# Patient Record
Sex: Male | Born: 1960
Health system: Southern US, Community
[De-identification: ages and names within clinical notes are randomized; demographics above are authoritative.]

## PROBLEM LIST (undated history)

## (undated) DIAGNOSIS — E663 Overweight: Secondary | ICD-10-CM

## (undated) DIAGNOSIS — Z8614 Personal history of Methicillin resistant Staphylococcus aureus infection: Secondary | ICD-10-CM

## (undated) DIAGNOSIS — F419 Anxiety disorder, unspecified: Secondary | ICD-10-CM

## (undated) DIAGNOSIS — M25861 Other specified joint disorders, right knee: Secondary | ICD-10-CM

## (undated) DIAGNOSIS — N200 Calculus of kidney: Secondary | ICD-10-CM

## (undated) DIAGNOSIS — Z87442 Personal history of urinary calculi: Secondary | ICD-10-CM

## (undated) DIAGNOSIS — R972 Elevated prostate specific antigen [PSA]: Secondary | ICD-10-CM

## (undated) DIAGNOSIS — R911 Solitary pulmonary nodule: Secondary | ICD-10-CM

## (undated) DIAGNOSIS — R351 Nocturia: Secondary | ICD-10-CM

## (undated) DIAGNOSIS — M1990 Unspecified osteoarthritis, unspecified site: Secondary | ICD-10-CM

## (undated) DIAGNOSIS — M75100 Unspecified rotator cuff tear or rupture of unspecified shoulder, not specified as traumatic: Secondary | ICD-10-CM

## (undated) DIAGNOSIS — R569 Unspecified convulsions: Secondary | ICD-10-CM

## (undated) DIAGNOSIS — E785 Hyperlipidemia, unspecified: Secondary | ICD-10-CM

## (undated) DIAGNOSIS — N400 Enlarged prostate without lower urinary tract symptoms: Secondary | ICD-10-CM

## (undated) HISTORY — DX: Overweight: E66.3

## (undated) HISTORY — DX: Nocturia: R35.1

## (undated) HISTORY — PX: LITHOTRIPSY: SUR834

## (undated) HISTORY — PX: SHOULDER SURGERY: SHX246

## (undated) HISTORY — DX: Benign prostatic hyperplasia without lower urinary tract symptoms: N40.0

## (undated) HISTORY — DX: Hyperlipidemia, unspecified: E78.5

## (undated) HISTORY — PX: COLONOSCOPY: SHX174

## (undated) HISTORY — DX: Elevated prostate specific antigen (PSA): R97.20

---

## 2004-03-16 ENCOUNTER — Emergency Department: Payer: Self-pay | Admitting: Emergency Medicine

## 2004-03-22 ENCOUNTER — Ambulatory Visit: Payer: Self-pay | Admitting: Urology

## 2004-03-23 ENCOUNTER — Other Ambulatory Visit: Payer: Self-pay

## 2004-03-23 ENCOUNTER — Ambulatory Visit: Payer: Self-pay | Admitting: Urology

## 2004-03-30 ENCOUNTER — Ambulatory Visit: Payer: Self-pay | Admitting: Urology

## 2004-04-21 ENCOUNTER — Ambulatory Visit: Payer: Self-pay | Admitting: Urology

## 2005-11-30 ENCOUNTER — Ambulatory Visit: Payer: Self-pay | Admitting: Internal Medicine

## 2005-11-30 ENCOUNTER — Encounter: Payer: Self-pay | Admitting: Family Medicine

## 2006-01-29 DIAGNOSIS — Z8614 Personal history of Methicillin resistant Staphylococcus aureus infection: Secondary | ICD-10-CM

## 2006-01-29 HISTORY — DX: Personal history of Methicillin resistant Staphylococcus aureus infection: Z86.14

## 2006-04-02 ENCOUNTER — Ambulatory Visit: Payer: Self-pay | Admitting: Family Medicine

## 2007-01-30 DIAGNOSIS — Z8614 Personal history of Methicillin resistant Staphylococcus aureus infection: Secondary | ICD-10-CM

## 2007-01-30 HISTORY — DX: Personal history of Methicillin resistant Staphylococcus aureus infection: Z86.14

## 2007-07-28 ENCOUNTER — Ambulatory Visit: Payer: Self-pay | Admitting: Family Medicine

## 2007-07-28 DIAGNOSIS — H6120 Impacted cerumen, unspecified ear: Secondary | ICD-10-CM

## 2007-07-28 DIAGNOSIS — J0100 Acute maxillary sinusitis, unspecified: Secondary | ICD-10-CM

## 2007-07-29 ENCOUNTER — Encounter: Payer: Self-pay | Admitting: Family Medicine

## 2008-02-06 ENCOUNTER — Emergency Department: Payer: Self-pay | Admitting: Emergency Medicine

## 2008-02-09 ENCOUNTER — Ambulatory Visit: Payer: Self-pay | Admitting: Urology

## 2008-02-23 ENCOUNTER — Ambulatory Visit: Payer: Self-pay | Admitting: Urology

## 2008-03-18 ENCOUNTER — Ambulatory Visit: Payer: Self-pay | Admitting: Urology

## 2008-04-20 ENCOUNTER — Ambulatory Visit: Payer: Self-pay | Admitting: Urology

## 2009-02-10 ENCOUNTER — Ambulatory Visit: Payer: Self-pay | Admitting: Orthopedic Surgery

## 2009-02-17 ENCOUNTER — Ambulatory Visit: Payer: Self-pay | Admitting: Orthopedic Surgery

## 2009-09-06 ENCOUNTER — Encounter (INDEPENDENT_AMBULATORY_CARE_PROVIDER_SITE_OTHER): Payer: Self-pay | Admitting: *Deleted

## 2010-02-28 NOTE — Letter (Signed)
Summary: Nadara Eaton letter  [place] at [facility]  [address] [place], [place] [address]   Phone: [phone]  Fax: [phone]       09/06/2009 MRN: [id number]  [facility] [address] [place], [place]  [address]  Dear Mr. Irena [facility] [place], and [facility] announce the retirement of Arta Silence, M.D., from full-time practice at the [facility] office effective July 28, 2009 and his plans of returning part-time.  It is important to Dr. Hetty Ely and to our practice that you understand that [facility] has seven physicians in our office for your health care needs.  We will continue to offer the same exceptional care that you have today.    Dr. Hetty Ely has spoken to many of you about his plans for retirement and returning part-time in the fall.   We will continue to work with you through the transition to schedule appointments for you in the office and meet the high standards that [place] is committed to.   Again, it is with great pleasure that we share the news that Dr. Hetty Ely will return to [facility] at [facility] in October of 2011 with a reduced schedule.    If you have any questions, or would like to request an appointment with one of our physicians, please call us at [phone] and press the option for Scheduling an appointment.  We take pleasure in providing you with excellent patient care and look forward to seeing you at your next office visit.  Our [facility] Physicians are:  Tillman Abide, M.D. Laurita Quint, M.D. Roxy Manns, M.D. Kerby Nora, M.D. Hannah Beat, M.D. Ruthe Mannan, M.D. We proudly welcomed Raechel Ache, M.D. and Eustaquio Boyden, M.D. to the practice in July/August 2011.  Sincerely,  [facility]

## 2010-04-04 IMAGING — CR DG ABDOMEN 1V
1 series · 1 of 1 positions shown · non-contrast
Comparison: none

REASON FOR EXAM: flank pain
COMMENTS:

[view not recorded]
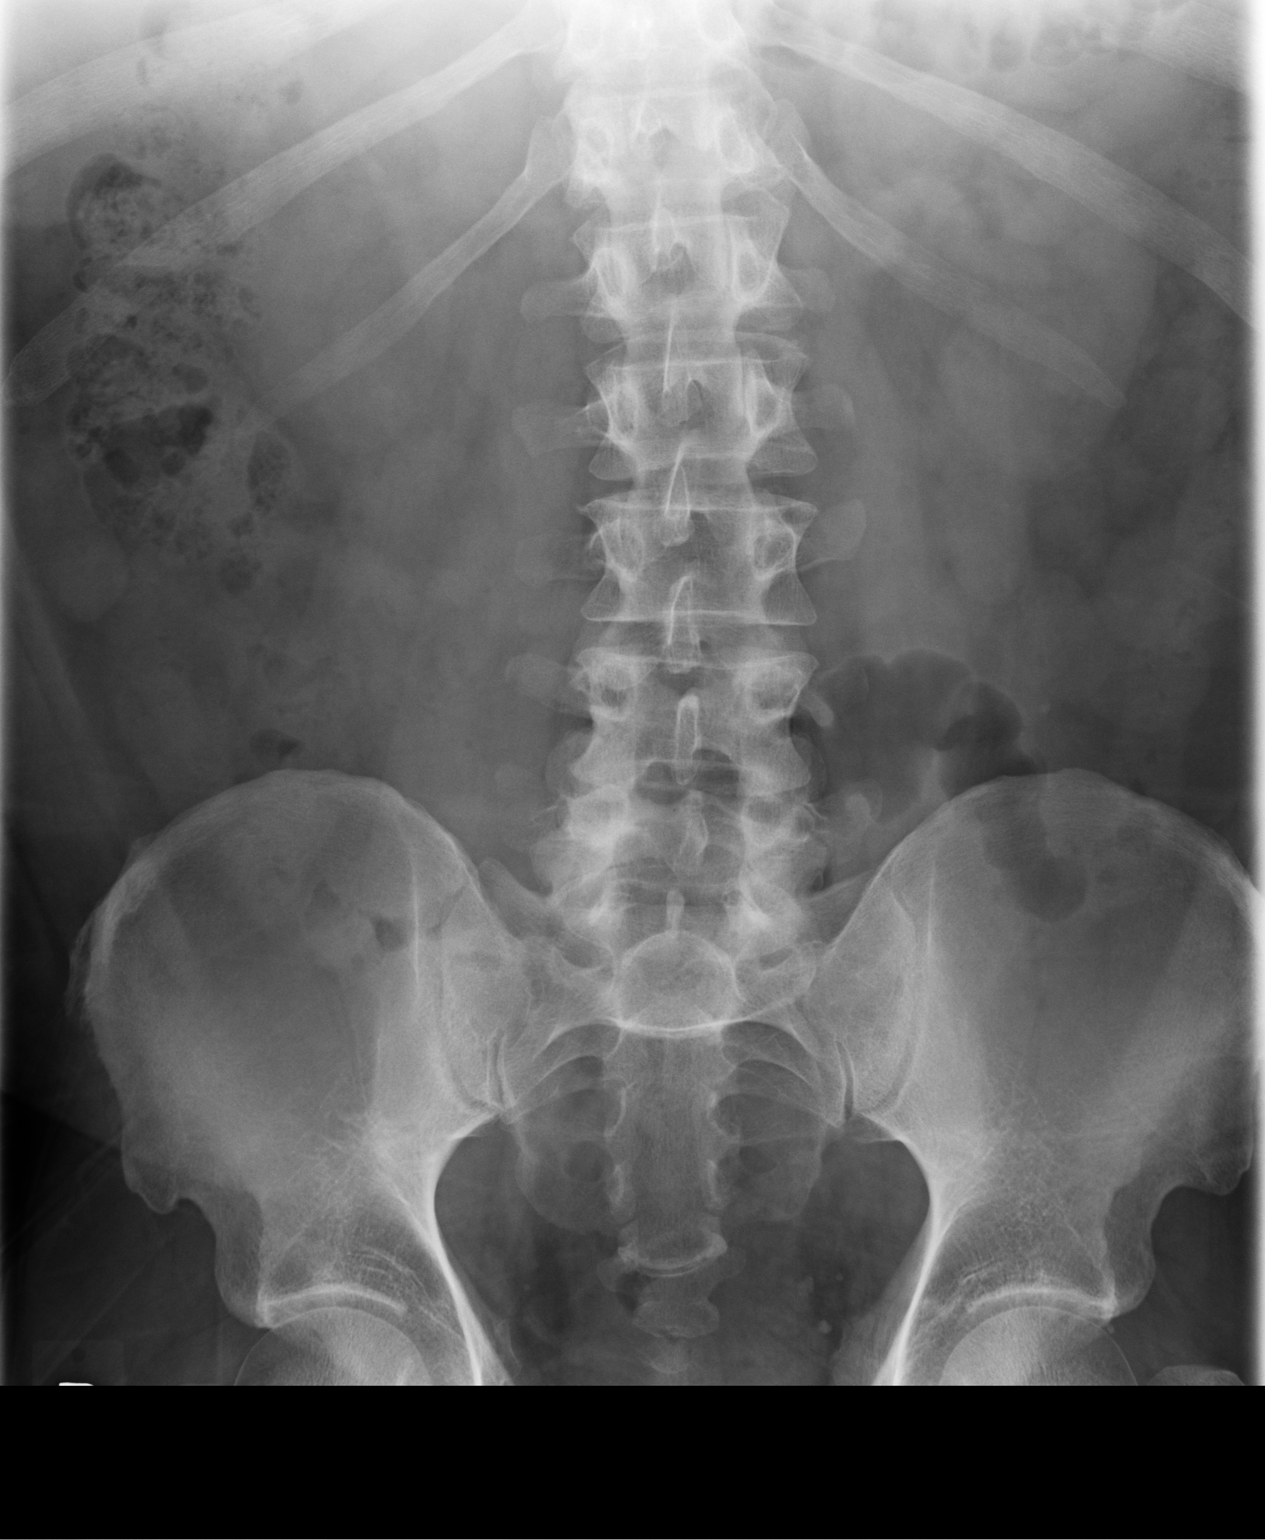

[1 of 1 positions shown; findings below may reference images not displayed]

PROCEDURE:     MDR - MDR KIDNEY URETER BLADDER  - February 09, 2008  [DATE]

RESULT:     The kidneys appear to be symmetrical and normal in size. No
renal calcifications are identified on routine radiography. There is a 6 mm
calcification in the LEFT pelvis suspicious for a distal LEFT ureteral
stone. A few phleboliths are noted in the pelvis bilaterally.
IMPRESSION: 1.     Possible distal LEFT ureteral stone.

## 2012-01-30 LAB — HM COLONOSCOPY

## 2012-07-31 ENCOUNTER — Ambulatory Visit: Payer: Self-pay | Admitting: Unknown Physician Specialty

## 2013-05-04 ENCOUNTER — Emergency Department (HOSPITAL_COMMUNITY): Payer: BC Managed Care – PPO

## 2013-05-04 ENCOUNTER — Observation Stay (HOSPITAL_COMMUNITY)
Admission: EM | Admit: 2013-05-04 | Discharge: 2013-05-05 | Disposition: A | Discharge status: Home / Self Care | Payer: BC Managed Care – PPO | Attending: Internal Medicine | Admitting: Internal Medicine

## 2013-05-04 ENCOUNTER — Encounter (HOSPITAL_COMMUNITY): Payer: Self-pay | Admitting: Emergency Medicine

## 2013-05-04 DIAGNOSIS — Z87442 Personal history of urinary calculi: Secondary | ICD-10-CM | POA: Diagnosis not present

## 2013-05-04 DIAGNOSIS — S4980XA Other specified injuries of shoulder and upper arm, unspecified arm, initial encounter: Secondary | ICD-10-CM | POA: Insufficient documentation

## 2013-05-04 DIAGNOSIS — R748 Abnormal levels of other serum enzymes: Secondary | ICD-10-CM | POA: Diagnosis present

## 2013-05-04 DIAGNOSIS — W19XXXA Unspecified fall, initial encounter: Secondary | ICD-10-CM | POA: Diagnosis not present

## 2013-05-04 DIAGNOSIS — S46909A Unspecified injury of unspecified muscle, fascia and tendon at shoulder and upper arm level, unspecified arm, initial encounter: Secondary | ICD-10-CM | POA: Diagnosis not present

## 2013-05-04 DIAGNOSIS — R569 Unspecified convulsions: Secondary | ICD-10-CM | POA: Diagnosis present

## 2013-05-04 DIAGNOSIS — D72829 Elevated white blood cell count, unspecified: Secondary | ICD-10-CM | POA: Diagnosis not present

## 2013-05-04 DIAGNOSIS — R32 Unspecified urinary incontinence: Secondary | ICD-10-CM | POA: Diagnosis not present

## 2013-05-04 DIAGNOSIS — S01502A Unspecified open wound of oral cavity, initial encounter: Secondary | ICD-10-CM | POA: Insufficient documentation

## 2013-05-04 DIAGNOSIS — R55 Syncope and collapse: Secondary | ICD-10-CM | POA: Diagnosis not present

## 2013-05-04 DIAGNOSIS — X838XXA Intentional self-harm by other specified means, initial encounter: Secondary | ICD-10-CM | POA: Insufficient documentation

## 2013-05-04 DIAGNOSIS — S4992XA Unspecified injury of left shoulder and upper arm, initial encounter: Secondary | ICD-10-CM | POA: Diagnosis present

## 2013-05-04 HISTORY — DX: Calculus of kidney: N20.0

## 2013-05-04 LAB — ETHANOL: Alcohol, Ethyl (B): <11 mg/dL (ref 0–11)

## 2013-05-04 LAB — COMPREHENSIVE METABOLIC PANEL
ALT: 13 U/L (ref 0–53)
AST: 20 U/L (ref 0–37)
Albumin: 3.6 g/dL (ref 3.5–5.2)
Alkaline Phosphatase: 74 U/L (ref 39–117)
BUN: 20 mg/dL (ref 6–23)
CALCIUM: 9.2 mg/dL (ref 8.4–10.5)
CO2: 25 meq/L (ref 19–32)
CREATININE: 1.06 mg/dL (ref 0.50–1.35)
Chloride: 103 mEq/L (ref 96–112)
GFR, EST NON AFRICAN AMERICAN: 79 mL/min — AB (ref >90)
Glucose, Bld: 125 mg/dL — ABNORMAL HIGH (ref 70–99)
Potassium: 4.4 mEq/L (ref 3.7–5.3)
Sodium: 141 mEq/L (ref 137–147)
TOTAL PROTEIN: 7.3 g/dL (ref 6.0–8.3)
Total Bilirubin: 0.3 mg/dL (ref 0.3–1.2)

## 2013-05-04 LAB — CBC
HEMATOCRIT: 39.1 % (ref 39.0–52.0)
HEMOGLOBIN: 13.6 g/dL (ref 13.0–17.0)
MCH: 30.4 pg (ref 26.0–34.0)
MCHC: 34.8 g/dL (ref 30.0–36.0)
MCV: 87.5 fL (ref 78.0–100.0)
Platelets: 193 10*3/uL (ref 150–400)
RBC: 4.47 MIL/uL (ref 4.22–5.81)
RDW: 12.5 % (ref 11.5–15.5)
WBC: 11.7 10*3/uL — ABNORMAL HIGH (ref 4.0–10.5)

## 2013-05-04 LAB — CBG MONITORING, ED: GLUCOSE-CAPILLARY: 111 mg/dL — AB (ref 70–99)

## 2013-05-04 LAB — CK: CK TOTAL: 444 U/L — AB (ref 7–232)

## 2013-05-04 MED ORDER — IBUPROFEN 400 MG PO TABS
400.0000 mg | ORAL_TABLET | Freq: Once | ORAL | Status: AC
  Administered 2013-05-04: 400 mg via ORAL
  Filled 2013-05-04: qty 2

## 2013-05-04 MED ORDER — ACETAMINOPHEN 325 MG PO TABS
650.0000 mg | ORAL_TABLET | ORAL | Status: DC | PRN
  Administered 2013-05-04 – 2013-05-05 (×3): 650 mg via ORAL
  Filled 2013-05-04 (×3): qty 2

## 2013-05-04 MED ORDER — HYDROCODONE-ACETAMINOPHEN 5-325 MG PO TABS: 1.0000 | ORAL_TABLET | ORAL | Status: DC | PRN

## 2013-05-04 MED ORDER — ENOXAPARIN SODIUM 40 MG/0.4ML ~~LOC~~ SOLN
40.0000 mg | Freq: Every day | SUBCUTANEOUS | Status: DC
  Administered 2013-05-04: 40 mg via SUBCUTANEOUS
  Filled 2013-05-04 (×2): qty 0.4

## 2013-05-04 MED ORDER — LORAZEPAM 2 MG/ML IJ SOLN: 1.0000 mg | INTRAMUSCULAR | Status: DC | PRN

## 2013-05-04 NOTE — Consult Note (Signed)
Neurology Consultation Reason for Consult: Seizure Referring Physician: Darlyn ChamberZackowski, S  CC: "I do not remember what happened"  History is obtained from:patient, referring physician  HPI: Mark Hernandez is a 53 y.o. male with no significant past medical history who presents with new onset episode concerning for seizure. He states that he remembers feeling flushed and then the next thing that he knows EMS was there with him. Apparently one of his coworkers found him confused and it was that coworker who called EMS.  He did lose urinary continence and has a bite on the side of his tongue. He somehow injured his left shoulder.    ROS: A 14 point ROS was performed and is negative except as noted in the HPI.  Past Medical History  Diagnosis Date  . Kidney stone     Family History: No history of seizures, sister with infantile brain tumor  Social History: Tob: negative  Exam: Current vital signs: BP 122/84  Pulse 105  Temp(Src) 98.7 F (37.1 C) (Oral)  Resp 18  Ht 5\' 9"  (1.753 m)  Wt 103.103 kg (227 lb 4.8 oz)  BMI 33.55 kg/m2  SpO2 97% Vital signs in last 24 hours: Temp:  [98.3 F (36.8 C)-99 F (37.2 C)] 98.7 F (37.1 C) (04/06 2258) Pulse Rate:  [95-106] 105 (04/06 2258) Resp:  [14-18] 18 (04/06 2258) BP: (118-143)/(82-94) 122/84 mmHg (04/06 2258) SpO2:  [93 %-100 %] 97 % (04/06 2258) Weight:  [103.103 kg (227 lb 4.8 oz)] 103.103 kg (227 lb 4.8 oz) (04/06 2258)  General: in bed, NAD CV: regular rate and rhythm Mental Status: Patient is awake, alert, oriented to person, place, month, year, and situation. Immediate and remote memory are intact. Patient is able to give a clear and coherent history. No signs of aphasia or neglect Cranial Nerves: II: Visual Fields are full. Pupils are equal, round, and reactive to light.  Discs are without papilledema. III,IV, VI: EOMI without ptosis or diploplia.  V: Facial sensation is symmetric to temperature VII: Facial movement is  symmetric.  VIII: hearing is intact to voice X: Uvula elevates symmetrically XI: Shoulder shrug is symmetric. XII: tongue is midline without atrophy or fasciculations.  Motor: Tone is normal. Bulk is normal. 5/5 strength was present in all four extremities.  Sensory: Sensation is symmetric to light touch and temperature in the arms and legs. Deep Tendon Reflexes: 2+ and symmetric in the biceps and patellae.  Plantars: Toes are downgoing bilaterally.  Cerebellar: FNF and HKS are intact bilaterally Gait: Not tested due to multiple medical monitors in ED setting      I have reviewed labs in epic and the results pertinent to this consultation are: CBC-mild leukocytosis CMP-unremarkable  I have reviewed the images obtained:CT head-negative  Impression: 53 year old male with first ever seizure. He has been admitted for further workup. The cardiac syncope could conceivably cause him to lose consciousness, with tongue biting and postictal state, I think that epileptic seizure is  much more likely.  I do not typically recommend starting antiepileptic therapy for first-time seizure. I have discussed with him seizure cautions including  Patient is unable to drive, operate heavy machinery, standing over open flames, perform activities at heights or participate in water activities until release by outpatient physician. This was discussed with the patient who expressed understanding.    Recommendations: 1) EEG 2) MRI brain 3) will continue to follow   Ritta SlotMcNeill Lindell Tussey, MD [facility] [phone]  If 7pm- 7am, please page neurology on call  as listed in [place].

## 2013-05-04 NOTE — ED Notes (Signed)
Admitting MD at BS.  

## 2013-05-04 NOTE — H&P (Signed)
[facility] History and Physical  Patient: Mark Hernandez  ZOX:[id number]  DOB: 02-08-1960  DOS: the patient was seen and examined on 05/04/2013 PCP: No PCP Per Patient  Chief Complaint: Unresponsiveness  HPI: Mark Hernandez is a 53 y.o. male with Past medical history of renal stone. Patient presented with an episode of unresponsiveness. He mentions that since last 3 days he has been under a lot of stress at work and has had sleepless nights. Today while he was sitting at his desk he felt warm, and then felt the need to urinate, but he was walking he probably lost consciousness. The next thing that he remembers is he was on the ground sitting up and people were surrounding him asking question if he was okay. He does not remember falling down. He denies any dizziness lightheadedness vertigo focal neurological deficit chest pain palpitation shortness of breath prior to the event. He does not take any medication for prescription. He denies any similar episode in the past. He had tongue bite with some incontinence of urine. He denies any focal neurological deficit at present. He denies any drug abuse or alcohol abuse he denies any prior head injury or surgery.   The patient is coming from home. And at her baseline Independent for most of his  ADL.  Review of Systems: as mentioned in the history of present illness.  A Comprehensive review of the other systems is negative.  Past Medical History  Diagnosis Date  . Kidney stone    Past Surgical History  Procedure Laterality Date  . Lithotripsy    . Shoulder surgery     Social History:  reports that he has never smoked. He does not have any smokeless tobacco history on file. He reports that he does not drink alcohol. His drug history is not on file.  Allergies  Allergen Reactions  . Penicillins Other (See Comments)    REACTION: as child    History reviewed. No pertinent family history.  Prior to Admission medications   Medication  Sig Start Date End Date Taking? Authorizing Provider  ibuprofen (ADVIL,MOTRIN) 200 MG tablet Take 400 mg by mouth every 6 (six) hours as needed for moderate pain.   Yes Historical Provider, MD  Multiple Vitamin (MULTIVITAMIN WITH MINERALS) TABS tablet Take 1 tablet by mouth daily.   Yes Historical Provider, MD  HYDROcodone-acetaminophen (NORCO/VICODIN) 5-325 MG per tablet Take 1-2 tablets by mouth every 4 (four) hours as needed. 05/04/13   Toney Sang, MD    Physical Exam: Filed Vitals:   05/04/13 2020 05/04/13 2051 05/04/13 2100 05/04/13 2130  BP: 141/94 118/82 120/85 125/90  Pulse: 106 102 106 100  Temp: 99 F (37.2 C)     TempSrc: Oral     Resp: 18 16    SpO2: 96% 94% 93% 94%    General: Alert, Awake and Oriented to Time, Place and Person. Appear in mild distress Eyes: PERRL ENT: Oral Mucosa clear moist. Neck: no JVD Cardiovascular: S1 and S2 Present, no Murmur, Peripheral Pulses Present Respiratory: Bilateral Air entry equal and Decreased, Clear to Auscultation,  no Crackles,no wheezes Abdomen: Bowel Sound Present, Soft and Non tender Skin: no Rash Extremities: no Pedal edema, no calf tenderness, left shoulder tenderness on abduction and external rotation  Neurologic: Grossly Unremarkable.  Labs on Admission:  CBC:  Recent Labs Lab 05/04/13 1652  WBC 11.7*  HGB 13.6  HCT 39.1  MCV 87.5  PLT 193    CMP     Component  Value Date/Time   NA 141 05/04/2013 1652   K 4.4 05/04/2013 1652   CL 103 05/04/2013 1652   CO2 25 05/04/2013 1652   GLUCOSE 125* 05/04/2013 1652   BUN 20 05/04/2013 1652   CREATININE 1.06 05/04/2013 1652   CALCIUM 9.2 05/04/2013 1652   PROT 7.3 05/04/2013 1652   ALBUMIN 3.6 05/04/2013 1652   AST 20 05/04/2013 1652   ALT 13 05/04/2013 1652   ALKPHOS 74 05/04/2013 1652   BILITOT 0.3 05/04/2013 1652   GFRNONAA 79* 05/04/2013 1652   GFRAA >90 05/04/2013 1652    No results found for this basename: LIPASE, AMYLASE,  in the last 168 hours No results found for this basename:  AMMONIA,  in the last 168 hours  No results found for this basename: CKTOTAL, CKMB, CKMBINDEX, TROPONINI,  in the last 168 hours BNP (last 3 results) No results found for this basename: PROBNP,  in the last 8760 hours  Radiological Exams on Admission: Ct Head Wo Contrast  05/04/2013   CLINICAL DATA:  Altered mental status.  EXAM: CT HEAD WITHOUT CONTRAST  TECHNIQUE: Contiguous axial images were obtained from the base of the skull through the vertex without intravenous contrast.  COMPARISON:  None.  FINDINGS: Ventricles are normal in size and configuration.  No parenchymal masses or mass effect. There are no areas of abnormal parenchymal attenuation. No evidence of an infarct.  No extra-axial masses or abnormal fluid collections.  There is no intracranial hemorrhage.  Visualized sinuses and mastoid air cells are clear.  IMPRESSION: Normal unenhanced CT scan of the brain.   Electronically Signed   By: Amie Portland M.D.   On: 05/04/2013 18:56   Dg Shoulder Left  05/04/2013   CLINICAL DATA:  Altered mental status. Possible syncope with trauma.  EXAM: LEFT SHOULDER - 2+ VIEW  COMPARISON:  None.  FINDINGS: There is no evidence of fracture or dislocation. There is no evidence of arthropathy or other focal bone abnormality. Soft tissues are unremarkable.  IMPRESSION: Negative.   Electronically Signed   By: Elberta Fortis M.D.   On: 05/04/2013 19:23    EKG: Independently reviewed. normal EKG, normal sinus rhythm.  Assessment/Plan Principal Problem:   Syncope and collapse Active Problems:   Seizure   1. Syncope and collapse the patient presents with an episode of unresponsiveness.   he does not remember the event, he was confused after the wound, he had tongue bite with some incontinence of urine. He appears to have elevated CPK. With this the patient most likely had an unwitnessed seizure at present current etiology is unclear since his U. tox is normal his laboratory workup is unremarkable, he is CT  of the head is negative as well as his neurological examination does not show any focal deficit.  we will perform further workup including MRI brain with and without contrast. Serial neuro checks we'll monitor on telemetry. Seizure prophylaxis and a lorazepam when necessary.  2. left shoulder pain Patient does complain of left shoulder pain he does not have any focal neurological deficit or vascular deficit. X-ray of the shoulder is negative for any acute fracture. PTOT consultation and pain management.  3.leukocytosis   patient does not have any complaints of infection. Chest x-ray and urine are clear. Most likely stress related we'll continue to monitor  Consults: Neurology appreciate input   DVT Prophylaxis: subcutaneous Heparin Nutrition: N.p.o. pending stroke evaluation   Code Status: Full   Disposition: Admitted to observation in telemetry unit.  Author: Lynden OxfordPranav Kamarii Carton, MD [facility] Pager: [phone] 05/04/2013, 9:42 PM    If 7PM-7AM, please contact night-coverage [email or web address] Password [email or web address]

## 2013-05-04 NOTE — ED Notes (Signed)
Patient transported to CT 

## 2013-05-04 NOTE — ED Notes (Signed)
Pt from fire station, c/o altered mental status, pt was found confused at desk. EMS noted blood at mouth, pt states left should pain. No other injuries noted. Pt received 4 mg zofran pta. Alert at this time, Pt does not recall situation

## 2013-05-04 NOTE — ED Provider Notes (Signed)
Date: 05/04/2013  Rate: 96  Rhythm: normal sinus rhythm  QRS Axis: normal  Intervals: normal  ST/T Wave abnormalities: nonspecific T wave changes  Conduction Disutrbances:none  Narrative Interpretation:   Old EKG Reviewed: none available    Shelda JakesScott W. Vu Liebman, MD 05/04/13 [phone]

## 2013-05-04 NOTE — ED Provider Notes (Signed)
CSN: [id number]     Arrival date & time 05/04/13  1634 History   First MD Initiated Contact with Patient 05/04/13 1757     Chief Complaint  Patient presents with  . Altered Mental Status    HPI 53 year old male presents after suspected seizure.  He has no prior history of seizures. He was at work, sitting a desk chair. He stood up to hang his jacket on the wall when he lost consciousness. He did not have any preceding lightheadedness, blurring of his vision, nausea, chest pain, shortness of breath, palpitations. A coworker called EMS. Coworker reported that he was confused and somnolent after the event. No weight witness the actual event. He bit his tongue. He injured his left shoulder. He had incontinence of bladder. He slowly returned to baseline during EMS transport.  He has no significant prior medical history, although he does not see a primary care physician regularly. He's never had a seizure. He's not on any chronic medications. He's never had a syncopal episode. He has no known heart problems.  No fever, headache, neck pain or stiffness, altered mental status, recent illness, or head injury.  He complains of moderate pain to his left shoulder, worse with rotational movements or flexion. No treatments tried.   Past Medical History  Diagnosis Date  . Kidney stone    Past Surgical History  Procedure Laterality Date  . Lithotripsy    . Shoulder surgery     History reviewed. No pertinent family history. History  Substance Use Topics  . Smoking status: Never Smoker   . Smokeless tobacco: Not on file  . Alcohol Use: No    Review of Systems  Constitutional: Negative for fever and chills.  HENT: Negative for congestion and rhinorrhea.   Eyes: Negative for visual disturbance.  Respiratory: Negative for cough and shortness of breath.   Cardiovascular: Negative for chest pain and leg swelling.  Gastrointestinal: Negative for nausea, vomiting, abdominal pain and diarrhea.   Genitourinary: Negative for dysuria, urgency, frequency, flank pain and difficulty urinating.  Musculoskeletal: Negative for back pain, neck pain and neck stiffness.  Skin: Negative for rash.  Neurological: Negative for syncope, weakness, numbness and headaches.  All other systems reviewed and are negative.      Allergies  Penicillins  Home Medications   Current Outpatient Rx  Name  Route  Sig  Dispense  Refill  . HYDROcodone-acetaminophen (NORCO/VICODIN) 5-325 MG per tablet   Oral   Take 1-2 tablets by mouth every 4 (four) hours as needed.   20 tablet   0    BP 122/84  Pulse 105  Temp(Src) 98.7 F (37.1 C) (Oral)  Resp 18  Ht 5\' 9"  (1.753 m)  Wt 227 lb 4.8 oz (103.103 kg)  BMI 33.55 kg/m2  SpO2 97% Physical Exam  Nursing note and vitals reviewed. Constitutional: He appears well-developed and well-nourished. No distress.  HENT:  Head: Normocephalic and atraumatic.  Mouth/Throat: Oropharynx is clear and moist.  Eyes: Conjunctivae and EOM are normal. Pupils are equal, round, and reactive to light. No scleral icterus.  Neck: Neck supple.  No C-spine tenderness  Cardiovascular: Normal rate, regular rhythm, normal heart sounds and intact distal pulses.  Exam reveals no gallop and no friction rub.   No murmur heard. Pulmonary/Chest: Effort normal and breath sounds normal. No respiratory distress. He has no wheezes. He has no rales. He exhibits no tenderness.  Abdominal: Soft. Bowel sounds are normal. He exhibits no distension. There is no tenderness. There  is no rebound and no guarding.  Musculoskeletal:  Left shoulder is normal to inspection. He has pain with passive and active range of motion with internal and external rotation and with abduction. Shoulder is not clinically dislocated. No bony tenderness. Tenderness over trapezius.  Neurological:  Alert, oriented X 4, GCS 15.  CN 2-12 intact.  5/5 strength in bilateral upper and lower extremities, all major muscle  groups.  Normal sensation to light touch in bilateral upper and lower extremities.  Visual fields intact to confrontation.  Normal gait.  Negative Romberg.  Normal coordination.  Normal finger to nose and heel to shin.  Skin: Skin is warm and dry.  Psychiatric: He has a normal mood and affect.    ED Course  Procedures (including critical care time) Labs Review Labs Reviewed  CBC - Abnormal; Notable for the following:    WBC 11.7 (*)    All other components within normal limits  COMPREHENSIVE METABOLIC PANEL - Abnormal; Notable for the following:    Glucose, Bld 125 (*)    GFR calc non Af Amer 79 (*)    All other components within normal limits  CK - Abnormal; Notable for the following:    Total CK 444 (*)    All other components within normal limits  CBG MONITORING, ED - Abnormal; Notable for the following:    Glucose-Capillary 111 (*)    All other components within normal limits  URINE CULTURE  ETHANOL  URINE RAPID DRUG SCREEN (HOSP PERFORMED)  PROLACTIN  URINALYSIS, ROUTINE W REFLEX MICROSCOPIC   Imaging Review Ct Head Wo Contrast  05/04/2013   CLINICAL DATA:  Altered mental status.  EXAM: CT HEAD WITHOUT CONTRAST  TECHNIQUE: Contiguous axial images were obtained from the base of the skull through the vertex without intravenous contrast.  COMPARISON:  None.  FINDINGS: Ventricles are normal in size and configuration.  No parenchymal masses or mass effect. There are no areas of abnormal parenchymal attenuation. No evidence of an infarct.  No extra-axial masses or abnormal fluid collections.  There is no intracranial hemorrhage.  Visualized sinuses and mastoid air cells are clear.  IMPRESSION: Normal unenhanced CT scan of the brain.   Electronically Signed   By: Amie Portlandavid  Ormond M.D.   On: 05/04/2013 18:56   Dg Shoulder Left  05/04/2013   CLINICAL DATA:  Altered mental status. Possible syncope with trauma.  EXAM: LEFT SHOULDER - 2+ VIEW  COMPARISON:  None.  FINDINGS: There is no evidence  of fracture or dislocation. There is no evidence of arthropathy or other focal bone abnormality. Soft tissues are unremarkable.  IMPRESSION: Negative.   Electronically Signed   By: Elberta Fortisaniel  Boyle M.D.   On: 05/04/2013 19:23     EKG Interpretation None      MDM   53 year old male lost consciousness at work unexpectedly without any preceding symptoms. Bit his tongue. Had urinary incontinence. This was unwitnessed. He was reportedly somnolent and confused afterwards for a period of time and gradually returned to baseline. No preceding cardiac symptoms. He has no history of seizures. No history of cardiac disease. No recent illness. Was feeling well before this happened today. States he has not had sleep in several days due to work and other stressors. Denies any medications. Denies any drug use or alcohol.  CT head without contrast is negative for itch cranial injury. Left shoulder x-ray demonstrates no injury.  Labs pertinent for white blood cell count 11.7, essentially normal complete metabolic panel, normal blood glucose.  I think patient had a seizure, the syncope is not entirely excluded. It was not witnessed.  Discussed this with the patient. I would normally discharge the patient home and that was the plan. I discussed this at length with the patient his wife and advised him that it was not safe to drive and that he needs neurology followup, probably needs an MRI and possibly EEG. 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 The patient was initially extremely resistant to this. He stated that I should not put any of this in his medical history or in his discharge papers and he wanted me to give him an alternative diagnosis. He stated that he would not be following up with a neurologist, would not have an MRI, and I should let him go. He told me that he was afraid that he might lose his job. He  states that the state fires people for such things and this cannot be in his medical record. I voiced concerned the patient that he was not only placing himself in danger but also placing others in danger.  At this point I discussed the alternative of an expedited seizure workup with the patient. He stated that if it would provide any answers he would be open to it. I discussed with neurology on call and they are willing to admit the patient for seizure workup to the hospitalist service and they will follow along. Patient is admitted to hospitalist.  Final diagnoses:  Seizure  Injury of left shoulder      Toney Sang, MD 05/05/13 1610

## 2013-05-04 NOTE — ED Notes (Signed)
Report Attempted

## 2013-05-04 NOTE — ED Notes (Signed)
MD at bedside. 

## 2013-05-04 NOTE — ED Provider Notes (Signed)
I saw and evaluated the patient, reviewed the resident's note and I agree with the findings and plan.   EKG Interpretation None       Date: 05/04/2013  Rate: 96  Rhythm: normal sinus rhythm  QRS Axis: normal  Intervals: normal  ST/T Wave abnormalities: normal  Conduction Disutrbances:none  Narrative Interpretation:   Old EKG Reviewed: none available  Results for orders placed during the hospital encounter of 05/04/13  CBC      Result Value Ref Range   WBC 11.7 (*) 4.0 - 10.5 K/uL   RBC 4.47  4.22 - 5.81 MIL/uL   Hemoglobin 13.6  13.0 - 17.0 g/dL   HCT 13.039.1  86.539.0 - 78.452.0 %   MCV 87.5  78.0 - 100.0 fL   MCH 30.4  26.0 - 34.0 pg   MCHC 34.8  30.0 - 36.0 g/dL   RDW 69.612.5  29.511.5 - 28.415.5 %   Platelets 193  150 - 400 K/uL  COMPREHENSIVE METABOLIC PANEL      Result Value Ref Range   Sodium 141  137 - 147 mEq/L   Potassium 4.4  3.7 - 5.3 mEq/L   Chloride 103  96 - 112 mEq/L   CO2 25  19 - 32 mEq/L   Glucose, Bld 125 (*) 70 - 99 mg/dL   BUN 20  6 - 23 mg/dL   Creatinine, Ser 1.321.06  0.50 - 1.35 mg/dL   Calcium 9.2  8.4 - 44.010.5 mg/dL   Total Protein 7.3  6.0 - 8.3 g/dL   Albumin 3.6  3.5 - 5.2 g/dL   AST 20  0 - 37 U/L   ALT 13  0 - 53 U/L   Alkaline Phosphatase 74  39 - 117 U/L   Total Bilirubin 0.3  0.3 - 1.2 mg/dL   GFR calc non Af Amer 79 (*) >90 mL/min   GFR calc Af Amer >90  >90 mL/min  CBG MONITORING, ED      Result Value Ref Range   Glucose-Capillary 111 (*) 70 - 99 mg/dL   Ct Head Wo Contrast  05/04/2013   CLINICAL DATA:  Altered mental status.  EXAM: CT HEAD WITHOUT CONTRAST  TECHNIQUE: Contiguous axial images were obtained from the base of the skull through the vertex without intravenous contrast.  COMPARISON:  None.  FINDINGS: Ventricles are normal in size and configuration.  No parenchymal masses or mass effect. There are no areas of abnormal parenchymal attenuation. No evidence of an infarct.  No extra-axial masses or abnormal fluid collections.  There is no  intracranial hemorrhage.  Visualized sinuses and mastoid air cells are clear.  IMPRESSION: Normal unenhanced CT scan of the brain.   Electronically Signed   By: Amie Portlandavid  Ormond M.D.   On: 05/04/2013 18:56   Dg Shoulder Left  05/04/2013   CLINICAL DATA:  Altered mental status. Possible syncope with trauma.  EXAM: LEFT SHOULDER - 2+ VIEW  COMPARISON:  None.  FINDINGS: There is no evidence of fracture or dislocation. There is no evidence of arthropathy or other focal bone abnormality. Soft tissues are unremarkable.  IMPRESSION: Negative.   Electronically Signed   By: Elberta Fortisaniel  Boyle M.D.   On: 05/04/2013 19:23    Patient with syncopal episode the incontinence and the tongue bite makes it sound as if it could be a seizure. Patient works for [employer]. Was originally planning neurology outpatient workup and now more concerned about potential syncope and plus patient is stating he  was in the followup. Patient has agreed to stay if the initial workup can be expedited. Neural hospitalist and they were willing to do that and consultation. Will contact the undersigned admission for admission. Patient's head CT without acute findings. Cardiac monitoring will be require just in case it was a arrhythmia as the cause of the syncope. And patient will need MRI and EEG. Patient's EKG here without any significant abnormalities labs without significant abnormalities.       Shelda Jakes, MD 05/04/13 2046

## 2013-05-05 ENCOUNTER — Observation Stay (HOSPITAL_COMMUNITY): Payer: BC Managed Care – PPO

## 2013-05-05 DIAGNOSIS — S4992XA Unspecified injury of left shoulder and upper arm, initial encounter: Secondary | ICD-10-CM | POA: Diagnosis present

## 2013-05-05 DIAGNOSIS — R569 Unspecified convulsions: Secondary | ICD-10-CM

## 2013-05-05 DIAGNOSIS — R55 Syncope and collapse: Secondary | ICD-10-CM | POA: Diagnosis not present

## 2013-05-05 DIAGNOSIS — S4980XA Other specified injuries of shoulder and upper arm, unspecified arm, initial encounter: Secondary | ICD-10-CM

## 2013-05-05 DIAGNOSIS — R748 Abnormal levels of other serum enzymes: Secondary | ICD-10-CM

## 2013-05-05 DIAGNOSIS — S46909A Unspecified injury of unspecified muscle, fascia and tendon at shoulder and upper arm level, unspecified arm, initial encounter: Secondary | ICD-10-CM

## 2013-05-05 LAB — CBC WITH DIFFERENTIAL/PLATELET
BASOS ABS: 0 10*3/uL (ref 0.0–0.1)
Basophils Relative: 0 % (ref 0–1)
EOS ABS: 0.1 10*3/uL (ref 0.0–0.7)
Eosinophils Relative: 1 % (ref 0–5)
HCT: 40.5 % (ref 39.0–52.0)
Hemoglobin: 13.7 g/dL (ref 13.0–17.0)
LYMPHS ABS: 1.9 10*3/uL (ref 0.7–4.0)
Lymphocytes Relative: 18 % (ref 12–46)
MCH: 29.9 pg (ref 26.0–34.0)
MCHC: 33.8 g/dL (ref 30.0–36.0)
MCV: 88.4 fL (ref 78.0–100.0)
Monocytes Absolute: 0.8 10*3/uL (ref 0.1–1.0)
Monocytes Relative: 8 % (ref 3–12)
NEUTROS PCT: 73 % (ref 43–77)
Neutro Abs: 8 10*3/uL — ABNORMAL HIGH (ref 1.7–7.7)
PLATELETS: 184 10*3/uL (ref 150–400)
RBC: 4.58 MIL/uL (ref 4.22–5.81)
RDW: 12.8 % (ref 11.5–15.5)
WBC: 10.9 10*3/uL — AB (ref 4.0–10.5)

## 2013-05-05 LAB — RAPID URINE DRUG SCREEN, HOSP PERFORMED
Amphetamines: NOT DETECTED
Barbiturates: NOT DETECTED
Benzodiazepines: NOT DETECTED
COCAINE: NOT DETECTED
OPIATES: NOT DETECTED
Tetrahydrocannabinol: NOT DETECTED

## 2013-05-05 LAB — COMPREHENSIVE METABOLIC PANEL
ALBUMIN: 3.4 g/dL — AB (ref 3.5–5.2)
ALT: 14 U/L (ref 0–53)
AST: 25 U/L (ref 0–37)
Alkaline Phosphatase: 79 U/L (ref 39–117)
BUN: 18 mg/dL (ref 6–23)
CO2: 22 mEq/L (ref 19–32)
Calcium: 9.1 mg/dL (ref 8.4–10.5)
Chloride: 104 mEq/L (ref 96–112)
Creatinine, Ser: 0.89 mg/dL (ref 0.50–1.35)
GFR calc Af Amer: >90 mL/min (ref >90)
GFR calc non Af Amer: >90 mL/min (ref >90)
GLUCOSE: 97 mg/dL (ref 70–99)
POTASSIUM: 4 meq/L (ref 3.7–5.3)
SODIUM: 142 meq/L (ref 137–147)
TOTAL PROTEIN: 7.2 g/dL (ref 6.0–8.3)
Total Bilirubin: 0.4 mg/dL (ref 0.3–1.2)

## 2013-05-05 LAB — GLUCOSE, CAPILLARY
GLUCOSE-CAPILLARY: 108 mg/dL — AB (ref 70–99)
Glucose-Capillary: 92 mg/dL (ref 70–99)
Glucose-Capillary: 102 mg/dL — ABNORMAL HIGH (ref 70–99)

## 2013-05-05 LAB — URINALYSIS, ROUTINE W REFLEX MICROSCOPIC
Bilirubin Urine: NEGATIVE
Glucose, UA: NEGATIVE mg/dL
Hgb urine dipstick: NEGATIVE
Ketones, ur: NEGATIVE mg/dL
LEUKOCYTES UA: NEGATIVE
NITRITE: NEGATIVE
Protein, ur: NEGATIVE mg/dL
SPECIFIC GRAVITY, URINE: 1.025 (ref 1.005–1.030)
Urobilinogen, UA: 0.2 mg/dL (ref 0.0–1.0)
pH: 6 (ref 5.0–8.0)

## 2013-05-05 LAB — PROLACTIN: PROLACTIN: 3.7 ng/mL (ref 2.1–17.1)

## 2013-05-05 LAB — CK: Total CK: 717 U/L — ABNORMAL HIGH (ref 7–232)

## 2013-05-05 MED ORDER — SODIUM CHLORIDE 0.9 % IV SOLN
INTRAVENOUS | Status: DC
  Administered 2013-05-05: 12:00:00 via INTRAVENOUS
  Administered 2013-05-05: 100 mL/h via INTRAVENOUS

## 2013-05-05 MED ORDER — HYDROCODONE-ACETAMINOPHEN 5-325 MG PO TABS: 1.0000 | ORAL_TABLET | ORAL | Status: DC | PRN

## 2013-05-05 MED ORDER — GADOBENATE DIMEGLUMINE 529 MG/ML IV SOLN
20.0000 mL | Freq: Once | INTRAVENOUS | Status: AC
  Administered 2013-05-05: 20 mL via INTRAVENOUS

## 2013-05-05 NOTE — Evaluation (Signed)
Physical Therapy Evaluation Patient Details Name: Mark Hernandez MRN: [id number] DOB: [birth year] Today's Date: 05/05/2013   History of Present Illness  HPI: Mark Hernandez is a 53 y.o. male with no significant past medical history who presents with new onset episode concerning for seizure. He states that he remembers feeling flushed and then the next thing that he knows EMS was there with him. Apparently one of his coworkers found him confused and it was that coworker who called EMS.  Clinical Impression  Patient with resting LUE shoulder pain TTP, constant 3/10 but unable to perform any range of motion without significant limiting pain.  Mobility not impeded at this time. Recommend ortho follow up BJ:YNWGNFAOre:shoulder.     Follow Up Recommendations Other (comment) (recommend ortho consult ZH:[id number]:shoulder)    Equipment Recommendations  None recommended by PT    Recommendations for Other Services       Precautions / Restrictions Precautions Precautions: Shoulder Type of Shoulder Precautions:  (painful) Required Braces or Orthoses: Sling (for comfort) Restrictions Weight Bearing Restrictions: No      Mobility  Bed Mobility Overal bed mobility: Independent                Transfers Overall transfer level: Independent                  Ambulation/Gait Ambulation/Gait assistance: Independent Ambulation Distance (Feet): 620 Feet            Stairs            Wheelchair Mobility    Modified Rankin (Stroke Patients Only)       Balance Overall balance assessment: Independent                                           Pertinent Vitals/Pain 3/10 to 8/10 shoulder pain Left    Home Living Family/patient expects to be discharged to:: Private residence Living Arrangements: Spouse/significant other;Children Available Help at Discharge: Family Type of Home: House Home Access: Stairs to enter Entrance Stairs-Rails: Can reach both Entrance Stairs-Number  of Steps: 3 Home Layout: Two level;Able to live on main level with bedroom/bathroom Home Equipment: None      Prior Function Level of Independence: Independent               Hand Dominance   Dominant Hand: Right    Extremity/Trunk Assessment   Upper Extremity Assessment:  (left shoulder pain 3/10 at rest limited range due to pain)           Lower Extremity Assessment: Overall WFL for tasks assessed         Communication      Cognition Arousal/Alertness: Awake/alert Behavior During Therapy: WFL for tasks assessed/performed Overall Cognitive Status: Within Functional Limits for tasks assessed                      General Comments      Exercises        Assessment/Plan    PT Assessment Patent does not need any further PT services  PT Diagnosis     PT Problem List    PT Treatment Interventions     PT Goals (Current goals can be found in the Care Plan section) Acute Rehab PT Goals Patient Stated Goal: to go home PT Goal Formulation: No goals set, d/c therapy    Frequency  Barriers to discharge        Co-evaluation               End of Session Equipment Utilized During Treatment: Other (comment) (left arm sling) Activity Tolerance: Patient tolerated treatment well Patient left: in bed (seated EOB heading to bathroom) Nurse Communication: Mobility status         Time: 4098-1191 PT Time Calculation (min): 18 min   Charges:   PT Evaluation $Initial PT Evaluation Tier I: 1 Procedure PT Treatments $Gait Training: 8-22 mins   PT G CodesFabio Asa 05/05/2013, 9:40 AM Charlotte Crumb, PT DPT  [phone]

## 2013-05-05 NOTE — Progress Notes (Signed)
EEG completed; results pending.    

## 2013-05-05 NOTE — Progress Notes (Signed)
Patient is discharged from room 4N24 at this time. Alert and in stable condition. Instructions read to patient and understanding verbalized. IV site d/c'd as well as tele. Left unit via wheelchair with family at side and all belongings.

## 2013-05-05 NOTE — Evaluation (Signed)
Occupational Therapy Evaluation Patient Details Name: Mark Hernandez MRN: [id number] DOB: October 18, 1960 Today's Date: 05/05/2013    History of Present Illness HPI: Mark Hernandez is a 53 y.o. male with no significant past medical history who presents with new onset episode concerning for seizure. He states that he remembers feeling flushed and then the next thing that he knows EMS was there with him. Apparently one of his coworkers found him confused and it was that coworker who called EMS.   Clinical Impression   Pt presents with below problem list. Performed UE exercises during session. Recommended Outpatient OT for followup.     Follow Up Recommendations  Outpatient OT    Equipment Recommendations  None recommended by OT    Recommendations for Other Services       Precautions / Restrictions Precautions Required Braces or Orthoses: Sling (for comfort) Restrictions Weight Bearing Restrictions: No      Mobility Bed Mobility Overal bed mobility: Modified Independent                Transfers Overall transfer level: Modified independent Equipment used: None                  Balance Overall balance assessment: Independent                                          ADL Overall ADL's : Needs assistance/impaired                 Upper Body Dressing : Minimal assistance;Standing;Moderate assistance (doffed sling-supine and started to don in standing)       Toilet Transfer: Modified Independent;Ambulation (bed)           Functional mobility during ADLs: Modified independent General ADL Comments: Spoke with pt about button up shirts and may be easier for him to manage. Told pt heat or ice can be used. Performed exercises during session and told pt to be doing exercises and that this avoids developing frozen shoulder. Discussed sling and to try not to wear it if tolerable. Recommended sitting for dressing and sitting on chair for bathing. Educated  on dressing technique. OT assisted with sling today. Repositioned arm with pillow behind left shoulder and explained this keeps it from rolling back.      Vision                     Perception     Praxis      Pertinent Vitals/Pain Pain in LUE, but not rated. Repositioned.      Hand Dominance Right   Extremity/Trunk Assessment Upper Extremity Assessment Upper Extremity Assessment: LUE deficits/detail LUE Deficits / Details: painful shoulder; Limited AROM shoulder flexion against gravity   Lower Extremity Assessment Lower Extremity Assessment: Overall WFL for tasks assessed       Communication Communication Communication: No difficulties   Cognition Arousal/Alertness: Awake/alert Behavior During Therapy: WFL for tasks assessed/performed Overall Cognitive Status: Within Functional Limits for tasks assessed                     General Comments       Exercises Exercises: Other exercises Other Exercises Other Exercises: Performed Other Exercises: AAROM left shoulder flexion/extension in supine; approximately 10 reps Other Exercises: AAROM left shoulder abduction/adduction in supine; approximately 10 reps Other Exercises: AROM left internal/external rotation in supine; approximately 10  reps Other Exercises: Lap slides and pendulums; attempted wall walks but pt not tolerating   Shoulder Instructions      Home Living Family/patient expects to be discharged to:: Private residence Living Arrangements: Spouse/significant other;Children Available Help at Discharge: Family Type of Home: House Home Access: Stairs to enter [employer] of Steps: 3 Entrance Stairs-Rails: Can reach both Home Layout: Two level;Able to live on main level with bedroom/bathroom               Home Equipment: None          Prior Functioning/Environment Level of Independence: Independent             OT Diagnosis: Acute pain   OT Problem List:  Pain;Impaired UE functional use   OT Treatment/Interventions: Self-care/ADL training;Therapeutic exercise;Patient/family education;Therapeutic activities;DME and/or AE instruction    OT Goals(Current goals can be found in the care plan section) Acute Rehab OT Goals Patient Stated Goal: go home OT Goal Formulation: With patient Time For Goal Achievement: 05/12/13 Potential to Achieve Goals: Good ADL Goals Additional ADL Goal #1: Pt will independently perform HEP for LUE. Additional ADL Goal #2: Pt/caregiver will independently don/doff sling and shirt.   OT Frequency: Min 2X/week   Barriers to D/C:            Co-evaluation              End of Session Equipment Utilized During Treatment: Other (comment) (sling) Nurse Communication: Other (comment) (pain meds)  Activity Tolerance: Patient limited by pain Patient left: in bed;with call bell/phone within reach   Time: 1125-1144 OT Time Calculation (min): 19 min Charges:  OT General Charges $OT Visit: 1 Procedure OT Evaluation $Initial OT Evaluation Tier I: 1 Procedure OT Treatments $Therapeutic Exercise: 8-22 mins G-Codes: OT G-codes **NOT FOR INPATIENT CLASS** Functional Assessment Tool Used: clinical judgment Functional Limitation: Self care Self Care Current Status (Z6109): At least 1 percent but less than 20 percent impaired, limited or restricted Self Care Goal Status (U0454): 0 percent impaired, limited or restricted  Earlie Raveling OTR/L [phone] 05/05/2013, 12:39 PM

## 2013-05-05 NOTE — Discharge Summary (Signed)
Physician Discharge Summary  Mark Hernandez:[id number] DOB: 10-Jun-1960 DOA: 05/04/2013  PCP: No PCP Per Patient  Admit date: 05/04/2013 Discharge date: 05/05/2013  Time spent: 25 minutes  Recommendations for Outpatient Follow-up:  1. Home with outpt neurology follow up  --patient recommended to avoid driving, operate heavy machinery, standing over open flames, perform activities at heights or participate in water activities until release by outpatient neurology. 2. Follow up in ED in 4-5 days if left shoulder pain unimproved or worsen with limited ROM.  Discharge Diagnoses:  Principal Problem:   Syncope and collapse  Active Problems:   Possible Seizure   Injury of left shoulder   Elevated CPK   Discharge Condition: fair  Diet recommendation: regular  Filed Weights   05/04/13 2258  Weight: 103.103 kg (227 lb 4.8 oz)    History of present illness:  Mark Hernandez is a 53 y.o. male with Past medical history of renal stone.  Patient presented with an episode of unresponsiveness. He mentions that since last 3 days he has been under a lot of stress at work and has had sleepless nights. Today while he was sitting at his desk he felt warm, and then felt the need to urinate, but he was walking he probably lost consciousness. The next thing that he remembers is he was on the ground sitting up and people were surrounding him asking question if he was okay. He does not remember falling down. He denies any dizziness lightheadedness vertigo focal neurological deficit chest pain palpitation shortness of breath prior to the event.  He does not take any medication for prescription.  He denies any similar episode in the past.  He had tongue bite with some incontinence of urine.  He denies any focal neurological deficit at present.  He denies any drug abuse or alcohol abuse he denies any prior head injury or surgery.    Patient noted to have laceration of his right side of the tongue. Vitals were  stable on admission. Labs unremarkable except for elevated CPK in 400s. Admission head CT unremarkable. admitted to neuro tele.    Hospital Course:  Syncope vs seizure stable on telemetry  no neurological deficit Admission head CT negative. No further symptoms. CPK further elevated to 700s . Given IV fluids EEG negative for seizure activity. Given possible first onset seizure no AED was started. Patient instructed about warning signs of seizure. Also recommended to avoid driving, operate heavy machinery, standing over open flames, perform activities at heights or participate in water activities until release by outpatient neurology. If MRI brain is unremarkable patient can be discharged home with outpt neurology follow up. appt scheduled for 06/02/2013 at 1:30 pm with Dr Karel Jarvis.  Elevated CPK  encouraged increased fluid intake for hydration  Left shoulder pain  secondary to trauma. No fracture noted on x ray. Sling applied. Will prescribe pain  meds. instructed to return to ED if increased swelling or worsened pain noted or symptoms unimproved in next 4-5 days.   Procedures: EEG/ MRI Consultations:  neurology  Discharge Exam: Filed Vitals:   05/05/13 1617  BP: 119/80  Pulse: 88  Temp: 98.1 F (36.7 C)  Resp: 18    General: middle aged male in NAD HEENT: no pallor, moist mucosa, laceration of right side of tongue Chest: clear b/l  CVS: N S1&S2, no murmurs, rubs or gallop Abd: soft, NT, ND, BS+ Ext: warm, limited ROM of left shoulder, no swelling or deformity. CNS: AAOX3. Non focal  Discharge Instructions You were cared for by a hospitalist during your hospital stay. If you have any questions about your discharge medications or the care you received while you were in the hospital after you are discharged, you can call the unit and asked to speak with the hospitalist on call if the hospitalist that took care of you is not available. Once you are discharged, your primary  care physician will handle any further medical issues. Please note that NO REFILLS for any discharge medications will be authorized once you are discharged, as it is imperative that you return to your primary care physician (or establish a relationship with a primary care physician if you do not have one) for your aftercare needs so that they can reassess your need for medications and monitor your lab values.   Future Appointments Provider Department Dept Phone   06/02/2013 2:00 PM Van Clines, MD [facility] Neurology [facility] [phone]       Medication List    STOP taking these medications       ibuprofen 200 MG tablet  Commonly known as:  ADVIL,MOTRIN      TAKE these medications       HYDROcodone-acetaminophen 5-325 MG per tablet  Commonly known as:  NORCO/VICODIN  Take 1-2 tablets by mouth every 4 (four) hours as needed.     multivitamin with minerals Tabs tablet  Take 1 tablet by mouth daily.       Allergies  Allergen Reactions  . Penicillins Other (See Comments)    REACTION: as child       Follow-up Information   Follow up with Gates Rigg, MD. Schedule an appointment as soon as possible for a visit in 2 days. (for reevaluation at next available appointment for new onset seizure)    Specialties:  Neurology, Radiology   Contact information:   [address] [place] [address] [phone]       Follow up with Van Clines, MD On 06/02/2013. (@ 1;30 pm)    Specialty:  Neurology   Contact information:   [address] [place] [address] [phone]        The results of significant diagnostics from this hospitalization (including imaging, microbiology, ancillary and laboratory) are listed below for reference.    Significant Diagnostic Studies: Ct Head Wo Contrast  05/04/2013   CLINICAL DATA:  Altered mental status.  EXAM: CT HEAD WITHOUT CONTRAST  TECHNIQUE: Contiguous axial images were obtained from the base of the skull through  the vertex without intravenous contrast.  COMPARISON:  None.  FINDINGS: Ventricles are normal in size and configuration.  No parenchymal masses or mass effect. There are no areas of abnormal parenchymal attenuation. No evidence of an infarct.  No extra-axial masses or abnormal fluid collections.  There is no intracranial hemorrhage.  Visualized sinuses and mastoid air cells are clear.  IMPRESSION: Normal unenhanced CT scan of the brain.   Electronically Signed   By: Amie Portland M.D.   On: 05/04/2013 18:56   Dg Shoulder Left  05/04/2013   CLINICAL DATA:  Altered mental status. Possible syncope with trauma.  EXAM: LEFT SHOULDER - 2+ VIEW  COMPARISON:  None.  FINDINGS: There is no evidence of fracture or dislocation. There is no evidence of arthropathy or other focal bone abnormality. Soft tissues are unremarkable.  IMPRESSION: Negative.   Electronically Signed   By: Elberta Fortis M.D.   On: 05/04/2013 19:23    Microbiology: No results found for this or  any previous visit (from the past 240 hour(s)).   Labs: Basic Metabolic Panel:  Recent Labs Lab 05/04/13 1652 05/05/13 0740  NA 141 142  K 4.4 4.0  CL 103 104  CO2 25 22  GLUCOSE 125* 97  BUN 20 18  CREATININE 1.06 0.89  CALCIUM 9.2 9.1   Liver Function Tests:  Recent Labs Lab 05/04/13 1652 05/05/13 0740  AST 20 25  ALT 13 14  ALKPHOS 74 79  BILITOT 0.3 0.4  PROT 7.3 7.2  ALBUMIN 3.6 3.4*   No results found for this basename: LIPASE, AMYLASE,  in the last 168 hours No results found for this basename: AMMONIA,  in the last 168 hours CBC:  Recent Labs Lab 05/04/13 1652 05/05/13 0740  WBC 11.7* 10.9*  NEUTROABS  --  8.0*  HGB 13.6 13.7  HCT 39.1 40.5  MCV 87.5 88.4  PLT 193 184   Cardiac Enzymes:  Recent Labs Lab 05/04/13 2150 05/05/13 0740  CKTOTAL 444* 717*   BNP: BNP (last 3 results) No results found for this basename: PROBNP,  in the last 8760 hours CBG:  Recent Labs Lab 05/04/13 1729 05/05/13 0705  05/05/13 1137 05/05/13 1616  GLUCAP 111* 92 102* 108*       Signed:  Cinch Ormond  [facility] 05/05/2013, 5:41 PM

## 2013-05-05 NOTE — Progress Notes (Signed)
UR completed 

## 2013-05-05 NOTE — Progress Notes (Signed)
PT Cancellation Note  Patient Details Name: Mark Hernandez MRN: [id number] DOB: 20-Oct-1960   Cancelled Treatment:    Reason Eval/Treat Not Completed: Patient at procedure or test/unavailable (Patient having EEG study performed will try back)   Fabio AsaWerner, Carrye Goller J 05/05/2013, 9:08 AM Charlotte Crumbevon Lindsea Olivar, PT DPT  [phone]

## 2013-05-05 NOTE — Discharge Instructions (Addendum)
Seizure, Adult A seizure is abnormal electrical activity in the brain. Seizures usually last from 30 seconds to 2 minutes. There are various types of seizures. Before a seizure, you may have a warning sensation (aura) that a seizure is about to occur. An aura may include the following symptoms:   Fear or anxiety.  Nausea.  Feeling like the room is spinning (vertigo).  Vision changes, such as seeing flashing lights or spots. Common symptoms during a seizure include:  A change in attention or behavior (altered mental status).  Convulsions with rhythmic jerking movements.  Drooling.  Rapid eye movements.  Grunting.  Loss of bladder and bowel control.  Bitter taste in the mouth.  Tongue biting. After a seizure, you may feel confused and sleepy. You may also have an injury resulting from convulsions during the seizure. HOME CARE INSTRUCTIONS   If you are given medicines, take them exactly as prescribed by your health care provider.  Keep all follow-up appointments as directed by your health care provider.  Do not swim or drive or engage in risky activity during which a seizure could cause further injury to you or others until your health care provider says it is OK.  Get adequate rest.  Teach friends and family what to do if you have a seizure. They should:  Lay you on the ground to prevent a fall.  Put a cushion under your head.  Loosen any tight clothing around your neck.  Turn you on your side. If vomiting occurs, this helps keep your airway clear.  Stay with you until you recover.  Know whether or not you need emergency care. SEEK IMMEDIATE MEDICAL CARE IF:  The seizure lasts longer than 5 minutes.  The seizure is severe or you do not wake up immediately after the seizure.  You have an altered mental status after the seizure.  You are having more frequent or worsening seizures. Someone should drive you to the emergency department or call local emergency  services (911 in [place] MAKE SURE YOU:  Understand these instructions.  Will watch your condition.  Will get help right away if you are not doing well or get worse. Document Released: 01/13/2000 Document Revised: 11/05/2012 Document Reviewed: 08/27/2012 [facility] Patient Information 2014 [place], [place].  Recommendations:  patient recommended not to drive, operate heavy machinery, standing over open flames, perform activities at heights or participate in water activities until release by outpatient neurology.

## 2013-05-05 NOTE — Procedures (Signed)
ELECTROENCEPHALOGRAM REPORT  Patient: Mark Hernandez       Room #: [phone] EEG No. ID: [id number] Age: 53 y.o.        Sex: male Referring Physician: Dhungel Report Date:  05/05/2013        Interpreting Physician: Aline BrochureSTEWART,Khari Mally R  History: Mark Hernandez is an 53 y.o. male who was brought to the emergency room after being found confused with urinary incontinence and patient had bitten his tongue. He also suffered an injury to his left shoulder.  Indications for study:  Rule out seizure disorder.  Technique: This is an 18 channel routine scalp EEG performed at the bedside with bipolar and monopolar montages arranged in accordance to the international 10/20 system of electrode placement.   Description: This EEG recording was performed during wakefulness. Predominant background activity consists of 9 Hz symmetrical alpha rhythm which attenuated well with eye opening. Photic stimulation produced a symmetrical occipital driving response. Hyperventilation produced normal symmetrical generalized transient slowing response. No epileptiform discharges are recorded, including during 2 separate occurrences of hyperventilation. There was no abnormal slowing of cerebral activity.  Interpretation: This is a normal EEG during wakefulness. No evidence of an epileptic disorder was demonstrated.   Venetia MaxonR Jendayi Berling M.D. [facility] [phone]

## 2013-05-05 NOTE — Progress Notes (Signed)
Subjective: patient is alert and oriented. distressed about the driving restrictions.   Objective: Current vital signs: BP 111/57  Pulse 75  Temp(Src) 97.6 F (36.4 C) (Oral)  Resp 18  Ht 5\' 9"  (1.753 m)  Wt 103.103 kg (227 lb 4.8 oz)  BMI 33.55 kg/m2  SpO2 100% Vital signs in last 24 hours: Temp:  [97.6 F (36.4 C)-99 F (37.2 C)] 97.6 F (36.4 C) (04/07 0548) Pulse Rate:  [75-106] 75 (04/07 0548) Resp:  [14-18] 18 (04/07 0548) BP: (111-143)/(57-94) 111/57 mmHg (04/07 0548) SpO2:  [93 %-100 %] 100 % (04/07 0548) Weight:  [103.103 kg (227 lb 4.8 oz)] 103.103 kg (227 lb 4.8 oz) (04/06 2258)  Intake/Output from previous day: 04/06 0701 - 04/07 0700 In: 495 [I.V.:495] Out: -  Intake/Output this shift:   Nutritional status: NPO  Neurologic Exam: Mental Status: Alert, oriented, thought content appropriate.  Speech fluent without evidence of aphasia.  Able to follow 3 step commands without difficulty. Cranial Nerves: II: Discs flat bilaterally; Visual fields grossly normal, pupils equal, round, reactive to light and accommodation III,IV, VI: ptosis not present, extra-ocular motions intact bilaterally V,VII: smile symmetric, facial light touch sensation normal bilaterally VIII: hearing normal bilaterally IX,X: gag reflex present XI: bilateral shoulder shrug XII: midline tongue extension without atrophy or fasciculations  Motor: Right : Upper extremity   5/5    Left:     Upper extremity   5/5  Lower extremity   5/5     Lower extremity   5/5 Tone and bulk:normal tone throughout; no atrophy noted Sensory: Pinprick and light touch intact throughout, bilaterally Deep Tendon Reflexes:  Right: Upper Extremity   Left: Upper extremity   biceps (C-5 to C-6) 2/4   biceps (C-5 to C-6) 2/4 tricep (C7) 2/4    triceps (C7) 2/4 Brachioradialis (C6) 2/4  Brachioradialis (C6) 2/4  Lower Extremity Lower Extremity  quadriceps (L-2 to L-4) 2/4   quadriceps (L-2 to L-4) 2/4 Achilles  (S1) 2/4   Achilles (S1) 2/4  Plantars: Right: downgoing   Left: downgoing Cerebellar: normal finger-to-nose,  normal heel-to-shin test CV: pulses palpable throughout    Lab Results: Basic Metabolic Panel:  Recent Labs Lab 05/04/13 1652 05/05/13 0740  NA 141 142  K 4.4 4.0  CL 103 104  CO2 25 22  GLUCOSE 125* 97  BUN 20 18  CREATININE 1.06 0.89  CALCIUM 9.2 9.1    Liver Function Tests:  Recent Labs Lab 05/04/13 1652 05/05/13 0740  AST 20 25  ALT 13 14  ALKPHOS 74 79  BILITOT 0.3 0.4  PROT 7.3 7.2  ALBUMIN 3.6 3.4*   No results found for this basename: LIPASE, AMYLASE,  in the last 168 hours No results found for this basename: AMMONIA,  in the last 168 hours  CBC:  Recent Labs Lab 05/04/13 1652 05/05/13 0740  WBC 11.7* 10.9*  NEUTROABS  --  8.0*  HGB 13.6 13.7  HCT 39.1 40.5  MCV 87.5 88.4  PLT 193 184    Cardiac Enzymes:  Recent Labs Lab 05/04/13 2150 05/05/13 0740  CKTOTAL 444* 717*    Lipid Panel: No results found for this basename: CHOL, TRIG, HDL, CHOLHDL, VLDL, LDLCALC,  in the last 168 hours  CBG:  Recent Labs Lab 05/04/13 1729 05/05/13 0705  GLUCAP 111* 92    Microbiology: No results found for this or any previous visit.  Coagulation Studies: No results found for this basename: LABPROT, INR,  in the last 72 hours  Imaging: Ct Head Wo Contrast  05/04/2013   CLINICAL DATA:  Altered mental status.  EXAM: CT HEAD WITHOUT CONTRAST  TECHNIQUE: Contiguous axial images were obtained from the base of the skull through the vertex without intravenous contrast.  COMPARISON:  None.  FINDINGS: Ventricles are normal in size and configuration.  No parenchymal masses or mass effect. There are no areas of abnormal parenchymal attenuation. No evidence of an infarct.  No extra-axial masses or abnormal fluid collections.  There is no intracranial hemorrhage.  Visualized sinuses and mastoid air cells are clear.  IMPRESSION: Normal unenhanced CT  scan of the brain.   Electronically Signed   By: Amie Portlandavid  Ormond M.D.   On: 05/04/2013 18:56   Dg Shoulder Left  05/04/2013   CLINICAL DATA:  Altered mental status. Possible syncope with trauma.  EXAM: LEFT SHOULDER - 2+ VIEW  COMPARISON:  None.  FINDINGS: There is no evidence of fracture or dislocation. There is no evidence of arthropathy or other focal bone abnormality. Soft tissues are unremarkable.  IMPRESSION: Negative.   Electronically Signed   By: Elberta Fortisaniel  Boyle M.D.   On: 05/04/2013 19:23    Medications:  Scheduled: . enoxaparin (LOVENOX) injection  40 mg Subcutaneous QHS   EEG Interpretation: This is a normal EEG during wakefulness. No evidence of an epileptic disorder was demonstrated   Assessment/Plan: 53 YO male with likely first seizure after having two nights of sleep deprivation. He states he only slept for one hour in the last two nights. EEG is negative.  MRI is pending. Patient is unable to drive, operate heavy machinery, standing over open flames, perform activities at heights or participate in water activities until release by outpatient physician. This was discussed with the patient who expressed understanding.   Recommend: 1) MRI brain Pending 2) restrictions as above 3) out patient follow up with neurology    LOS: 1 day   05/05/2013  10:34 AM

## 2013-05-06 LAB — URINE CULTURE
COLONY COUNT: NO GROWTH
Culture: NO GROWTH

## 2013-05-07 ENCOUNTER — Encounter: Payer: Self-pay | Admitting: Neurology

## 2013-05-07 ENCOUNTER — Ambulatory Visit (INDEPENDENT_AMBULATORY_CARE_PROVIDER_SITE_OTHER): Payer: BC Managed Care – PPO | Admitting: Neurology

## 2013-05-07 VITALS — BP 131/84 | HR 109 | Ht 70.5 in | Wt 225.0 lb

## 2013-05-07 DIAGNOSIS — G4700 Insomnia, unspecified: Secondary | ICD-10-CM

## 2013-05-07 NOTE — Progress Notes (Signed)
[facility]    Provider:  Dr Hosie PoissonSumner Referring Provider: Eddie Northhungel, Nishant, MD Primary Care Physician:  No PCP Per Patient  CC:  seizure  HPI:  Jolayne Pantherony G Mcavoy is a 53 y.o. male here as a referral from Dr. Gonzella Lexhungel for question of new onset seizure. Admitted to the hospital on 05/04/2013 with event concerning for seizure vs syncope. Had normal EEG and brain MRI (reviewed). Returns today for outpatient follow up. He recalls standing up, feeling flushed/hot, next thing he recalls is people asking him if he was ok. Friends found him disoriented, desk was disorganized, they note he was walking unsteady. Notes he injured his shoulder, bit his tongue and had loss of urinary control. No recent fevers, illnesses. Had lack of sleep for 3 days prior to the events. Notes having 3 ticks stuck on him yesterday, had recently been out in the woods. Has not noted any rash. No prior episode of seizures or syncope. Light social EtOH usage.    ED neurology consult: Jolayne Pantherony G Glotfelty is a 53 y.o. male with no significant past medical history who presents with new onset episode concerning for seizure. He states that he remembers feeling flushed and then the next thing that he knows EMS was there with him. Apparently one of his coworkers found him confused and it was that coworker who called EMS.  He did lose urinary continence and has a bite on the side of his tongue. He somehow injured his left shoulder.   MRI brain reviewed and was found to be unremarkable.   Review of Systems: Out of a complete 14 system review, the patient complains of only the following symptoms, and all other reviewed systems are negative. + joint pain, passing out  History   Social History  . Marital Status: Single    Spouse Name: N/A    Number of Children: N/A  . Years of Education: N/A   Occupational History  . Not on file.   Social History Main Topics  . Smoking status: Never Smoker   . Smokeless tobacco: Former NeurosurgeonUser      Types: Chew    Quit date: 05/07/2004  . Alcohol Use: Yes     Comment: occ  . Drug Use: No  . Sexual Activity: Not on file   Other Topics Concern  . Not on file   Social History Narrative   Married, 2 children   Right handed   Associate's degree   2-3 per week    No family history on file.  Past Medical History  Diagnosis Date  . Kidney stone     Past Surgical History  Procedure Laterality Date  . Lithotripsy    . Shoulder surgery      Current Outpatient Prescriptions  Medication Sig Dispense Refill  . HYDROcodone-acetaminophen (NORCO/VICODIN) 5-325 MG per tablet Take 1-2 tablets by mouth every 4 (four) hours as needed.  30 tablet  0  . Multiple Vitamin (MULTIVITAMIN WITH MINERALS) TABS tablet Take 1 tablet by mouth daily.       No current facility-administered medications for this visit.    Allergies as of 05/07/2013 - Review Complete 05/07/2013  Allergen Reaction Noted  . Penicillins Other (See Comments) 07/28/2007    Vitals: BP 131/84  Pulse 109  Ht 5' 10.5" (1.791 m)  Wt 225 lb (102.059 kg)  BMI 31.82 kg/m2 Last Weight:  Wt Readings from Last 1 Encounters:  05/07/13 225 lb (102.059 kg)   Last Height:   Ht Readings from  Last 1 Encounters:  05/07/13 5' 10.5" (1.791 m)     Physical exam: Exam: Gen: NAD, conversant Eyes: anicteric sclerae, moist conjunctivae HENT: Atraumatic, oropharynx clear Neck: Trachea midline; supple,  Lungs: CTA, no wheezing, rales, rhonic                          CV: RRR, no MRG Abdomen: Soft, non-tender;  Extremities: No peripheral edema  Skin: Normal temperature, no rash,  Psych: Appropriate affect, pleasant  Neuro: MS: AA&Ox3, appropriately interactive, normal affect   Speech: fluent w/o paraphasic error  Memory: good recent and remote recall  CN: PERRL, EOMI no nystagmus, no ptosis, sensation intact to LT V1-V3 bilat, face symmetric, no weakness, hearing grossly intact, palate elevates symmetrically,  shoulder shrug 5/5 bilat,  tongue protrudes midline, no fasiculations noted.  Motor: normal bulk and tone Strength: 5/5  In all extremities  Coord: rapid alternating and point-to-point (FNF, HTS) movements intact.  Reflexes: symmetrical, bilat downgoing toes  Sens: LT intact in all extremities  Gait: posture, stance, stride and arm-swing normal. Tandem gait intact. Able to walk on heels and toes. Romberg absent.   Assessment:  After physical and neurologic examination, review of laboratory studies, imaging, neurophysiology testing and pre-existing records, assessment will be reviewed on the problem list.  Plan:  Treatment plan and additional workup will be reviewed under Problem List.  1)seizure 2)Insomnia  52y/o gentleman presenting for initial evaluation of episode of LOC concerning for seizure vs syncope. Based on clinical description suspect this likely represents a seizure episode. Suspect it is likely provoked due to lack of sleep x 3 days and stress/anxiety. Based on normal MRI, normal neuro exam and normal EEG he has a low likelihood of having another seizure. No AED indicated at this point. Will prescribe melatonin 5-10mg  nightly for insomnia. Counseled patient to avoid driving for now, he will contact office in 1 month, if no further seizure activity will discuss clearing for driving as this is likely a provoked event.   Elspeth Cho, DO  [facility] [address] [place], [place] [address]  Phone [phone] Fax [phone]

## 2013-06-02 ENCOUNTER — Ambulatory Visit: Payer: BC Managed Care – PPO | Admitting: Neurology

## 2013-06-29 ENCOUNTER — Telehealth: Payer: Self-pay | Admitting: Neurology

## 2013-06-29 NOTE — Telephone Encounter (Signed)
Pt calling to inform Dr. Hosie Poisson, as requested that the pt is doing well, staying hydrated and sleeping well also. Pt would like for Dr. Hosie Poisson to give him a call back, pt has some questions. Please advise

## 2013-06-29 NOTE — Telephone Encounter (Signed)
Returned patients call, he is doing well overall, has improved his oral intake. No further episodes. He will follow up as needed as there is no further neurological workup at this time.

## 2013-06-29 NOTE — Telephone Encounter (Signed)
Patient calling to update Dr Hosie Poisson, as requested how he was doing. He stated he's staying hydrated, sleeping well, all in all doing great.  He's requesting Dr Hosie Poisson to return call has a few questions.

## 2013-09-03 NOTE — Telephone Encounter (Signed)
Noted  

## 2013-09-07 ENCOUNTER — Ambulatory Visit (INDEPENDENT_AMBULATORY_CARE_PROVIDER_SITE_OTHER): Payer: BC Managed Care – PPO | Admitting: Family Medicine

## 2013-09-07 ENCOUNTER — Encounter: Payer: Self-pay | Admitting: Family Medicine

## 2013-09-07 VITALS — BP 134/92 | HR 79 | Temp 98.5°F | Wt 223.0 lb

## 2013-09-07 DIAGNOSIS — M25512 Pain in left shoulder: Secondary | ICD-10-CM

## 2013-09-07 DIAGNOSIS — S4992XD Unspecified injury of left shoulder and upper arm, subsequent encounter: Secondary | ICD-10-CM

## 2013-09-07 DIAGNOSIS — M25519 Pain in unspecified shoulder: Secondary | ICD-10-CM

## 2013-09-07 DIAGNOSIS — Z566 Other physical and mental strain related to work: Secondary | ICD-10-CM

## 2013-09-07 DIAGNOSIS — Z569 Unspecified problems related to employment: Secondary | ICD-10-CM

## 2013-09-07 DIAGNOSIS — Z5189 Encounter for other specified aftercare: Secondary | ICD-10-CM

## 2013-09-07 DIAGNOSIS — S46909A Unspecified injury of unspecified muscle, fascia and tendon at shoulder and upper arm level, unspecified arm, initial encounter: Secondary | ICD-10-CM

## 2013-09-07 DIAGNOSIS — S4980XA Other specified injuries of shoulder and upper arm, unspecified arm, initial encounter: Secondary | ICD-10-CM

## 2013-09-07 MED ORDER — ESCITALOPRAM OXALATE 10 MG PO TABS: 10.0000 mg | ORAL_TABLET | Freq: Every day | ORAL | Status: DC

## 2013-09-07 NOTE — Patient Instructions (Signed)
Shirlee LimerickMarion will call about your referral. Start 10mg  of lexapro a day.  Update me in a few weeks, sooner if needed.  Take care.  Glad to see you.

## 2013-09-07 NOTE — Progress Notes (Signed)
Pre visit review using our clinic review tool, if applicable. No additional management support is needed unless otherwise documented below in the visit note.  He was released by neuro after the prev hospitalization. 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  Minimal etoh.  He still enjoys the work but it isn't an [employer] job.  Melatonin didn't help his sleep.  He did well on vacation with stress level, but still didn't sleep well. No SI/HI.   He doesn't feel depressed.  Home situation is good.    Shoulder pain, esp laying down at night.  H/o fall 2015.  Pain reaching above or out to the side.    Meds, vitals, and allergies reviewed.   ROS: See HPI.  Otherwise, noncontributory.  nad ncat Mmm rrr ctab abd soft Ext w/o edema L shoulder with some pain on int>ext rotation, pain with reaching up with abduction.  No arm drop.  Neg impingement.

## 2013-09-08 DIAGNOSIS — Z566 Other physical and mental strain related to work: Secondary | ICD-10-CM | POA: Insufficient documentation

## 2013-09-08 NOTE — Assessment & Plan Note (Signed)
D/w pt.  Likely a chronic strain on him.  Some exercise, healthy diet.  Options are counseling vs meds.  Med options limited by work situation.  SSRI is reasonable.  No Si/Hi. Okay for outpatient fu.  He agrees.  Routine timeline of med effect d/w pt.  He'll update me. >25 minutes spent in face to face time with patient, >50% spent in counselling or coordination of care.

## 2013-09-08 NOTE — Assessment & Plan Note (Signed)
Refer to ortho.

## 2013-09-14 ENCOUNTER — Ambulatory Visit: Payer: BC Managed Care – PPO | Admitting: Family Medicine

## 2013-10-15 ENCOUNTER — Ambulatory Visit: Payer: Self-pay | Admitting: Orthopedic Surgery

## 2013-12-17 ENCOUNTER — Ambulatory Visit: Payer: Self-pay | Admitting: Orthopedic Surgery

## 2014-01-13 ENCOUNTER — Encounter: Payer: Self-pay | Admitting: Orthopedic Surgery

## 2014-01-29 ENCOUNTER — Encounter: Payer: Self-pay | Admitting: Orthopedic Surgery

## 2014-02-08 ENCOUNTER — Encounter: Payer: Self-pay | Admitting: Family Medicine

## 2014-03-01 ENCOUNTER — Encounter: Payer: Self-pay | Admitting: Orthopedic Surgery

## 2014-03-09 ENCOUNTER — Other Ambulatory Visit: Payer: Self-pay | Admitting: Family Medicine

## 2014-03-30 ENCOUNTER — Encounter
Admit: 2014-03-30 | Disposition: A | Discharge status: Home / Self Care | Payer: Self-pay | Attending: Orthopedic Surgery | Admitting: Orthopedic Surgery

## 2014-05-22 NOTE — Op Note (Signed)
PATIENT NAME:  Jolayne PantherROOF, Cliford G MR#:  [id number] DATE OF BIRTH:  15-Mar-1960  DATE OF PROCEDURE:  12/17/2013  PREOPERATIVE DIAGNOSES: Left rotator cuff tear, supraspinatus tear.   POSTOPERATIVE DIAGNOSES: Left rotator cuff tear, supraspinatus tear.   PROCEDURE: Left rotator cuff repair.   ANESTHESIA: General.   SURGEON: Kennedy BuckerMichael Rawlins Stuard, M.D.   DESCRIPTION OF PROCEDURE: The patient was brought to the operating room and after adequate anesthesia and block had been obtained, the shoulder was prepped and draped in the usual sterile fashion with a bump underneath shoulder blade with the patient in a semirecumbent position. After patient identification and timeout procedures were completed, an incision was made over the distal end of the acromion. A deltoid on approach was used to enter the subacromial space. The anterior acromion was thick and a motorized rasp was used to smooth off the anterior edge of this giving access anteriorly as well as releasing the CA ligament. There was extensive bursitis and after debridement of the bursa and the rotator cuff tear was identified approximately 2 cm wide and retracted a centimeter. Sutures were passed through this, 2 sets, anterior and posterior, and then the bone was rasped laterally for a good bleeding bed for the subsequent tendon repair. Two suture anchors were used with a 5.5. ArthroCare SpeedScrew inserted first posterior and tightening the posterior sutures. This brought the tendon down to the tuberosity. The second SpeedScrew was placed slightly more anteriorly and this appeared to be essentially anatomic reduction of the cuff. The shoulder was placed through range of motion and there did not appear to be any impingement on repair. The wound was thoroughly irrigated. The deltoid was repaired using 0 Vicryl, 2-0 Vicryl subcutaneously, and 4-0 nylon for the skin; 20 mL of 0.25% Sensorcaine with epinephrine was infiltrated to aid in postoperative analgesia.    ESTIMATED BLOOD LOSS: 25 mL.   COMPLICATIONS: None.   SPECIMEN: None.   IMPLANTS: SpeedScrew anchor x 2.   CONDITION: To recovery room, stable. The wound was dressed with Xeroform, 4 x 4's, ABD, and patient placed in a sling.    ____________________________ Leitha SchullerMichael J. Ishi Danser, MD mjm:bm D: 12/17/2013 21:12:51 ET T: 12/18/2013 07:53:43 ET JOB#: [id number]  cc: Leitha SchullerMichael J. Anberlin Diez, MD, <Dictator> Leitha SchullerMICHAEL J Yaslin Kirtley MD ELECTRONICALLY SIGNED 12/20/2013 9:15

## 2014-07-29 ENCOUNTER — Other Ambulatory Visit: Payer: Self-pay | Admitting: Orthopedic Surgery

## 2014-07-29 DIAGNOSIS — Z9889 Other specified postprocedural states: Secondary | ICD-10-CM

## 2014-08-05 ENCOUNTER — Ambulatory Visit
Admission: RE | Admit: 2014-08-05 | Discharge: 2014-08-05 | Disposition: A | Discharge status: Home / Self Care | Payer: BLUE CROSS/BLUE SHIELD | Source: Ambulatory Visit | Attending: Orthopedic Surgery | Admitting: Orthopedic Surgery

## 2014-08-05 DIAGNOSIS — G8929 Other chronic pain: Secondary | ICD-10-CM | POA: Insufficient documentation

## 2014-08-05 DIAGNOSIS — Z9889 Other specified postprocedural states: Secondary | ICD-10-CM | POA: Insufficient documentation

## 2014-08-05 DIAGNOSIS — M25512 Pain in left shoulder: Secondary | ICD-10-CM | POA: Insufficient documentation

## 2014-08-05 MED ORDER — GADOBENATE DIMEGLUMINE 529 MG/ML IV SOLN: 5.0000 mL | Freq: Once | INTRAVENOUS | Status: AC | PRN

## 2014-08-05 MED ORDER — IOHEXOL 300 MG/ML  SOLN: 100.0000 mL | Freq: Once | INTRAMUSCULAR | Status: AC | PRN

## 2014-09-17 ENCOUNTER — Inpatient Hospital Stay
Admission: RE | Admit: 2014-09-17 | Discharge: 2014-09-17 | Disposition: A | Discharge status: Home / Self Care | Payer: BC Managed Care – PPO | Source: Ambulatory Visit

## 2014-09-17 DIAGNOSIS — Z800 Family history of malignant neoplasm of digestive organs: Secondary | ICD-10-CM | POA: Diagnosis not present

## 2014-09-17 DIAGNOSIS — G4700 Insomnia, unspecified: Secondary | ICD-10-CM | POA: Diagnosis not present

## 2014-09-17 DIAGNOSIS — Z87891 Personal history of nicotine dependence: Secondary | ICD-10-CM | POA: Diagnosis not present

## 2014-09-17 DIAGNOSIS — F419 Anxiety disorder, unspecified: Secondary | ICD-10-CM | POA: Diagnosis not present

## 2014-09-17 DIAGNOSIS — Z87442 Personal history of urinary calculi: Secondary | ICD-10-CM | POA: Diagnosis not present

## 2014-09-17 DIAGNOSIS — Z8042 Family history of malignant neoplasm of prostate: Secondary | ICD-10-CM | POA: Diagnosis not present

## 2014-09-17 DIAGNOSIS — Z79899 Other long term (current) drug therapy: Secondary | ICD-10-CM | POA: Diagnosis not present

## 2014-09-17 DIAGNOSIS — Z809 Family history of malignant neoplasm, unspecified: Secondary | ICD-10-CM | POA: Diagnosis not present

## 2014-09-17 DIAGNOSIS — M75102 Unspecified rotator cuff tear or rupture of left shoulder, not specified as traumatic: Secondary | ICD-10-CM | POA: Diagnosis present

## 2014-09-17 DIAGNOSIS — M65812 Other synovitis and tenosynovitis, left shoulder: Secondary | ICD-10-CM | POA: Diagnosis not present

## 2014-09-17 HISTORY — DX: Anxiety disorder, unspecified: F41.9

## 2014-09-17 HISTORY — DX: Unspecified rotator cuff tear or rupture of unspecified shoulder, not specified as traumatic: M75.100

## 2014-09-17 NOTE — Patient Instructions (Signed)
  Your procedure is scheduled on: 09/23/14 Thurs  Report to Day Surgery. To find out your arrival time please call [phone] between 1PM - 3PM on 09/22/14 Wed.  Remember: Instructions that are not followed completely may result in serious medical risk, up to and including death, or upon the discretion of your surgeon and anesthesiologist your surgery may need to be rescheduled.    _x___ 1. Do not eat food or drink liquids after midnight. No gum chewing or hard candies.     ____ 2. No Alcohol for 24 hours before or after surgery.   ____ 3. Bring all medications with you on the day of surgery if instructed.    _x___ 4. Notify your doctor if there is any change in your medical condition     (cold, fever, infections).     Do not wear jewelry, make-up, hairpins, clips or nail polish.  Do not wear lotions, powders, or perfumes. You may wear deodorant.  Do not shave 48 hours prior to surgery. Men may shave face and neck.  Do not bring valuables to the hospital.    [facility] is not responsible for any belongings or valuables.               Contacts, dentures or bridgework may not be worn into surgery.  Leave your suitcase in the car. After surgery it may be brought to your room.  For patients admitted to the hospital, discharge time is determined by your                treatment team.   Patients discharged the day of surgery will not be allowed to drive home.   Please read over the following fact sheets that you were given:      _x___ Take these medicines the morning of surgery with A SIP OF WATER:    1. escitalopram (LEXAPRO) 10 MG tablet  2.   3.   4.  5.  6.  ____ Fleet Enema (as directed)   __x__ Use CHG Soap as directed Sage scrub AM of surgery  ____ Use inhalers on the day of surgery  ____ Stop metformin 2 days prior to surgery    ____ Take 1/2 of usual insulin dose the night before surgery and none on the morning of surgery.   ____ Stop Coumadin/Plavix/aspirin on    _x___ Stop Anti-inflammatories on  Stop Ibuprofen today   ____ Stop supplements until after surgery.    ____ Bring C-Pap to the hospital.

## 2014-09-23 ENCOUNTER — Ambulatory Visit
Admission: RE | Admit: 2014-09-23 | Discharge: 2014-09-23 | Disposition: A | Discharge status: Home / Self Care | Payer: BLUE CROSS/BLUE SHIELD | Source: Ambulatory Visit | Attending: Surgery | Admitting: Surgery

## 2014-09-23 ENCOUNTER — Ambulatory Visit: Payer: BLUE CROSS/BLUE SHIELD | Admitting: Anesthesiology

## 2014-09-23 ENCOUNTER — Encounter
Admission: RE | Disposition: A | Discharge status: Home / Self Care | Payer: Self-pay | Source: Ambulatory Visit | Attending: Surgery

## 2014-09-23 ENCOUNTER — Encounter: Payer: Self-pay | Admitting: *Deleted

## 2014-09-23 DIAGNOSIS — M65812 Other synovitis and tenosynovitis, left shoulder: Secondary | ICD-10-CM | POA: Diagnosis not present

## 2014-09-23 DIAGNOSIS — F419 Anxiety disorder, unspecified: Secondary | ICD-10-CM | POA: Insufficient documentation

## 2014-09-23 DIAGNOSIS — Z87891 Personal history of nicotine dependence: Secondary | ICD-10-CM | POA: Insufficient documentation

## 2014-09-23 DIAGNOSIS — Z79899 Other long term (current) drug therapy: Secondary | ICD-10-CM | POA: Insufficient documentation

## 2014-09-23 DIAGNOSIS — Z800 Family history of malignant neoplasm of digestive organs: Secondary | ICD-10-CM | POA: Insufficient documentation

## 2014-09-23 DIAGNOSIS — Z87442 Personal history of urinary calculi: Secondary | ICD-10-CM | POA: Insufficient documentation

## 2014-09-23 DIAGNOSIS — Z809 Family history of malignant neoplasm, unspecified: Secondary | ICD-10-CM | POA: Insufficient documentation

## 2014-09-23 DIAGNOSIS — G4700 Insomnia, unspecified: Secondary | ICD-10-CM | POA: Insufficient documentation

## 2014-09-23 DIAGNOSIS — Z8042 Family history of malignant neoplasm of prostate: Secondary | ICD-10-CM | POA: Insufficient documentation

## 2014-09-23 HISTORY — PX: SHOULDER ARTHROSCOPY WITH OPEN ROTATOR CUFF REPAIR: SHX6092

## 2014-09-23 SURGERY — ARTHROSCOPY, SHOULDER WITH REPAIR, ROTATOR CUFF, OPEN
Anesthesia: Regional | Site: Shoulder | Laterality: Left

## 2014-09-23 MED ORDER — IBUPROFEN 200 MG PO TABS: 800.0000 mg | ORAL_TABLET | Freq: Three times a day (TID) | ORAL | Status: DC

## 2014-09-23 MED ORDER — CLINDAMYCIN PHOSPHATE 900 MG/50ML IV SOLN: 900.0000 mg | Freq: Once | INTRAVENOUS | Status: DC

## 2014-09-23 MED ORDER — PROPOFOL 10 MG/ML IV BOLUS
INTRAVENOUS | Status: DC | PRN
  Administered 2014-09-23: 150 mg via INTRAVENOUS

## 2014-09-23 MED ORDER — PROMETHAZINE HCL 25 MG/ML IJ SOLN: 6.2500 mg | INTRAMUSCULAR | Status: DC | PRN

## 2014-09-23 MED ORDER — NEOSTIGMINE METHYLSULFATE 10 MG/10ML IV SOLN
INTRAVENOUS | Status: DC | PRN
  Administered 2014-09-23: 4 mg via INTRAVENOUS

## 2014-09-23 MED ORDER — PHENYLEPHRINE HCL 10 MG/ML IJ SOLN
INTRAMUSCULAR | Status: DC | PRN
  Administered 2014-09-23 (×3): 200 ug via INTRAVENOUS

## 2014-09-23 MED ORDER — ONDANSETRON HCL 4 MG/2ML IJ SOLN
INTRAMUSCULAR | Status: DC | PRN
  Administered 2014-09-23: 4 mg via INTRAVENOUS

## 2014-09-23 MED ORDER — ROPIVACAINE HCL 5 MG/ML IJ SOLN
INTRAMUSCULAR | Status: AC
  Filled 2014-09-23: qty 40

## 2014-09-23 MED ORDER — EPINEPHRINE HCL 1 MG/ML IJ SOLN
INTRAMUSCULAR | Status: DC | PRN
Start: 2014-09-23 — End: 2014-09-23
  Administered 2014-09-23: 2 mL

## 2014-09-23 MED ORDER — FENTANYL CITRATE (PF) 100 MCG/2ML IJ SOLN: 25.0000 ug | INTRAMUSCULAR | Status: DC | PRN

## 2014-09-23 MED ORDER — MIDAZOLAM HCL 2 MG/2ML IJ SOLN
INTRAMUSCULAR | Status: DC | PRN
  Administered 2014-09-23: 2 mg via INTRAVENOUS

## 2014-09-23 MED ORDER — ROCURONIUM BROMIDE 100 MG/10ML IV SOLN
INTRAVENOUS | Status: DC | PRN
  Administered 2014-09-23: 40 mg via INTRAVENOUS

## 2014-09-23 MED ORDER — MIDAZOLAM HCL 2 MG/2ML IJ SOLN
2.0000 mg | Freq: Once | INTRAMUSCULAR | Status: AC
  Administered 2014-09-23: 2 mg via INTRAVENOUS

## 2014-09-23 MED ORDER — BUPIVACAINE-EPINEPHRINE 0.5% -1:200000 IJ SOLN
INTRAMUSCULAR | Status: DC | PRN
  Administered 2014-09-23: 20 mL

## 2014-09-23 MED ORDER — FENTANYL CITRATE (PF) 100 MCG/2ML IJ SOLN
INTRAMUSCULAR | Status: DC | PRN
  Administered 2014-09-23: 100 ug via INTRAVENOUS

## 2014-09-23 MED ORDER — CLINDAMYCIN PHOSPHATE 900 MG/50ML IV SOLN
INTRAVENOUS | Status: AC
  Administered 2014-09-23: 900 mg via INTRAVENOUS
  Filled 2014-09-23: qty 50

## 2014-09-23 MED ORDER — EPHEDRINE SULFATE 50 MG/ML IJ SOLN
INTRAMUSCULAR | Status: DC | PRN
  Administered 2014-09-23 (×2): 20 mg via INTRAVENOUS

## 2014-09-23 MED ORDER — LIDOCAINE HCL (PF) 2 % IJ SOLN
INTRAMUSCULAR | Status: AC
  Filled 2014-09-23: qty 10

## 2014-09-23 MED ORDER — LACTATED RINGERS IV SOLN
INTRAVENOUS | Status: DC
  Administered 2014-09-23 (×3): via INTRAVENOUS

## 2014-09-23 MED ORDER — OXYCODONE HCL 5 MG PO TABS: 5.0000 mg | ORAL_TABLET | ORAL | Status: DC | PRN

## 2014-09-23 MED ORDER — MIDAZOLAM HCL 5 MG/5ML IJ SOLN
INTRAMUSCULAR | Status: AC
  Administered 2014-09-23: 2 mg via INTRAVENOUS
  Filled 2014-09-23: qty 5

## 2014-09-23 MED ORDER — EPINEPHRINE HCL 1 MG/ML IJ SOLN
INTRAMUSCULAR | Status: AC
  Filled 2014-09-23: qty 2

## 2014-09-23 MED ORDER — DEXAMETHASONE SODIUM PHOSPHATE 4 MG/ML IJ SOLN
INTRAMUSCULAR | Status: DC | PRN
  Administered 2014-09-23: 4 mg via INTRAVENOUS

## 2014-09-23 MED ORDER — BUPIVACAINE-EPINEPHRINE (PF) 0.5% -1:200000 IJ SOLN
INTRAMUSCULAR | Status: AC
  Filled 2014-09-23: qty 30

## 2014-09-23 MED ORDER — GLYCOPYRROLATE 0.2 MG/ML IJ SOLN
INTRAMUSCULAR | Status: DC | PRN
  Administered 2014-09-23: 0.6 mg via INTRAVENOUS

## 2014-09-23 SURGICAL SUPPLY — 48 items
ANCHOR JUGGERKNOT WTAP NDL 2.9 (Anchor) ×3 IMPLANT
BIT DRILL JUGRKNT W/NDL BIT2.9 (DRILL) ×1 IMPLANT
BLADE FULL RADIUS 3.5 (BLADE) IMPLANT
BLADE SHAVER 4.5X7 STR FR (MISCELLANEOUS) ×3 IMPLANT
BUR ACROMIONIZER 4.0 (BURR) ×3 IMPLANT
BUR BR 5.5 WIDE MOUTH (BURR) IMPLANT
CANNULA 8.5X75 THRED (CANNULA) IMPLANT
CANNULA SHAVER 8MMX76MM (CANNULA) ×3 IMPLANT
CHLORAPREP W/TINT 26ML (MISCELLANEOUS) ×6 IMPLANT
COVER MAYO STAND STRL (DRAPES) ×3 IMPLANT
DECANTER SPIKE VIAL GLASS SM (MISCELLANEOUS) ×3 IMPLANT
DRAPE IMP U-DRAPE 54X76 (DRAPES) ×3 IMPLANT
DRAPE SURG 17X11 SM STRL (DRAPES) ×3 IMPLANT
DRILL JUGGERKNOT W/NDL BIT 2.9 (DRILL) ×3
DRSG OPSITE POSTOP 4X8 (GAUZE/BANDAGES/DRESSINGS) IMPLANT
GAUZE PETRO XEROFOAM 1X8 (MISCELLANEOUS) ×3 IMPLANT
GAUZE SPONGE 4X4 12PLY STRL (GAUZE/BANDAGES/DRESSINGS) ×3 IMPLANT
GLOVE BIO SURGEON STRL SZ7.5 (GLOVE) ×6 IMPLANT
GLOVE BIO SURGEON STRL SZ8 (GLOVE) ×6 IMPLANT
GLOVE BIOGEL PI IND STRL 8 (GLOVE) ×1 IMPLANT
GLOVE BIOGEL PI INDICATOR 8 (GLOVE) ×2
GLOVE INDICATOR 8.0 STRL GRN (GLOVE) ×3 IMPLANT
GOWN STRL REUS W/ TWL LRG LVL3 (GOWN DISPOSABLE) ×2 IMPLANT
GOWN STRL REUS W/ TWL XL LVL3 (GOWN DISPOSABLE) ×1 IMPLANT
GOWN STRL REUS W/TWL LRG LVL3 (GOWN DISPOSABLE) ×4
GOWN STRL REUS W/TWL XL LVL3 (GOWN DISPOSABLE) ×2
GRASPER SUT 15 45D LOW PRO (SUTURE) IMPLANT
IV LACTATED RINGER IRRG 3000ML (IV SOLUTION) ×4
IV LR IRRIG 3000ML ARTHROMATIC (IV SOLUTION) ×2 IMPLANT
MANIFOLD NEPTUNE II (INSTRUMENTS) ×3 IMPLANT
MASK FACE SPIDER DISP (MASK) ×3 IMPLANT
MAT BLUE FLOOR 46X72 FLO (MISCELLANEOUS) ×3 IMPLANT
NEEDLE REVERSE CUT 1/2 CRC (NEEDLE) IMPLANT
PACK ARTHROSCOPY SHOULDER (MISCELLANEOUS) ×3 IMPLANT
PAD GROUND ADULT SPLIT (MISCELLANEOUS) ×3 IMPLANT
SLING ARM LRG DEEP (SOFTGOODS) IMPLANT
SLING ULTRA II LG (MISCELLANEOUS) ×3 IMPLANT
STAPLER SKIN PROX 35W (STAPLE) ×3 IMPLANT
STRAP SAFETY BODY (MISCELLANEOUS) ×3 IMPLANT
SUT ETHIBOND 0 MO6 C/R (SUTURE) ×3 IMPLANT
SUT PROLENE 4 0 PS 2 18 (SUTURE) ×3 IMPLANT
SUT VIC AB 2-0 CT1 27 (SUTURE) ×4
SUT VIC AB 2-0 CT1 TAPERPNT 27 (SUTURE) ×2 IMPLANT
TAPE MICROFOAM 4IN (TAPE) ×3 IMPLANT
TUBING ARTHRO INFLOW-ONLY STRL (TUBING) ×3 IMPLANT
TUBING CONNECTING 10 (TUBING) ×2 IMPLANT
TUBING CONNECTING 10' (TUBING) ×1
WAND HAND CNTRL MULTIVAC 90 (MISCELLANEOUS) ×3 IMPLANT

## 2014-09-23 NOTE — Anesthesia Postprocedure Evaluation (Signed)
  Anesthesia Post-op Note  Patient: Mark Hernandez.  Procedure(s) Performed: Procedure(s): SHOULDER ARTHROSCOPY , debridement, decompression, WITH  mini OPEN ROTATOR CUFF REPAIR (Left)  Anesthesia type:General, Regional  Patient location: PACU  Post pain: Pain level controlled  Post assessment: Post-op Vital signs reviewed, Patient's Cardiovascular Status Stable, Respiratory Function Stable, Patent Airway and No signs of Nausea or vomiting  Post vital signs: Reviewed and stable  Last Vitals:  Filed Vitals:   09/23/14 1227  BP: 127/76  Pulse: 87  Temp:   Resp: 18    Level of consciousness: awake, alert  and patient cooperative  Complications: No apparent anesthesia complications

## 2014-09-23 NOTE — Transfer of Care (Signed)
Immediate Anesthesia Transfer of Care Note  Patient: Mark Hernandez.  Procedure(s) Performed: Procedure(s): SHOULDER ARTHROSCOPY , debridement, decompression, WITH  mini OPEN ROTATOR CUFF REPAIR (Left)  Patient Location: PACU  Anesthesia Type:General and Regional  Level of Consciousness: awake, alert  and oriented  Airway & Oxygen Therapy: Patient Spontanous Breathing and Patient connected to face mask oxygen  Post-op Assessment: Report given to RN and Post -op Vital signs reviewed and stable  Post vital signs: Reviewed and stable  Last Vitals:  Filed Vitals:   09/23/14 1100  BP: 127/82  Pulse:   Temp: 36.1 C  Resp: 16    Complications: No apparent anesthesia complications

## 2014-09-23 NOTE — Op Note (Signed)
09/23/2014  11:12 AM  Patient:   Mark Hernandez.  Pre-Op Diagnosis:   Recurrent rotator cuff tear, left shoulder.  Postoperative diagnosis: Same with mild labral fraying and synovitis, left shoulder.  Procedure: Limited arthroscopic debridement, arthroscopic subacromial decompression, and mini-open rotator cuff repair, left shoulder.  Anesthesia: General endotracheal with interscalene block placed preoperatively by the anesthesiologist.  Surgeon:   Maryagnes Amos, MD  Assistant:   None  Findings: As above. There was a small full-thickness recurrent tear involving the mid insertional fibers of the super spinatus tendon at the site of the prior repair. 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 The labrum demonstrated fraying superiorly and anteriorly but otherwise was intact to the biceps tendon was in excellent condition, as was the remainder of the rotator cuff.  Complications: None  Fluids:   1000 cc  Estimated blood loss: 10 cc  Tourniquet time: None  Drains: None  Closure: Staples   Brief clinical note: The patient is a 54 year old male who is now 9 months status post a repair of a small full-thickness rotator cuff tear. Despite extensive physical therapy, his symptoms have persisted. An MRI scan confirmed the presence of a recurrent rotator cuff tear. He presents this time for arthroscopy, debridement, and repair of the recurrent rotator cuff tear.   Procedure: The patient underwent placement of an interscalene block by the anesthesiologist in the preoperative holding area before he was brought into the operating room and lain in the supine position. After adequate general endotracheal intubation and anesthesia was obtained, the patient was repositioned in the beach chair position using the beach chair positioner. The left shoulder and upper extremity were prepped with ChloraPrep solution before being  draped sterilely. Preoperative antibiotics were administered. A timeout was performed to confirm the proper side before the expected portal sites and incision site were injected with 0.5% Sensorcaine with epinephrine. A posterior portal was created and the glenohumeral joint thoroughly inspected with the findings as described above. An anterior portal was created using an outside-in technique. The labrum and rotator cuff were further probed, again confirming the above-noted findings. The areas of fraying involving the anterior and superior portions the labrum were lightly debrided back to stable margins using the full-radius resector, as were the areas of synovitis anteriorly and superiorly. The areas of grade 2-3 chondromalacia also were lightly abraded back to stable margins using the full-radius resector. The ArthroCare wand was inserted and used to obtain hemostasis as well as to "anneal" the labrum superiorly and anteriorly. The instruments were removed from the joint after suctioning the excess fluid.  The camera was repositioned through the posterior portal into the subacromial space. A separate lateral portal was created using an outside-in technique. The 3.5 mm full-radius resector was introduced and used to perform a subtotal bursectomy. The ArthroCare wand was then inserted and used to remove the periosteal tissue off the undersurface of the anterior third of the acromion as well as to recess the coracoacromial ligament from its attachment along the anterior and lateral margins of the acromion. The 4.0 mm acromionizing bur was introduced and used to complete the decompression by removing the undersurface of the anterior third of the acromion. The full radius resector was reintroduced to remove any residual bony debris before the ArthroCare wand was reintroduced to obtain hemostasis. The instruments were then removed from the subacromial space after suctioning the excess fluid.  An approximately 4-5 cm  incision was made  over the anterolateral aspect of the shoulder beginning at the anterolateral corner of the acromion and extending distally in line with the bicipital groove. This incision was carried down through the subcutaneous tissues to expose the deltoid fascia. The raphae between the anterior and middle thirds was identified and this plane developed to provide access into the subacromial space. Additional bursal tissues were debrided sharply using Metzenbaum scissors. The rotator cuff tear was readily identified. The margins were debrided sharply with a #15 blade and the exposed greater tuberosity roughened with a rongeur. The tear was repaired using a single Biomet 2.9 mm JuggerKnot anchor. Several additional #0 Ethibond interrupted sutures were placed in a side-to-side fashion to complete the closure of the rotator cuff tear. An apparent watertight closure was obtained.  The wound was copiously irrigated with sterile saline solution before the deltoid raphae was reapproximated using 2-0 Vicryl interrupted sutures. The subcutaneous tissues were closed in two layers using 2-0 Vicryl interrupted sutures before the skin was closed using staples. The portal sites also were closed using staples. A sterile bulky dressing was applied to the shoulder before the arm was placed into a shoulder immobilizer. The patient was then awakened, extubated, and returned to the recovery room in satisfactory condition after tolerating the procedure well.

## 2014-09-23 NOTE — Discharge Instructions (Addendum)
Keep dressing dry and intact.  °May shower after dressing changed on post-op day #4 (Monday).  °Cover staples/sutures with Band-Aids after drying off. °Apply ice frequently to shoulder. °Keep shoulder immobilizer on at all times except may remove for bathing purposes. °Follow-up in 10-14 days or as scheduled.AMBULATORY SURGERY  °DISCHARGE INSTRUCTIONS ° ° °1) The drugs that you were given will stay in your system until tomorrow so for the next 24 hours you should not: ° °A) Drive an automobile °B) Make any legal decisions °C) Drink any alcoholic beverage ° ° °2) You may resume regular meals tomorrow.  Today it is better to start with liquids and gradually work up to solid foods. ° °You may eat anything you prefer, but it is better to start with liquids, then soup and crackers, and gradually work up to solid foods. ° ° °3) Please notify your doctor immediately if you have any unusual bleeding, trouble breathing, redness and pain at the surgery site, drainage, fever, or pain not relieved by medication. ° ° ° °4) Additional Instructions: ° ° ° ° ° ° ° °Please contact your physician with any problems or Same Day Surgery at [phone], Monday through Friday 6 am to 4 pm, or [place] at  Main number at [phone]. °

## 2014-09-23 NOTE — H&P (Signed)
Paper H&P to be scanned into permanent record. H&P reviewed. No changes. 

## 2014-09-23 NOTE — Anesthesia Preprocedure Evaluation (Signed)
Anesthesia Evaluation  Patient identified by MRN, date of birth, ID band Patient awake    Reviewed: Allergy & Precautions, H&P , NPO status , Patient's Chart, lab work & pertinent test results, reviewed documented beta blocker date and time   History of Anesthesia Complications Negative for: history of anesthetic complications  Airway Mallampati: III  TM Distance: >3 FB Neck ROM: full    Dental no notable dental hx. (+) Teeth Intact   Pulmonary neg pulmonary ROS,  breath sounds clear to auscultation  Pulmonary exam normal       Cardiovascular Exercise Tolerance: Good negative cardio ROS Normal cardiovascular examRhythm:regular Rate:Normal     Neuro/Psych Seizures - (one in 2015),  PSYCHIATRIC DISORDERS (anxiety and insomnia)    GI/Hepatic negative GI ROS, Neg liver ROS,   Endo/Other  negative endocrine ROS  Renal/GU Renal disease (kidney stones)  negative genitourinary   Musculoskeletal   Abdominal   Peds  Hematology negative hematology ROS (+)   Anesthesia Other Findings Past Medical History:   Kidney stone                                                 Rotator cuff tear                                            Anxiety                                                      Reproductive/Obstetrics negative OB ROS                             Anesthesia Physical Anesthesia Plan  ASA: I  Anesthesia Plan: General and Regional   Post-op Pain Management: GA combined w/ Regional for post-op pain   Induction:   Airway Management Planned:   Additional Equipment:   Intra-op Plan:   Post-operative Plan:   Informed Consent: I have reviewed the patients History and Physical, chart, labs and discussed the procedure including the risks, benefits and alternatives for the proposed anesthesia with the patient or authorized representative who has indicated his/her understanding and  acceptance.   Dental Advisory Given  Plan Discussed with: Anesthesiologist, CRNA and Surgeon  Anesthesia Plan Comments:         Anesthesia Quick Evaluation

## 2014-09-23 NOTE — Anesthesia Procedure Notes (Addendum)
Anesthesia Regional Block:  Interscalene brachial plexus block  Pre-Anesthetic Checklist: ,, timeout performed, Correct Patient, Correct Site, Correct Laterality, Correct Procedure, Correct Position, site marked, Risks and benefits discussed,  Surgical consent,  Pre-op evaluation,  At surgeon's request and post-op pain management  Laterality: Left and Upper  Prep: chloraprep       Needles:  Injection technique: Single-shot  Needle Type: Echogenic Stimulator Needle     Needle Length: 10cm 10 cm Needle Gauge: 22 and 22 G    Additional Needles:  Procedures: ultrasound guided (picture in chart) Interscalene brachial plexus block Narrative:  Start time: 09/23/2014 8:54 AM End time: 09/23/2014 9:00 AM Injection made incrementally with aspirations every 5 mL.  Performed by: Personally  Anesthesiologist: Lenard Simmer   Procedure Name: Intubation Performed by: Lanier Ensign Pre-anesthesia Checklist: Patient identified, Patient being monitored, Timeout performed, Emergency Drugs available and Suction available Patient Re-evaluated:Patient Re-evaluated prior to inductionOxygen Delivery Method: Circle system utilized Preoxygenation: Pre-oxygenation with 100% oxygen Intubation Type: IV induction Ventilation: Mask ventilation without difficulty Laryngoscope Size: Mac and 4 Grade View: Grade III Tube type: Oral Tube size: 7.0 mm Number of attempts: 1 Airway Equipment and Method: Stylet Placement Confirmation: ETT inserted through vocal cords under direct vision,  positive ETCO2 and breath sounds checked- equal and bilateral Secured at: 21 cm Tube secured with: Tape Dental Injury: Teeth and Oropharynx as per pre-operative assessment

## 2014-09-24 ENCOUNTER — Encounter: Payer: Self-pay | Admitting: Surgery

## 2014-10-06 ENCOUNTER — Ambulatory Visit: Payer: BLUE CROSS/BLUE SHIELD | Attending: Student

## 2014-10-06 DIAGNOSIS — M7582 Other shoulder lesions, left shoulder: Secondary | ICD-10-CM | POA: Insufficient documentation

## 2014-10-06 DIAGNOSIS — Z9889 Other specified postprocedural states: Secondary | ICD-10-CM

## 2014-10-06 DIAGNOSIS — M25612 Stiffness of left shoulder, not elsewhere classified: Secondary | ICD-10-CM

## 2014-10-06 DIAGNOSIS — R531 Weakness: Secondary | ICD-10-CM

## 2014-10-06 NOTE — Patient Instructions (Addendum)
Pt education on protective phase of rehabilitation from his L shoulder rotator cuff repair. Reviewed proper way to wash arm pit when taking a shower while protecting surgery as well as propping L arm in neutral when sleeping in bed. Pt verbalized understanding.   Mod verbal, and tactile cues to not shrug L shoulder up when performing gentle scapular retraction during chin tucks.

## 2014-10-06 NOTE — Therapy (Signed)
[place] [facility] [address], [place], [address] Phone: [phone]   Fax:  [phone]  Physical Therapy Evaluation  Patient Details  Name: Mark Hernandez. MRN: [id number] Date of Birth: [birth year] Referring Provider:  Anson [place], [place]*  Encounter Date: 10/06/2014      PT End of Session - 10/06/14 1309    Visit Number 1   Number of Visits 17   Date for PT Re-Evaluation 12/03/14   PT Start Time 1309   PT Stop Time 1352   PT Time Calculation (min) 43 min   Activity Tolerance Patient tolerated treatment well   Behavior During Therapy [facility] for tasks assessed/performed      Past Medical History  Diagnosis Date  . Kidney stone   . Rotator cuff tear   . Anxiety     Past Surgical History  Procedure Laterality Date  . Lithotripsy    . Shoulder surgery Bilateral     rotator cuff repair  . Shoulder arthroscopy with open rotator cuff repair Left 09/23/2014    Procedure: SHOULDER ARTHROSCOPY , debridement, decompression, WITH  mini OPEN ROTATOR CUFF REPAIR;  Surgeon: Christena Flake, MD;  Location: [facility] ORS;  Service: Orthopedics;  Laterality: Left;    There were no vitals filed for this visit.  Visit Diagnosis:  S/P rotator cuff repair - Plan: PT plan of care cert/re-cert  Weakness - Plan: PT plan of care cert/re-cert  Decreased ROM of left shoulder - Plan: PT plan of care cert/re-cert      Subjective Assessment - 10/06/14 1312    Subjective No L shoulder pain currently. Pt also states that he has not really had shoulder pain for the past week.    Pertinent History S/P L rotator cuff repair 09/23/2014. Pt currently 1.5 weeks post op. Originally had L rotator cuff surgery 11/2013, had physical therapy but feels like his L shoulder never healed. Pt does not know of any MD protocols for his L shoulder. Next MD appointment is 10/08/2014.     Patient Stated Goals "Just to get better. Get my ROM back and my  strength."   Currently in Pain? No/denies   Multiple Pain Sites No            [facility] PT Assessment - 10/06/14 1316    Assessment   Medical Diagnosis Complete tear of L rotator cuff   Onset Date/Surgical Date 09/23/14   Hand Dominance Right   Next MD Visit 10/08/14   Prior Therapy Pt participated in PT for original rotator cuff repair but still had pain.    Precautions   Precaution Comments Pt currently in protective phase of rehabilitation   Restrictions   Other Position/Activity Restrictions Pt currently in protective phase of rehabilitation   Balance Screen   Has the patient fallen in the past 6 months --  No information provided   Has the patient had a decrease in activity level because of a fear of falling?  --  No information provided   Is the patient reluctant to leave their home because of a fear of falling?  --  No information provided   Prior Function   Vocation Full time employment  currently works for the fire department   Vocation Requirements PLOF: able to raise his L arm up but limited and weak.    Observation/Other Assessments   Observations 3 anterior surgical incisions (middle incision about 2 inches long), one small posterior incision. Healing well.  Staples present.    Posture/Postural Control   Posture Comments bilaterally protracted shoulders, L scapula slightly higher. L arm in sling.    PROM   Overall PROM Comments R UE WFL all planes   Left Shoulder Flexion 35 Degrees  approximately. Stiff end feel   Left Shoulder ABduction 40 Degrees  scaption; approximately; stiff end feel   Left Shoulder External Rotation -9 Degrees  L shoulder in neutral; stiff end feel    Palpation   Palpation comment Muslce tightness L upper trap           [facility] Adult PT Treatment/Exercise - 10/06/14 1316    Exercises   Other Exercises  Directed patient with seated chin tucks with gentle scapular retraction 10x5 second holds (L arm in neutral and in sling) to promote upper  thoracic extension.    Manual Therapy   Manual therapy comments Soft tissue mobilization to L upper trap  Decreased L UT muscle tension          PT Education - 10/06/14 1358    Education provided Yes   Education Details plan of care, protective phase, ther-ex   Person(s) Educated Patient   Methods Explanation;Demonstration;Tactile cues;Verbal cues   Comprehension Verbalized understanding;Returned demonstration             PT Long Term Goals - 10/06/14 1414    PT LONG TERM GOAL #1   Title Patient will be independent with his HEP to promote ROM and ability to raise his arm when appropriate.    Time 8   Period Weeks   Status New   PT LONG TERM GOAL #2   Title Patient will improve L shoulder flexion, scaption, PROM to at least 145 degrees to promote ability to raise his L arm when appropriate.    Time 8   Period Weeks   Status New   PT LONG TERM GOAL #3   Title Patient will improve L shoulder ER PROM to at least 70 degrees to promote ability to raise his arm when appropriate.    Time 8   Period Weeks   Status New   PT LONG TERM GOAL #4   Title Patient will have L shoulder flexion and scaption AAROM to at least 145 degrees to promote ability to raise his arm when appropriate   Time 8   Period Weeks   Status New   PT LONG TERM GOAL #5   Title Patient will have L shoulder flexion and scaption AROM to at least 90 degrees to promote ability to raise his arm.    Time 8   Period Weeks   Status New            Plan - 10/06/14 1353    Clinical Impression Statement Patient is a 54 year old male who came to physical therapy S/P L rotator cuff repair on 09/23/2014. Pt currently presents with limited L shoulder flexion, scaption, ER PROM with stiff end feel, bilaterally protracted shoulders, L upper trap muscle tension, and difficulty performing functional tasks involving raising his L arm. Patient will benefit from skilled physical therapy services to address the aforementioned  deficits.    Pt will benefit from skilled therapeutic intervention in order to improve on the following deficits Hypomobility;Other (comment);Impaired flexibility;Decreased strength;Decreased mobility   Rehab Potential Good   Clinical Impairments Affecting Rehab Potential recurrent rotator cuff tear   PT Frequency 2x / week   PT Duration 8 weeks   PT Treatment/Interventions Manual techniques;Therapeutic exercise;Therapeutic activities;Patient/family education;Passive  range of motion;Neuromuscular re-education   PT Next Visit Plan Standard rotator cuff post op protocol per referral   Consulted and Agree with Plan of Care Patient         Problem List Patient Active Problem List   Diagnosis Date Noted  . Work-related stress 09/08/2013  . Injury of left shoulder 05/05/2013  . Syncope and collapse 05/04/2013    Thank you for your referral.   Loralyn Freshwater PT, DPT   10/06/2014, 8:52 PM  [place] [facility] PHYSICAL AND SPORTS MEDICINE [address] [place], [address] Phone: [phone]   Fax:  [phone]

## 2014-10-14 ENCOUNTER — Ambulatory Visit: Payer: BLUE CROSS/BLUE SHIELD | Admitting: Physical Therapy

## 2014-10-14 DIAGNOSIS — Z9889 Other specified postprocedural states: Secondary | ICD-10-CM

## 2014-10-14 DIAGNOSIS — M25612 Stiffness of left shoulder, not elsewhere classified: Secondary | ICD-10-CM

## 2014-10-14 NOTE — Therapy (Signed)
[facility] [address], [place], [address] Phone: [phone]   Fax:  [phone]  Physical Therapy Treatment  Patient Details  Name: Mark Hernandez. MRN: [id number] Date of Birth: 1961-01-19 Referring Provider:  Mikki Harbor, MD  Encounter Date: 10/14/2014      PT End of Session - 10/14/14 0926    Visit Number 2   Number of Visits 17   Date for PT Re-Evaluation 12/03/14   PT Start Time 0900   PT Stop Time 0926   PT Time Calculation (min) 26 min   Activity Tolerance Patient tolerated treatment well   Behavior During Therapy [facility] for tasks assessed/performed      Past Medical History  Diagnosis Date  . Kidney stone   . Rotator cuff tear   . Anxiety     Past Surgical History  Procedure Laterality Date  . Lithotripsy    . Shoulder surgery Bilateral     rotator cuff repair  . Shoulder arthroscopy with open rotator cuff repair Left 09/23/2014    Procedure: SHOULDER ARTHROSCOPY , debridement, decompression, WITH  mini OPEN ROTATOR CUFF REPAIR;  Surgeon: Christena Flake, MD;  Location: [facility] ORS;  Service: Orthopedics;  Laterality: Left;    There were no vitals filed for this visit.  Visit Diagnosis:  S/P rotator cuff repair  Decreased ROM of left shoulder      Subjective Assessment - 10/14/14 0900    Subjective no shoulder pain. Pt reports no issues or neck pain currently.   Currently in Pain? No/denies   Pain Score 0-No pain        Objective:  Floppy fish mobs 3x1 min. Into abduction, into scaption, into flexion.  PROM in same positions. Deferred ER/IR until PT can speak with MD regarding protocol.  Wound is well visualized other than steri-strips and is C/D/I.  PT answered pt questions regarding progression of protocol and how to perform safe pendulum exercises, initially pt had pain with this so deferred and will try again at next  session.                              PT Long Term Goals - 10/06/14 1414    PT LONG TERM GOAL #1   Title Patient will be independent with his HEP to promote ROM and ability to raise his arm when appropriate.    Time 8   Period Weeks   Status New   PT LONG TERM GOAL #2   Title Patient will improve L shoulder flexion, scaption, PROM to at least 145 degrees to promote ability to raise his L arm when appropriate.    Time 8   Period Weeks   Status New   PT LONG TERM GOAL #3   Title Patient will improve L shoulder ER PROM to at least 70 degrees to promote ability to raise his arm when appropriate.    Time 8   Period Weeks   Status New   PT LONG TERM GOAL #4   Title Patient will have L shoulder flexion and scaption AAROM to at least 145 degrees to promote ability to raise his arm when appropriate   Time 8   Period Weeks   Status New   PT LONG TERM GOAL #5   Title Patient will have L shoulder flexion and scaption AROM to at least 90 degrees to promote ability to raise  his arm.    Time 8   Period Weeks   Status New               Plan - 10/14/14 0926    Clinical Impression Statement Pt is continuing to have no pain. Has progressed passive ROM as is approrpiate. Pt saw MD who removed staples and put in steri-strips and is pleased with pt's progress.   Pt will benefit from skilled therapeutic intervention in order to improve on the following deficits Hypomobility;Other (comment);Impaired flexibility;Decreased strength;Decreased mobility   Rehab Potential Good   Clinical Impairments Affecting Rehab Potential recurrent rotator cuff tear   PT Frequency 2x / week   PT Duration 8 weeks   PT Treatment/Interventions Manual techniques;Therapeutic exercise;Therapeutic activities;Patient/family education;Passive range of motion;Neuromuscular re-education   PT Next Visit Plan Standard rotator cuff post op protocol per referral        Problem List Patient Active  Problem List   Diagnosis Date Noted  . Work-related stress 09/08/2013  . Injury of left shoulder 05/05/2013  . Syncope and collapse 05/04/2013    Kristen Fromm 10/14/2014, 9:31 AM  [place] [facility] SPORTS MEDICINE [address], [place], [address] Phone: [phone]   Fax:  [phone]

## 2014-10-15 ENCOUNTER — Encounter: Payer: BC Managed Care – PPO | Admitting: Physical Therapy

## 2014-10-15 ENCOUNTER — Ambulatory Visit: Payer: BLUE CROSS/BLUE SHIELD | Admitting: Physical Therapy

## 2014-10-18 ENCOUNTER — Ambulatory Visit: Payer: BLUE CROSS/BLUE SHIELD | Admitting: Physical Therapy

## 2014-10-18 DIAGNOSIS — Z9889 Other specified postprocedural states: Secondary | ICD-10-CM

## 2014-10-18 DIAGNOSIS — M25612 Stiffness of left shoulder, not elsewhere classified: Secondary | ICD-10-CM

## 2014-10-18 DIAGNOSIS — R531 Weakness: Secondary | ICD-10-CM

## 2014-10-18 NOTE — Therapy (Signed)
[facility] [address] [place], [address] Phone: [phone]   Fax:  [phone]  Physical Therapy Treatment  Patient Details  Name: Mark Hernandez. MRN: [id number] Date of Birth: [birth year] Referring Provider:  Anson [place], [place]*  Encounter Date: 10/18/2014      PT End of Session - 10/18/14 0947    Visit Number 3   Number of Visits 17   Date for PT Re-Evaluation 12/03/14   PT Start Time 0910   PT Stop Time 0945   PT Time Calculation (min) 35 min   Activity Tolerance Patient tolerated treatment well;No increased pain   Behavior During Therapy [facility] for tasks assessed/performed      Past Medical History  Diagnosis Date  . Kidney stone   . Rotator cuff tear   . Anxiety     Past Surgical History  Procedure Laterality Date  . Lithotripsy    . Shoulder surgery Bilateral     rotator cuff repair  . Shoulder arthroscopy with open rotator cuff repair Left 09/23/2014    Procedure: SHOULDER ARTHROSCOPY , debridement, decompression, WITH  mini OPEN ROTATOR CUFF REPAIR;  Surgeon: Christena Flake, MD;  Location: [facility] ORS;  Service: Orthopedics;  Laterality: Left;    There were no vitals filed for this visit.  Visit Diagnosis:  S/P rotator cuff repair  Decreased ROM of left shoulder  Weakness      Subjective Assessment - 10/18/14 0946    Subjective Patient reports no pain. no issues with anything at this time.    Pertinent History S/P L rotator cuff repair 09/23/2014. Pt currently 1.5 weeks post op. Originally had L rotator cuff surgery 11/2013, had physical therapy but feels like his L shoulder never healed. Pt does not know of any MD protocols for his L shoulder. Next MD appointment is 10/08/2014.     Patient Stated Goals "Just to get better. Get my ROM back and my strength."   Currently in Pain? No/denies         Treatment: Gentle PROM of left shoulder in flexion, abduction, ER Active left  elbow wrist and hand range of motion in all planes.  Pendulums with demonstration and cues.           PT Education - 10/18/14 0947    Education provided Yes   Education Details proper way to perform pendulums.    Person(s) Educated Patient   Methods Explanation;Demonstration   Comprehension Verbalized understanding;Returned demonstration             PT Long Term Goals - 10/06/14 1414    PT LONG TERM GOAL #1   Title Patient will be independent with his HEP to promote ROM and ability to raise his arm when appropriate.    Time 8   Period Weeks   Status New   PT LONG TERM GOAL #2   Title Patient will improve L shoulder flexion, scaption, PROM to at least 145 degrees to promote ability to raise his L arm when appropriate.    Time 8   Period Weeks   Status New   PT LONG TERM GOAL #3   Title Patient will improve L shoulder ER PROM to at least 70 degrees to promote ability to raise his arm when appropriate.    Time 8   Period Weeks   Status New   PT LONG TERM GOAL #4   Title Patient will have L shoulder flexion and scaption  AAROM to at least 145 degrees to promote ability to raise his arm when appropriate   Time 8   Period Weeks   Status New   PT LONG TERM GOAL #5   Title Patient will have L shoulder flexion and scaption AROM to at least 90 degrees to promote ability to raise his arm.    Time 8   Period Weeks   Status New               Plan - 10/18/14 0948    Clinical Impression Statement Patient reports no pain, continues to wear brace states he follows up with MD in 2 weeks.    Rehab Potential Good   PT Frequency 2x / week   PT Duration 8 weeks   PT Treatment/Interventions Manual techniques;Therapeutic exercise;Therapeutic activities;Patient/family education;Passive range of motion;Neuromuscular re-education   PT Next Visit Plan Standard rotator cuff post op protocol per referral   Consulted and Agree with Plan of Care Patient        Problem  List Patient Active Problem List   Diagnosis Date Noted  . Work-related stress 09/08/2013  . Injury of left shoulder 05/05/2013  . Syncope and collapse 05/04/2013    Conetta,Kristyn, PT, MPT 10/18/2014, 9:50 AM  [place] [facility] [address], [place], [address] Phone: [phone]   Fax:  [phone]

## 2014-10-21 ENCOUNTER — Ambulatory Visit: Payer: BLUE CROSS/BLUE SHIELD | Admitting: Physical Therapy

## 2014-10-21 DIAGNOSIS — M25612 Stiffness of left shoulder, not elsewhere classified: Secondary | ICD-10-CM

## 2014-10-21 DIAGNOSIS — Z9889 Other specified postprocedural states: Secondary | ICD-10-CM

## 2014-10-21 DIAGNOSIS — R531 Weakness: Secondary | ICD-10-CM

## 2014-10-21 NOTE — Therapy (Signed)
[facility] [address] [place], [address] Phone: [phone]   Fax:  [phone]  Physical Therapy Treatment  Patient Details  Name: Mark Hernandez. MRN: [id number] Date of Birth: Jan 20, 1961 Referring Provider:  Mikki Harbor, MD  Encounter Date: 10/21/2014      PT End of Session - 10/21/14 1425    Visit Number 4   Number of Visits 17   Date for PT Re-Evaluation 12/03/14   PT Start Time 1345   PT Stop Time 1417   PT Time Calculation (min) 32 min   Activity Tolerance Patient tolerated treatment well;No increased pain   Behavior During Therapy [facility] for tasks assessed/performed      Past Medical History  Diagnosis Date  . Kidney stone   . Rotator cuff tear   . Anxiety     Past Surgical History  Procedure Laterality Date  . Lithotripsy    . Shoulder surgery Bilateral     rotator cuff repair  . Shoulder arthroscopy with open rotator cuff repair Left 09/23/2014    Procedure: SHOULDER ARTHROSCOPY , debridement, decompression, WITH  mini OPEN ROTATOR CUFF REPAIR;  Surgeon: Christena Flake, MD;  Location: [facility];  Service: Orthopedics;  Laterality: Left;    There were no vitals filed for this visit.  Visit Diagnosis:  S/P rotator cuff repair  Decreased ROM of left shoulder  Weakness      Subjective Assessment - 10/21/14 1420    Subjective Patient reports his shoulder is feeling good, has some tenderness over scar.   Pertinent History S/P L rotator cuff repair 09/23/2014. Pt currently 1.5 weeks post op. Originally had L rotator cuff surgery 11/2013, had physical therapy but feels like his L shoulder never healed. Pt does not know of any MD protocols for his L shoulder. Next MD appointment is 10/08/2014.     Limitations House hold activities   Patient Stated Goals "Just to get better. Get my ROM back and my strength."   Currently in Pain? No/denies            Gentle passive range of motion  to left shoulder in flexion and abduction.  Oscillations for relaxation Gentle joint PA mobs to left shoulder x 5 bouts of 30 sec Scar and muscle STM.         PT Education - 10/21/14 1422    Education provided Yes   Education Details proper form with pendulums.   Person(s) Educated Patient   Methods Explanation   Comprehension Verbalized understanding;Returned demonstration             PT Long Term Goals - 10/06/14 1414    PT LONG TERM GOAL #1   Title Patient will be independent with his HEP to promote ROM and ability to raise his arm when appropriate.    Time 8   Period Weeks   Status New   PT LONG TERM GOAL #2   Title Patient will improve L shoulder flexion, scaption, PROM to at least 145 degrees to promote ability to raise his L arm when appropriate.    Time 8   Period Weeks   Status New   PT LONG TERM GOAL #3   Title Patient will improve L shoulder ER PROM to at least 70 degrees to promote ability to raise his arm when appropriate.    Time 8   Period Weeks   Status New   PT LONG TERM GOAL #4  Title Patient will have L shoulder flexion and scaption AAROM to at least 145 degrees to promote ability to raise his arm when appropriate   Time 8   Period Weeks   Status New   PT LONG TERM GOAL #5   Title Patient will have L shoulder flexion and scaption AROM to at least 90 degrees to promote ability to raise his arm.    Time 8   Period Weeks   Status New               Plan - 10/21/14 1426    Clinical Impression Statement Patient reports no pain, continues to have stiffness in all areas of shoulder mobility.    Pt will benefit from skilled therapeutic intervention in order to improve on the following deficits Hypomobility;Other (comment);Impaired flexibility;Decreased strength;Decreased mobility   Rehab Potential Good   Clinical Impairments Affecting Rehab Potential recurrent rotator cuff tear   PT Frequency 2x / week   PT Duration 6 weeks   PT  Treatment/Interventions Manual techniques;Therapeutic exercise;Therapeutic activities;Patient/family education;Passive range of motion;Neuromuscular re-education   PT Next Visit Plan Standard rotator cuff post op protocol per referral   Consulted and Agree with Plan of Care Patient        Problem List Patient Active Problem List   Diagnosis Date Noted  . Work-related stress 09/08/2013  . Injury of left shoulder 05/05/2013  . Syncope and collapse 05/04/2013    Conetta,Kristyn, PT, MPT, GCS 10/21/2014, 2:31 PM  [place] [facility] [address], [place], [address] Phone: [phone]   Fax:  [phone]

## 2014-10-25 ENCOUNTER — Ambulatory Visit: Payer: BLUE CROSS/BLUE SHIELD | Admitting: Physical Therapy

## 2014-10-25 DIAGNOSIS — M25612 Stiffness of left shoulder, not elsewhere classified: Secondary | ICD-10-CM

## 2014-10-25 DIAGNOSIS — Z9889 Other specified postprocedural states: Secondary | ICD-10-CM | POA: Diagnosis not present

## 2014-10-25 DIAGNOSIS — R531 Weakness: Secondary | ICD-10-CM

## 2014-10-25 NOTE — Therapy (Addendum)
[place] [facility] [address] [place], [address] Phone: [phone]   Fax:  [phone]  Physical Therapy Treatment  Patient Details  Name: Mark Hernandez. MRN: [id number] Date of Birth: 04/09/1960 Referring Provider:  Anson [place], [place]*  Encounter Date: 10/25/2014      PT End of Session - 10/25/14 1050    Visit Number 5   Number of Visits 17   Date for PT Re-Evaluation 12/03/14   PT Start Time 1015   PT Stop Time 1055   PT Time Calculation (min) 40 min   Activity Tolerance Patient tolerated treatment well;No increased pain   Behavior During Therapy [facility] for tasks assessed/performed      Past Medical History  Diagnosis Date  . Kidney stone   . Rotator cuff tear   . Anxiety     Past Surgical History  Procedure Laterality Date  . Lithotripsy    . Shoulder surgery Bilateral     rotator cuff repair  . Shoulder arthroscopy with open rotator cuff repair Left 09/23/2014    Procedure: SHOULDER ARTHROSCOPY , debridement, decompression, WITH  mini OPEN ROTATOR CUFF REPAIR;  Surgeon: Christena Flake, MD;  Location: [facility];  Service: Orthopedics;  Laterality: Left;    There were no vitals filed for this visit.  Visit Diagnosis:  S/P rotator cuff repair  Decreased ROM of left shoulder  Weakness      Subjective Assessment - 10/25/14 1016    Subjective Patient reports no pain in his shoulder and that he has not been using it at all. Sling is appropriately donned.    Pertinent History S/P L rotator cuff repair 09/23/2014. Pt currently 4 weeks post op. Originally had L rotator cuff surgery 11/2013, had physical therapy but feels like his L shoulder never healed. Pt does not know of any MD protocols for his L shoulder. Next MD appointment is 10/08/2014.     Limitations House hold activities   Patient Stated Goals "Just to get better. Get my ROM back and my strength."   Currently in Pain? No/denies         Active elbow flexion extension x 10 on LUE  Wrist flexion/extension x 10 on LUE   Passive ROM in sitting IR/ER to neutral    Passive ROM into IR/ER (just past neutral) in supine with arm at 0 degrees abduction   Passive flexion in supine on LUE x 1 minute, well tolerated to ~ 100 degrees prior to resistance being felt   Passive mobilization "floppy fish" to bring patient into abduction x 2 minutes at varying degrees of abduction ~ 70-90 degrees highest tolerated prior to resistance.    Soft tissue mobilization over scar, initially hyper-sensitive, provided education of pain neuroscience around scar and need to continue to provide gentle stimuli to reduce "warning" signals.   Isometric scapular adduction, elevation, depression against manual cuing x 10 of each, required manual cuing and demonstration to perform. Patient seemed to activate all periscapular musculature to complete, will need to work on controlled movements.                           PT Education - 10/25/14 1102    Education provided Yes   Education Details TNE of scar mobilization and decreasing sensitivity. Exercise technique with scapular isometrics.    Person(s) Educated Patient   Methods Explanation;Demonstration;Tactile cues   Comprehension Returned demonstration;Verbalized understanding;Verbal cues  required;Tactile cues required             PT Long Term Goals - 10/06/14 1414    PT LONG TERM GOAL #1   Title Patient will be independent with his HEP to promote ROM and ability to raise his arm when appropriate.    Time 8   Period Weeks   Status New   PT LONG TERM GOAL #2   Title Patient will improve L shoulder flexion, scaption, PROM to at least 145 degrees to promote ability to raise his L arm when appropriate.    Time 8   Period Weeks   Status New   PT LONG TERM GOAL #3   Title Patient will improve L shoulder ER PROM to at least 70 degrees to promote ability to raise his arm when  appropriate.    Time 8   Period Weeks   Status New   PT LONG TERM GOAL #4   Title Patient will have L shoulder flexion and scaption AAROM to at least 145 degrees to promote ability to raise his arm when appropriate   Time 8   Period Weeks   Status New   PT LONG TERM GOAL #5   Title Patient will have L shoulder flexion and scaption AROM to at least 90 degrees to promote ability to raise his arm.    Time 8   Period Weeks   Status New               Plan - 10/25/14 1102    Clinical Impression Statement Patient continues to show increased soft tissue/joint restrictions into flexion, abduction, and ER in particular of his LUE. During scapular isometrics patient performed with increased muscular activation, difficulty isolating target groups. Patient continues to report no pain in his LUE.    Pt will benefit from skilled therapeutic intervention in order to improve on the following deficits Hypomobility;Other (comment);Impaired flexibility;Decreased strength;Decreased mobility   Rehab Potential Good   Clinical Impairments Affecting Rehab Potential recurrent rotator cuff tear   PT Frequency 2x / week   PT Duration 6 weeks   PT Treatment/Interventions Manual techniques;Therapeutic exercise;Therapeutic activities;Patient/family education;Passive range of motion;Neuromuscular re-education   PT Next Visit Plan Standard rotator cuff post op protocol per referral   Consulted and Agree with Plan of Care Patient        Problem List Patient Active Problem List   Diagnosis Date Noted  . Work-related stress 09/08/2013  . Injury of left shoulder 05/05/2013  . Syncope and collapse 05/04/2013   Kerin Ransom, PT, DPT    10/25/2014, 4:02 PM  [place] [facility]TS [facility] [address] [place], [address] Phone: [phone]   Fax:  [phone]     Addended: Charged unit time delegations.

## 2014-10-28 ENCOUNTER — Ambulatory Visit: Payer: BLUE CROSS/BLUE SHIELD | Admitting: Physical Therapy

## 2014-10-28 DIAGNOSIS — Z9889 Other specified postprocedural states: Secondary | ICD-10-CM | POA: Diagnosis not present

## 2014-10-28 NOTE — Therapy (Signed)
[place] [facility] [address], [place], [address] Phone: [phone]   Fax:  [phone]  Physical Therapy Treatment  Patient Details  Name: Mark Hernandez. MRN: [id number] Date of Birth: [birth year] Referring Provider:  Mikki Harbor, MD  Encounter Date: 10/28/2014      PT End of Session - 10/28/14 1051    Visit Number 6   Number of Visits 17   Date for PT Re-Evaluation 12/03/14   PT Start Time 1015   PT Stop Time 1055   PT Time Calculation (min) 40 min   Activity Tolerance Patient tolerated treatment well;No increased pain   Behavior During Therapy [facility] for tasks assessed/performed      Past Medical History  Diagnosis Date  . Kidney stone   . Rotator cuff tear   . Anxiety     Past Surgical History  Procedure Laterality Date  . Lithotripsy    . Shoulder surgery Bilateral     rotator cuff repair  . Shoulder arthroscopy with open rotator cuff repair Left 09/23/2014    Procedure: SHOULDER ARTHROSCOPY , debridement, decompression, WITH  mini OPEN ROTATOR CUFF REPAIR;  Surgeon: Christena Flake, MD;  Location: [facility] ORS;  Service: Orthopedics;  Laterality: Left;    There were no vitals filed for this visit.  Visit Diagnosis:  S/P rotator cuff repair      Subjective Assessment - 10/28/14 1016    Subjective Pt reports he is having much better time with shoulder this time around, reports no pain.   Pertinent History S/P L rotator cuff repair 09/23/2014. Pt currently 4 weeks post op. Originally had L rotator cuff surgery 11/2013, had physical therapy but feels like his L shoulder never healed. Pt does not know of any MD protocols for his L shoulder. Next MD appointment is 10/08/2014.     Limitations House hold activities   Patient Stated Goals "Just to get better. Get my ROM back and my strength."   Currently in Pain? No/denies             Objective: PROM in supine for shoulder flexion, abudction,  scaption, ER, IR. Pain at end range for all motion, ROM improved from 90 to 120 for flexion, IR improved from 0 to 20 degr.   GH joint mobs grade II inferior glides 3x45 sec. Performed at end range of shoulder flexion (pain free).  AAROM with dowel for shoulder flexion 3x10 with cuing to minimize involved UE use.  AAROM seated UE ranger 3x10 forward/back, 3x10 side to side.  Pt demonstrated good performance of limited use AAROM, would benefit from progression to standing AAROM activity.                    PT Education - 10/28/14 1050    Education provided Yes   Education Details educated on initiation of gentle AAROM with factors for discontinuing if painful.   Person(s) Educated Patient   Methods Explanation;Demonstration   Comprehension Verbalized understanding;Returned demonstration             PT Long Term Goals - 10/06/14 1414    PT LONG TERM GOAL #1   Title Patient will be independent with his HEP to promote ROM and ability to raise his arm when appropriate.    Time 8   Period Weeks   Status New   PT LONG TERM GOAL #2   Title Patient will improve L shoulder flexion, scaption, PROM to  at least 145 degrees to promote ability to raise his L arm when appropriate.    Time 8   Period Weeks   Status New   PT LONG TERM GOAL #3   Title Patient will improve L shoulder ER PROM to at least 70 degrees to promote ability to raise his arm when appropriate.    Time 8   Period Weeks   Status New   PT LONG TERM GOAL #4   Title Patient will have L shoulder flexion and scaption AAROM to at least 145 degrees to promote ability to raise his arm when appropriate   Time 8   Period Weeks   Status New   PT LONG TERM GOAL #5   Title Patient will have L shoulder flexion and scaption AROM to at least 90 degrees to promote ability to raise his arm.    Time 8   Period Weeks   Status New               Plan - 10/28/14 1054    Clinical Impression Statement Current ROM  is to 115 shoulder flexion, 20 degrees IR at 90 degrees scaption. Progressed pt to very mild AAROM to progress per JOSPT protocol. Pt would benefit from additional PT to progress post shoulder surgery.   Pt will benefit from skilled therapeutic intervention in order to improve on the following deficits Hypomobility;Other (comment);Impaired flexibility;Decreased strength;Decreased mobility   Rehab Potential Good   Clinical Impairments Affecting Rehab Potential recurrent rotator cuff tear   PT Frequency 2x / week   PT Duration 6 weeks   PT Treatment/Interventions Manual techniques;Therapeutic exercise;Therapeutic activities;Patient/family education;Passive range of motion;Neuromuscular re-education   PT Next Visit Plan Standard rotator cuff post op protocol per referral   Consulted and Agree with Plan of Care Patient        Problem List Patient Active Problem List   Diagnosis Date Noted  . Work-related stress 09/08/2013  . Injury of left shoulder 05/05/2013  . Syncope and collapse 05/04/2013    Mark Hernandez,Mark Hernandez 10/28/2014, 11:45 AM  [place] [facility] [address], [place], [address] Phone: [phone]   Fax:  [phone]

## 2014-11-01 ENCOUNTER — Ambulatory Visit: Payer: BLUE CROSS/BLUE SHIELD | Attending: Family Medicine | Admitting: Physical Therapy

## 2014-11-01 DIAGNOSIS — R531 Weakness: Secondary | ICD-10-CM | POA: Insufficient documentation

## 2014-11-01 DIAGNOSIS — M25612 Stiffness of left shoulder, not elsewhere classified: Secondary | ICD-10-CM

## 2014-11-01 DIAGNOSIS — M7582 Other shoulder lesions, left shoulder: Secondary | ICD-10-CM | POA: Diagnosis present

## 2014-11-01 DIAGNOSIS — Z9889 Other specified postprocedural states: Secondary | ICD-10-CM | POA: Diagnosis present

## 2014-11-01 NOTE — Therapy (Signed)
[facility] [address], [place], [address] Phone: [phone]   Fax:  [phone]  Physical Therapy Treatment  Patient Details  Name: Mark Hernandez. MRN: [id number] Date of Birth: 1960/09/10 Referring Provider:  Mikki Harbor, MD  Encounter Date: 11/01/2014      PT End of Session - 11/01/14 1154    Visit Number 7   Number of Visits 17   Date for PT Re-Evaluation 12/03/14   PT Start Time 1115   PT Stop Time 1153   PT Time Calculation (min) 38 min   Activity Tolerance Patient tolerated treatment well;No increased pain   Behavior During Therapy [facility] for tasks assessed/performed      Past Medical History  Diagnosis Date  . Kidney stone   . Rotator cuff tear   . Anxiety     Past Surgical History  Procedure Laterality Date  . Lithotripsy    . Shoulder surgery Bilateral     rotator cuff repair  . Shoulder arthroscopy with open rotator cuff repair Left 09/23/2014    Procedure: SHOULDER ARTHROSCOPY , debridement, decompression, WITH  mini OPEN ROTATOR CUFF REPAIR;  Surgeon: Christena Flake, MD;  Location: [facility] ORS;  Service: Orthopedics;  Laterality: Left;    There were no vitals filed for this visit.  Visit Diagnosis:  S/P rotator cuff repair  Decreased ROM of left shoulder  Weakness      Subjective Assessment - 11/01/14 1150    Subjective Patient reports he is doing well, min to moderate pain just aching.    Pertinent History S/P L rotator cuff repair 09/23/2014. Pt currently 4 weeks post op. Originally had L rotator cuff surgery 11/2013, had physical therapy but feels like his L shoulder never healed. Pt does not know of any MD protocols for his L shoulder. Next MD appointment is 10/08/2014.     Limitations House hold activities   Patient Stated Goals "Just to get better. Get my ROM back and my strength."   Currently in Pain? Yes   Pain Score 2    Pain Location Shoulder   Pain Orientation Left    Pain Descriptors / Indicators Aching   Pain Onset More than a month ago   Pain Frequency Intermittent   Multiple Pain Sites No         Objective: PROM in supine for shoulder flexion, abudction, scaption, ER, IR. Pain at end range for all motion,   GH joint mobs grade II inferior glides 3x45 sec. Performed at end range of shoulder flexion (pain free).  AAROM with dowel for shoulder flexion, shoulder abduction, ER,  3x10 with cuing to minimize involved UE use.  AAROM seated UE ranger 3x10 forward/back, 3x10 side to side.  2# weights for bicep curls and supination/pronation x 15 reps  Pt demonstrated good performance of limited use AAROM, would benefit from progression to standing AAROM activity.                           PT Education - 11/01/14 1153    Education provided Yes   Education Details gentle AAROM    Person(s) Educated Patient   Methods Explanation;Demonstration   Comprehension Verbalized understanding;Returned demonstration             PT Long Term Goals - 10/06/14 1414    PT LONG TERM GOAL #1   Title Patient will be independent with his HEP to  promote ROM and ability to raise his arm when appropriate.    Time 8   Period Weeks   Status New   PT LONG TERM GOAL #2   Title Patient will improve L shoulder flexion, scaption, PROM to at least 145 degrees to promote ability to raise his L arm when appropriate.    Time 8   Period Weeks   Status New   PT LONG TERM GOAL #3   Title Patient will improve L shoulder ER PROM to at least 70 degrees to promote ability to raise his arm when appropriate.    Time 8   Period Weeks   Status New   PT LONG TERM GOAL #4   Title Patient will have L shoulder flexion and scaption AAROM to at least 145 degrees to promote ability to raise his arm when appropriate   Time 8   Period Weeks   Status New   PT LONG TERM GOAL #5   Title Patient will have L shoulder flexion and scaption AROM to at least 90  degrees to promote ability to raise his arm.    Time 8   Period Weeks   Status New               Plan - 11/01/14 1156    Clinical Impression Statement Patient is progressing well with passive and AAROM    Pt will benefit from skilled therapeutic intervention in order to improve on the following deficits Hypomobility;Other (comment);Impaired flexibility;Decreased strength;Decreased mobility   Rehab Potential Good   PT Frequency 2x / week   PT Duration 6 weeks   PT Treatment/Interventions Manual techniques;Therapeutic exercise;Therapeutic activities;Patient/family education;Passive range of motion;Neuromuscular re-education   PT Next Visit Plan Standard rotator cuff post op protocol per referral   Consulted and Agree with Plan of Care Patient        Problem List Patient Active Problem List   Diagnosis Date Noted  . Work-related stress 09/08/2013  . Injury of left shoulder 05/05/2013  . Syncope and collapse 05/04/2013    Taygen Newsome, PT, MPT, GCS 11/01/2014, 12:01 PM  [facility] [address], [place], [address] Phone: [phone]   Fax:  [phone]

## 2014-11-03 ENCOUNTER — Ambulatory Visit: Payer: BLUE CROSS/BLUE SHIELD | Admitting: Physical Therapy

## 2014-11-03 DIAGNOSIS — M25612 Stiffness of left shoulder, not elsewhere classified: Secondary | ICD-10-CM

## 2014-11-03 DIAGNOSIS — Z9889 Other specified postprocedural states: Secondary | ICD-10-CM | POA: Diagnosis not present

## 2014-11-03 NOTE — Therapy (Signed)
[facility] [address], [place], [address] Phone: [phone]   Fax:  [phone]  Physical Therapy Treatment  Patient Details  Name: Mark Hernandez. MRN: [id number] Date of Birth: [birth year] Referring Provider:  Anson [place], [place]*  Encounter Date: 11/03/2014      PT End of Session - 11/03/14 1048    Visit Number 8   Number of Visits 17   Date for PT Re-Evaluation 12/03/14   PT Start Time 1130   PT Stop Time 1208   PT Time Calculation (min) 38 min   Activity Tolerance Patient tolerated treatment well;No increased pain   Behavior During Therapy [facility] for tasks assessed/performed      Past Medical History  Diagnosis Date  . Kidney stone   . Rotator cuff tear   . Anxiety     Past Surgical History  Procedure Laterality Date  . Lithotripsy    . Shoulder surgery Bilateral     rotator cuff repair  . Shoulder arthroscopy with open rotator cuff repair Left 09/23/2014    Procedure: SHOULDER ARTHROSCOPY , debridement, decompression, WITH  mini OPEN ROTATOR CUFF REPAIR;  Surgeon: Christena Flake, MD;  Location: [facility] ORS;  Service: Orthopedics;  Laterality: Left;    There were no vitals filed for this visit.  Visit Diagnosis:  Decreased ROM of left shoulder      Subjective Assessment - 11/03/14 1033    Subjective Pt reports achiness, no pain.   Pertinent History S/P L rotator cuff repair 09/23/2014. Pt currently 4 weeks post op. Originally had L rotator cuff surgery 11/2013, had physical therapy but feels like his L shoulder never healed. Pt does not know of any MD protocols for his L shoulder. Next MD appointment is 10/08/2014.     Limitations House hold activities   Patient Stated Goals "Just to get better. Get my ROM back and my strength."   Currently in Pain? No/denies   Pain Onset More than a month ago           Objective: Extensive PROM performed with extended holds for shoulder flexion,  abduction, ER, IR. Most limitation with ER and abduction. Motion is limited by soft end feel, appears to be related to tight soft tissue rather than joints.  90 degr. Abduction AP, inferior glides 3x30 reps grade II. Improved ER 15 degr. With this.  Supine wand 3x30 reps press, shoulder flexion.  Standing wand ER 3x30. Extensive cuing with wand exercises to minimize use of involved UE, avoid laterally flexing spine or extending spine to achieve motion.  Seated UE ranger forward/back x2 min, side to side x2 min, alphabet x1.                      PT Education - 11/03/14 1048    Education provided Yes   Education Details incr. AAROM   Person(s) Educated Patient   Methods Explanation   Comprehension Verbalized understanding;Returned demonstration             PT Long Term Goals - 10/06/14 1414    PT LONG TERM GOAL #1   Title Patient will be independent with his HEP to promote ROM and ability to raise his arm when appropriate.    Time 8   Period Weeks   Status New   PT LONG TERM GOAL #2   Title Patient will improve L shoulder flexion, scaption, PROM to at least 145 degrees to  promote ability to raise his L arm when appropriate.    Time 8   Period Weeks   Status New   PT LONG TERM GOAL #3   Title Patient will improve L shoulder ER PROM to at least 70 degrees to promote ability to raise his arm when appropriate.    Time 8   Period Weeks   Status New   PT LONG TERM GOAL #4   Title Patient will have L shoulder flexion and scaption AAROM to at least 145 degrees to promote ability to raise his arm when appropriate   Time 8   Period Weeks   Status New   PT LONG TERM GOAL #5   Title Patient will have L shoulder flexion and scaption AROM to at least 90 degrees to promote ability to raise his arm.    Time 8   Period Weeks   Status New               Plan - 11/03/14 1054    Clinical Impression Statement pt requires cuing to perform AAROM exercises to avoid  compensation with spinal twisting.    Pt will benefit from skilled therapeutic intervention in order to improve on the following deficits Hypomobility;Other (comment);Impaired flexibility;Decreased strength;Decreased mobility   Rehab Potential Good   PT Frequency 2x / week   PT Duration 6 weeks   PT Treatment/Interventions Manual techniques;Therapeutic exercise;Therapeutic activities;Patient/family education;Passive range of motion;Neuromuscular re-education   PT Next Visit Plan Standard rotator cuff post op protocol per referral   Consulted and Agree with Plan of Care Patient        Problem List Patient Active Problem List   Diagnosis Date Noted  . Work-related stress 09/08/2013  . Injury of left shoulder 05/05/2013  . Syncope and collapse 05/04/2013    Fisher,Benjamin 11/03/2014, 11:11 AM  [facility] [address] [place], [address] Phone: [phone]   Fax:  [phone]

## 2014-11-08 ENCOUNTER — Ambulatory Visit: Payer: BLUE CROSS/BLUE SHIELD | Admitting: Physical Therapy

## 2014-11-08 DIAGNOSIS — R531 Weakness: Secondary | ICD-10-CM

## 2014-11-08 DIAGNOSIS — Z9889 Other specified postprocedural states: Secondary | ICD-10-CM | POA: Diagnosis not present

## 2014-11-08 DIAGNOSIS — M25612 Stiffness of left shoulder, not elsewhere classified: Secondary | ICD-10-CM

## 2014-11-08 NOTE — Therapy (Signed)
[facility] [address] [place], [address] Phone: [phone]   Fax:  [phone]  Physical Therapy Treatment  Patient Details  Name: Mark Hernandez. MRN: [id number] Date of Birth: 01/22/1961 Referring Provider:  Anson [place], [place]*  Encounter Date: 11/08/2014      PT End of Session - 11/08/14 1132    Visit Number 9   Number of Visits 17   Date for PT Re-Evaluation 12/03/14   PT Start Time 1100   PT Stop Time 1130   PT Time Calculation (min) 30 min   Activity Tolerance Patient tolerated treatment well;No increased pain   Behavior During Therapy [facility] for tasks assessed/performed      Past Medical History  Diagnosis Date  . Kidney stone   . Rotator cuff tear   . Anxiety     Past Surgical History  Procedure Laterality Date  . Lithotripsy    . Shoulder surgery Bilateral     rotator cuff repair  . Shoulder arthroscopy with open rotator cuff repair Left 09/23/2014    Procedure: SHOULDER ARTHROSCOPY , debridement, decompression, WITH  mini OPEN ROTATOR CUFF REPAIR;  Surgeon: Christena Flake, MD;  Location: [facility] ORS;  Service: Orthopedics;  Laterality: Left;    There were no vitals filed for this visit.  Visit Diagnosis:  Decreased ROM of left shoulder  S/P rotator cuff repair  Weakness      Subjective Assessment - 11/08/14 1130    Subjective Patient reports his left are mis feeling okay. Saw MD last week, states everything went fine at MD. Is only wearing brace as needed now.    Pertinent History S/P L rotator cuff repair 09/23/2014. Pt currently 4 weeks post op. Originally had L rotator cuff surgery 11/2013, had physical therapy but feels like his L shoulder never healed. Pt does not know of any MD protocols for his L shoulder. Next MD appointment is 10/08/2014.     Limitations House hold activities   Patient Stated Goals "Just to get better. Get my ROM back and my strength."   Currently in  Pain? No/denies          Objective: Extensive PROM performed with extended holds for shoulder flexion, abduction, ER, IR. Most limitation with ER and abduction. Motion is limited by soft end feel, appears to be related to tight soft tissue rather than joints.  90 degr. Abduction AP, inferior glides 3x30 reps grade II. Improved ER 15 degr. With this.  Supine wand 3x30 reps press, shoulder flexion, abduction.  Supine left arm stabilization exercises x 1 min.   Seated scapular retraction x 10 with light resistance.   Isometric exercises initiated for extension, flexion, abduction and IR. X 10 each.        PT Education - 11/08/14 1131    Education provided Yes   Education Details added isometric exercises   Person(s) Educated Patient   Methods Explanation;Demonstration   Comprehension Verbalized understanding;Returned demonstration             PT Long Term Goals - 10/06/14 1414    PT LONG TERM GOAL #1   Title Patient will be independent with his HEP to promote ROM and ability to raise his arm when appropriate.    Time 8   Period Weeks   Status New   PT LONG TERM GOAL #2   Title Patient will improve L shoulder flexion, scaption, PROM to at least 145 degrees to promote  ability to raise his L arm when appropriate.    Time 8   Period Weeks   Status New   PT LONG TERM GOAL #3   Title Patient will improve L shoulder ER PROM to at least 70 degrees to promote ability to raise his arm when appropriate.    Time 8   Period Weeks   Status New   PT LONG TERM GOAL #4   Title Patient will have L shoulder flexion and scaption AAROM to at least 145 degrees to promote ability to raise his arm when appropriate   Time 8   Period Weeks   Status New   PT LONG TERM GOAL #5   Title Patient will have L shoulder flexion and scaption AROM to at least 90 degrees to promote ability to raise his arm.    Time 8   Period Weeks   Status New               Plan - 11/08/14 1133     Clinical Impression Statement patient performed isometric this session with cues for correct technique. Pt requires cues for AAROM and exercises.    Pt will benefit from skilled therapeutic intervention in order to improve on the following deficits Hypomobility;Other (comment);Impaired flexibility;Decreased strength;Decreased mobility   Rehab Potential Good   PT Frequency 2x / week   PT Duration 6 weeks   PT Treatment/Interventions Manual techniques;Therapeutic exercise;Therapeutic activities;Patient/family education;Passive range of motion;Neuromuscular re-education   Consulted and Agree with Plan of Care Patient        Problem List Patient Active Problem List   Diagnosis Date Noted  . Work-related stress 09/08/2013  . Injury of left shoulder 05/05/2013  . Syncope and collapse 05/04/2013    Leilanee Righetti, PT, MPT, GCS 11/08/2014, 11:36 AM  [facility] [address], [place], [address] Phone: [phone]   Fax:  [phone]

## 2014-11-10 ENCOUNTER — Ambulatory Visit: Payer: BLUE CROSS/BLUE SHIELD | Admitting: Physical Therapy

## 2014-11-15 ENCOUNTER — Ambulatory Visit: Payer: BLUE CROSS/BLUE SHIELD | Admitting: Physical Therapy

## 2014-11-15 DIAGNOSIS — Z9889 Other specified postprocedural states: Secondary | ICD-10-CM

## 2014-11-15 DIAGNOSIS — M25612 Stiffness of left shoulder, not elsewhere classified: Secondary | ICD-10-CM

## 2014-11-15 DIAGNOSIS — R531 Weakness: Secondary | ICD-10-CM

## 2014-11-15 NOTE — Therapy (Signed)
[facility] [address] [place], [address] Phone: [phone]   Fax:  [phone]  Physical Therapy Treatment  Patient Details  Name: Mark Hernandez. MRN: [id number] Date of Birth: April 10, 1960 No Data Recorded  Encounter Date: 11/15/2014      PT End of Session - 11/15/14 1159    Visit Number 10   Number of Visits 17   Date for PT Re-Evaluation 12/03/14   PT Start Time 1115   PT Stop Time 1145   PT Time Calculation (min) 30 min   Activity Tolerance Patient tolerated treatment well;No increased pain   Behavior During Therapy [facility] for tasks assessed/performed      Past Medical History  Diagnosis Date  . Kidney stone   . Rotator cuff tear   . Anxiety     Past Surgical History  Procedure Laterality Date  . Lithotripsy    . Shoulder surgery Bilateral     rotator cuff repair  . Shoulder arthroscopy with open rotator cuff repair Left 09/23/2014    Procedure: SHOULDER ARTHROSCOPY , debridement, decompression, WITH  mini OPEN ROTATOR CUFF REPAIR;  Surgeon: Christena Flake, MD;  Location: [facility] ORS;  Service: Orthopedics;  Laterality: Left;    There were no vitals filed for this visit.  Visit Diagnosis:  Decreased ROM of left shoulder  S/P rotator cuff repair  Weakness      Subjective Assessment - 11/15/14 1154    Subjective Patient reports he is doing okay. No real pain to reports, soreness.    Pertinent History S/P L rotator cuff repair 09/23/2014. Pt currently 4 weeks post op. Originally had L rotator cuff surgery 11/2013, had physical therapy but feels like his L shoulder never healed. Pt does not know of any MD protocols for his L shoulder. Next MD appointment is 10/08/2014.     Limitations House hold activities   Patient Stated Goals "Just to get better. Get my ROM back and my strength."   Currently in Pain? No/denies   Pain Score 0-No pain   Pain Location Shoulder   Pain Orientation Left   Pain  Descriptors / Indicators Aching   Pain Onset More than a month ago   Pain Frequency Intermittent   Multiple Pain Sites No          Objective:  Extensive PROM performed with extended holds for shoulder flexion, abduction, ER, IR. Most limitation with ER and abduction. Motion is limited by soft end feel, appears to be related to tight soft tissue rather than joints.  90 degr. Abduction AP, inferior glides 3x30 reps grade II. Improved ER 15 degr. With this.  Supine wand 3x30 reps press, shoulder flexion, 20x IR, 20x [place] extension.  Standing wand ER 3x30. Extensive cuing with wand exercises to minimize use of involved UE, avoid laterally flexing spine or extending spine to achieve motion.  Seated UE ranger forward/back x2 min, side to side x2 min, alphabet x1.   Isometric shoulder flexion, IR,  abduction and extension x 10 each           PT Education - 11/15/14 1157    Education provided Yes   Education Details isometrics   Person(s) Educated Patient   Methods Explanation;Demonstration   Comprehension Verbalized understanding             PT Long Term Goals - 10/06/14 1414    PT LONG TERM GOAL #1   Title Patient will be independent with  his HEP to promote ROM and ability to raise his arm when appropriate.    Time 8   Period Weeks   Status New   PT LONG TERM GOAL #2   Title Patient will improve L shoulder flexion, scaption, PROM to at least 145 degrees to promote ability to raise his L arm when appropriate.    Time 8   Period Weeks   Status New   PT LONG TERM GOAL #3   Title Patient will improve L shoulder ER PROM to at least 70 degrees to promote ability to raise his arm when appropriate.    Time 8   Period Weeks   Status New   PT LONG TERM GOAL #4   Title Patient will have L shoulder flexion and scaption AAROM to at least 145 degrees to promote ability to raise his arm when appropriate   Time 8   Period Weeks   Status New   PT LONG TERM GOAL #5    Title Patient will have L shoulder flexion and scaption AROM to at least 90 degrees to promote ability to raise his arm.    Time 8   Period Weeks   Status New               Plan - 11/15/14 1200    Clinical Impression Statement Patient requires cues for isometric exercises, progressing well with range of motion   Pt will benefit from skilled therapeutic intervention in order to improve on the following deficits Hypomobility;Other (comment);Impaired flexibility;Decreased strength;Decreased mobility   Rehab Potential Good   Clinical Impairments Affecting Rehab Potential recurrent rotator cuff tear   PT Frequency 2x / week   PT Duration 6 weeks   PT Treatment/Interventions Manual techniques;Therapeutic exercise;Therapeutic activities;Patient/family education;Passive range of motion;Neuromuscular re-education   PT Next Visit Plan Standard rotator cuff post op protocol per referral   Consulted and Agree with Plan of Care Patient        Problem List Patient Active Problem List   Diagnosis Date Noted  . Work-related stress 09/08/2013  . Injury of left shoulder 05/05/2013  . Syncope and collapse 05/04/2013    Allaya Abbasi, PT, MPT, GCS 11/15/2014, 12:05 PM  [facility] [address] [place], [place], [address] Phone: [phone]   Fax:  [phone]  Name: Mark Connersony G Hijazi Jr. MRN: [id number] Date of Birth: March 02, 1960

## 2014-11-17 ENCOUNTER — Ambulatory Visit: Payer: BLUE CROSS/BLUE SHIELD | Admitting: Physical Therapy

## 2014-11-17 DIAGNOSIS — Z9889 Other specified postprocedural states: Secondary | ICD-10-CM | POA: Diagnosis not present

## 2014-11-17 DIAGNOSIS — M25612 Stiffness of left shoulder, not elsewhere classified: Secondary | ICD-10-CM

## 2014-11-17 NOTE — Therapy (Signed)
[place] [facility] PH[facility] AND [facility] MEDICINE [address] [place], [place], [address] Phone: [phone]   Fax:  [phone]  Physical Therapy Treatment  Patient Details  Name: Mark Connersony G Chamberland Jr. MRN: [id number] Date of Birth: 1960-09-11 No Data Recorded  Encounter Date: 11/17/2014      PT End of Session - 11/17/14 1210    Visit Number 11   Number of Visits 17   Date for PT Re-Evaluation 12/03/14   PT Start Time 1115   PT Stop Time 1140   PT Time Calculation (min) 25 min   Activity Tolerance Patient tolerated treatment well;No increased pain   Behavior [facility] assessed/performed      Past Medical History  Diagnosis Date  . Kidney stone   . Rotator cuff tear   . Anxiety     Past Surgical History  Procedure Laterality Date  . Lithotripsy    . Shoulder surgery Bilateral     rotator cuff repair  . Shoulder arthroscopy with open rotator cuff repair Left 09/23/2014    Procedure: SHOULDER ARTHROSCOPY , debridement, decompression, WITH  mini OPEN ROTATOR CUFF REPAIR;  Surgeon: Christena FlakeJohn J Poggi, MD;  Location: [facility] ORS;  Service: Orthopedics;  Laterality: Left;    There were no vitals filed for this visit.  Visit Diagnosis:  Decreased ROM of left shoulder      Subjective Assessment - 11/17/14 1210    Subjective Patient reports he is doing okay. No real pain to reports, soreness.    Pertinent History S/P L rotator cuff repair 09/23/2014. Pt currently 4 weeks post op. Originally had L rotator cuff surgery 11/2013, had physical therapy but feels like his L shoulder never healed. Pt does not know of any MD protocols for his L shoulder. Next MD appointment is 10/08/2014.     Limitations House hold activities   Patient Stated Goals "Just to get better. Get my ROM back and my strength."   Currently in Pain? No/denies   Pain Onset More than a month ago      Objectie: PROm for shoulder flexion, abduction, ER/IR performed (10  min)  Inferior glides performed at end range for shoulder flexion x4 min total.  UE ranger shoulder flexion 3x20, arc 3x20.  Manual and verbal cuing with UE ranger use as pt initially shrugged shoulder with performance.  Scar massage with vitamin E lotion.  Applied ice (no charge) following session for 5 min. Pt reported this helped with incr. "inflammation" following session.                           PT Education - 11/17/14 1210    Education provided Yes   Education Details avoiding active use for a few more days   Person(s) Educated Patient   Methods Explanation   Comprehension Verbalized understanding             PT Long Term Goals - 10/06/14 1414    PT LONG TERM GOAL #1   Title Patient will be independent with his HEP to promote ROM and ability to raise his arm when appropriate.    Time 8   Period Weeks   Status New   PT LONG TERM GOAL #2   Title Patient will improve L shoulder flexion, scaption, PROM to at least 145 degrees to promote ability to raise his L arm when appropriate.    Time 8   Period Weeks  Status New   PT LONG TERM GOAL #3   Title Patient will improve L shoulder ER PROM to at least 70 degrees to promote ability to raise his arm when appropriate.    Time 8   Period Weeks   Status New   PT LONG TERM GOAL #4   Title Patient will have L shoulder flexion and scaption AAROM to at least 145 degrees to promote ability to raise his arm when appropriate   Time 8   Period Weeks   Status New   PT LONG TERM GOAL #5   Title Patient will have L shoulder flexion and scaption AROM to at least 90 degrees to promote ability to raise his arm.    Time 8   Period Weeks   Status New               Plan - 11/17/14 1211    Clinical Impression Statement Pt requires cuing for scapulohumeral rhythm with UE ranger, requested ice and did well with use of ice.   Pt will benefit from skilled therapeutic intervention in order to improve on  the following deficits Hypomobility;Other (comment);Impaired flexibility;Decreased strength;Decreased mobility   Rehab Potential Good   Clinical Impairments Affecting Rehab Potential recurrent rotator cuff tear   PT Frequency 2x / week   PT Duration 6 weeks   PT Treatment/Interventions Manual techniques;Therapeutic exercise;Therapeutic activities;Patient/family education;Passive range of motion;Neuromuscular re-education   PT Next Visit Plan Standard rotator cuff post op protocol per referral   Consulted and Agree with Plan of Care Patient        Problem List Patient Active Problem List   Diagnosis Date Noted  . Work-related stress 09/08/2013  . Injury of left shoulder 05/05/2013  . Syncope and collapse 05/04/2013    Fisher,Benjamin 11/17/2014, 12:13 PM  [place] [facility] PHYSICAL AND SPORTS MEDICINE [address], [place], [address] Phone: [phone]   Fax:  [phone]  Name: Mark Matich. MRN: [id number] Date of Birth: 1960-12-25

## 2014-11-25 ENCOUNTER — Ambulatory Visit: Payer: BLUE CROSS/BLUE SHIELD | Admitting: Physical Therapy

## 2014-11-25 DIAGNOSIS — Z9889 Other specified postprocedural states: Secondary | ICD-10-CM

## 2014-11-25 DIAGNOSIS — M25612 Stiffness of left shoulder, not elsewhere classified: Secondary | ICD-10-CM

## 2014-11-25 DIAGNOSIS — R531 Weakness: Secondary | ICD-10-CM

## 2014-11-25 NOTE — Therapy (Signed)
[facility] [address] [place], [address] Phone: [phone]   Fax:  [phone]  Physical Therapy Treatment  Patient Details  Name: Mark Hernandez. MRN: [id number] Date of Birth: 1960-10-27 No Data Recorded  Encounter Date: 11/25/2014      PT End of Session - 11/25/14 1120    Visit Number 12   Number of Visits 17   Date for PT Re-Evaluation 12/03/14   PT Start Time 1032   PT Stop Time 1104   PT Time Calculation (min) 32 min   Activity Tolerance Patient tolerated treatment well;No increased pain   Behavior During Therapy [facility] for tasks assessed/performed      Past Medical History  Diagnosis Date  . Kidney stone   . Rotator cuff tear   . Anxiety     Past Surgical History  Procedure Laterality Date  . Lithotripsy    . Shoulder surgery Bilateral     rotator cuff repair  . Shoulder arthroscopy with open rotator cuff repair Left 09/23/2014    Procedure: SHOULDER ARTHROSCOPY , debridement, decompression, WITH  mini OPEN ROTATOR CUFF REPAIR;  Surgeon: Christena Flake, MD;  Location: [facility] ORS;  Service: Orthopedics;  Laterality: Left;    There were no vitals filed for this visit.  Visit Diagnosis:  Decreased ROM of left shoulder  S/P rotator cuff repair  Weakness      Subjective Assessment - 11/25/14 1032    Subjective Pt reports shoulder is feeling pretty good. He reports a little soreness, achiness. He initially doffs sweater    Pertinent History S/P L rotator cuff repair 09/23/2014. Pt currently 4 weeks post op. Originally had L rotator cuff surgery 11/2013, had physical therapy but feels like his L shoulder never healed. Pt does not know of any MD protocols for his L shoulder. Next MD appointment is 10/08/2014.     Patient Stated Goals "Just to get better. Get my ROM back and my strength."   Currently in Pain? No/denies         TherEx  Seated IR/ER with wand (no shrugging noted) x 20 each  way   UE ranger arcs, flexion/extension 2x20 for each, manual cuing for not having upper trap substitution.    Wand exercises for adduction/abduction, supine flexion, chest press x 20 repetitions no increase in symptoms noted.   Manual    PROM for shoulder flexion, ER, adduction (cross-body stretch) x 1-2 minutes for each                      PT Education - 11/25/14 1119    Education provided Yes   Education Details Limiting his use of LUE with donning/doffing clothing and limiting use of LUE to continue protection of RTC repair.    Person(s) Educated Patient   Methods Explanation;Demonstration;Verbal cues;Tactile cues   Comprehension Verbalized understanding;Returned demonstration;Tactile cues required;Verbal cues required             PT Long Term Goals - 10/06/14 1414    PT LONG TERM GOAL #1   Title Patient will be independent with his HEP to promote ROM and ability to raise his arm when appropriate.    Time 8   Period Weeks   Status New   PT LONG TERM GOAL #2   Title Patient will improve L shoulder flexion, scaption, PROM to at least 145 degrees to promote ability to raise his L arm when appropriate.  Time 8   Period Weeks   Status New   PT LONG TERM GOAL #3   Title Patient will improve L shoulder ER PROM to at least 70 degrees to promote ability to raise his arm when appropriate.    Time 8   Period Weeks   Status New   PT LONG TERM GOAL #4   Title Patient will have L shoulder flexion and scaption AAROM to at least 145 degrees to promote ability to raise his arm when appropriate   Time 8   Period Weeks   Status New   PT LONG TERM GOAL #5   Title Patient will have L shoulder flexion and scaption AROM to at least 90 degrees to promote ability to raise his arm.    Time 8   Period Weeks   Status New               Plan - 11/25/14 1123    Clinical Impression Statement Patient appears to have been actively using LUE more than recommended,  noted doffing sweater and educated to remain limited with activity until discussed with primary therapist. Patient completes PROM with no pain in flexion and very  close to symmetrical ROM with wand flexion.Patient displays improved mechanics with UE ranger, minimal cuing to avoid shrugging noted today.    Pt will benefit from skilled therapeutic intervention in order to improve on the following deficits Hypomobility;Other (comment);Impaired flexibility;Decreased strength;Decreased mobility   Rehab Potential Good   Clinical Impairments Affecting Rehab Potential recurrent rotator cuff tear   PT Frequency 2x / week   PT Duration 6 weeks   PT Treatment/Interventions Manual techniques;Therapeutic exercise;Therapeutic activities;Patient/family education;Passive range of motion;Neuromuscular re-education   PT Next Visit Plan Standard rotator cuff post op protocol per referral   Consulted and Agree with Plan of Care Patient        Problem List Patient Active Problem List   Diagnosis Date Noted  . Work-related stress 09/08/2013  . Injury of left shoulder 05/05/2013  . Syncope and collapse 05/04/2013   Kerin RansomPatrick A Norell Brisbin, PT, DPT   11/25/2014, 11:27 AM  [place] [facility] [address] [place], [place], [address] Phone: [phone]   Fax:  [phone]  Name: Mark Connersony G Decoteau Jr. MRN: [id number] Date of Birth: 1960-03-27

## 2014-11-29 ENCOUNTER — Encounter: Payer: BLUE CROSS/BLUE SHIELD | Admitting: Physical Therapy

## 2014-11-30 ENCOUNTER — Ambulatory Visit: Payer: BLUE CROSS/BLUE SHIELD | Attending: Student | Admitting: Physical Therapy

## 2014-11-30 DIAGNOSIS — Z9889 Other specified postprocedural states: Secondary | ICD-10-CM | POA: Insufficient documentation

## 2014-11-30 DIAGNOSIS — M7582 Other shoulder lesions, left shoulder: Secondary | ICD-10-CM | POA: Diagnosis not present

## 2014-11-30 DIAGNOSIS — R531 Weakness: Secondary | ICD-10-CM | POA: Diagnosis present

## 2014-11-30 DIAGNOSIS — M25612 Stiffness of left shoulder, not elsewhere classified: Secondary | ICD-10-CM

## 2014-11-30 NOTE — Therapy (Signed)
[place] PHYSICAL AND SPORTS MEDICINE [address], [place], [address] Phone: [phone]   Fax:  [phone]  Physical Therapy Treatment  Patient Details  Name: Mark Hernandez. MRN: [id number] Date of Birth: 1961/01/11 No Data Recorded  Encounter Date: 11/30/2014      PT End of Session - 11/30/14 0835    Visit Number 13   Number of Visits 17   Date for PT Re-Evaluation 12/03/14   PT Start Time 0825   PT Stop Time 0903   PT Time Calculation (min) 38 min   Activity Tolerance Patient tolerated treatment well;No increased pain   Behavior During Therapy [facility] for tasks assessed/performed      Past Medical History  Diagnosis Date  . Kidney stone   . Rotator cuff tear   . Anxiety     Past Surgical History  Procedure Laterality Date  . Lithotripsy    . Shoulder surgery Bilateral     rotator cuff repair  . Shoulder arthroscopy with open rotator cuff repair Left 09/23/2014    Procedure: SHOULDER ARTHROSCOPY , debridement, decompression, WITH  mini OPEN ROTATOR CUFF REPAIR;  Surgeon: Corky Mull, MD;  Location: [facility]S;  Service: Orthopedics;  Laterality: Left;    There were no vitals filed for this visit.  Visit Diagnosis:  Decreased ROM of left shoulder  Weakness      Subjective Assessment - 11/30/14 0827    Subjective Pt reports no constant pain, he gets inconsistent achiness. Is trying to use his UE more with no difficulty.   Pertinent History S/P L rotator cuff repair 09/23/2014. Pt currently 4 weeks post op. Originally had L rotator cuff surgery 11/2013, had physical therapy but feels like his L shoulder never healed. Pt does not know of any MD protocols for his L shoulder. Next MD appointment is 10/08/2014.     Patient Stated Goals "Just to get better. Get my ROM back and my strength."   Currently in Pain? No/denies            Objective: Manual stretching for shoulder flexion, abduction, Er, IR all with  sustained holds of 1 min x3.  Followign this AROM improved from 120 to 130 for shoulder flexion, 110 to 120 for abduction, no change for ER and IR.  Weighted holds to work on supine flexion, supine ER, and horizontal adduction using 3 and 5# wts, respectively, 10 min total including instruction time.  Blue progressing to black theraband 3x20 scapular retraction, scapular low rows. Cuing for upright posture, tapping for muscle activation of rhomboid.  OMEGA shoulder press with 5# (offset) 3x8.  2# ball overhead dribble against wall 3x1 min with cuing to avoid lumbar compensation.  Pt extremely fatigued in shoulders following session, no pain.                      PT Education - 11/30/14 0835    Education provided Yes   Education Details using light weight for stretching.   Person(s) Educated Patient   Methods Explanation   Comprehension Verbalized understanding             PT Long Term Goals - 11/30/14 0906    PT LONG TERM GOAL #1   Title Patient will be independent with his HEP to promote ROM and ability to raise his arm when appropriate.    Time 8   Period Weeks   Status Achieved   PT LONG TERM GOAL #  2   Title Patient will improve L shoulder flexion, scaption, PROM to at least 145 degrees to promote ability to raise his L arm when appropriate.    Time 8   Period Weeks   Status Achieved   PT LONG TERM GOAL #3   Title Patient will improve L shoulder ER PROM to at least 70 degrees to promote ability to raise his arm when appropriate.    Time 8   Period Weeks   Status Not Met   PT LONG TERM GOAL #4   Title Patient will have L shoulder flexion and scaption AAROM to at least 145 degrees to promote ability to raise his arm when appropriate   Time 8   Period Weeks   Status New   PT LONG TERM GOAL #5   Title Patient will have L shoulder flexion and scaption AROM to at least 90 degrees to promote ability to raise his arm.    Time 8   Period Weeks   Status  New               Plan - 11/30/14 0902    Clinical Impression Statement Pt tolerated progression to use of weiight for sustained stretch/holds as well as very light strengthening. ROM is now to 150 passively, 130 active for shoulder flexion, abduction is approx. 10 degrees off of this. IR is WNL and ER is to 40 degr. A/PROM. Pt is signiificantly weak though this is to be expected as pt has not been using his UE.for over 2 months.    Pt will benefit from skilled therapeutic intervention in order to improve on the following deficits Hypomobility;Other (comment);Impaired flexibility;Decreased strength;Decreased mobility   Rehab Potential Good   Clinical Impairments Affecting Rehab Potential recurrent rotator cuff tear   PT Frequency 2x / week   PT Duration 6 weeks   PT Treatment/Interventions Manual techniques;Therapeutic exercise;Therapeutic activities;Patient/family education;Passive range of motion;Neuromuscular re-education   PT Next Visit Plan Standard rotator cuff post op protocol per referral   Consulted and Agree with Plan of Care Patient        Problem List Patient Active Problem List   Diagnosis Date Noted  . Work-related stress 09/08/2013  . Injury of left shoulder 05/05/2013  . Syncope and collapse 05/04/2013    Cherryl Babin 11/30/2014, 9:07 AM  [place] PHYSICAL AND SPORTS MEDICINE [address] [place], [address] Phone: [phone]   Fax:  [phone]  Name: Kayleb Warshaw. MRN: [id number] Date of Birth: 1960-08-13

## 2014-12-02 ENCOUNTER — Ambulatory Visit: Payer: BLUE CROSS/BLUE SHIELD | Admitting: Physical Therapy

## 2014-12-02 DIAGNOSIS — Z9889 Other specified postprocedural states: Secondary | ICD-10-CM

## 2014-12-02 DIAGNOSIS — M25612 Stiffness of left shoulder, not elsewhere classified: Secondary | ICD-10-CM

## 2014-12-02 DIAGNOSIS — M7582 Other shoulder lesions, left shoulder: Secondary | ICD-10-CM | POA: Diagnosis not present

## 2014-12-02 DIAGNOSIS — R531 Weakness: Secondary | ICD-10-CM

## 2014-12-02 NOTE — Patient Instructions (Signed)
  AROM L shoulder flexion 140 degrees, scaption 125 degrees, AAROM flexion 140 degrees flexion  Supine static holds with 3# DB in shoulder flexion x 10 repetitions for 10" holds, felt stretch sensation appropriately   Cross body posterior capsular stretch x 10 repetitions for 2 sets, firm end feel noted. No increase in symptoms.   Chest press with 3# DB x 15 repetitions, no fatigue. Increased to 5# dumbbell x 12 repetitions for 2 sets (for serratus strengthening)   OMEGA rows 5# x 15, 10# x 12, 10# x 12  High to low rows with red t-band 3 sets x 15 repetitions ("this actually feels pretty good")

## 2014-12-02 NOTE — Therapy (Signed)
[place] PHYSICAL AND SPORTS MEDICINE [address] [place], [address] Phone: [phone]   Fax:  [phone]  Physical Therapy Treatment  Patient Details  Name: Mark Hernandez. MRN: [id number] Date of Birth: 1960-12-26 No Data Recorded  Encounter Date: 12/02/2014      PT End of Session - 12/02/14 1113    Visit Number 14   Number of Visits 17   Date for PT Re-Evaluation 12/30/14   PT Start Time 3299   PT Stop Time 1105   PT Time Calculation (min) 33 min   Activity Tolerance Patient tolerated treatment well;No increased pain   Behavior During Therapy [facility] for tasks assessed/performed      Past Medical History  Diagnosis Date  . Kidney stone   . Rotator cuff tear   . Anxiety     Past Surgical History  Procedure Laterality Date  . Lithotripsy    . Shoulder surgery Bilateral     rotator cuff repair  . Shoulder arthroscopy with open rotator cuff repair Left 09/23/2014    Procedure: SHOULDER ARTHROSCOPY , debridement, decompression, WITH  mini OPEN ROTATOR CUFF REPAIR;  Surgeon: Corky Mull, MD;  Location: [facility] ORS;  Service: Orthopedics;  Laterality: Left;    There were no vitals filed for this visit.  Visit Diagnosis:  S/P rotator cuff repair - Plan: PT plan of care cert/re-cert  Decreased ROM of left shoulder - Plan: PT plan of care cert/re-cert  Weakness - Plan: PT plan of care cert/re-cert      Subjective Assessment - 12/02/14 1037    Subjective Patient reports achiness after last session, no real pain. That resolved and today presents with no complaints. Able to doff sweater with no pain increase.    Pertinent History S/P L rotator cuff repair 09/23/2014. Pt currently 4 weeks post op. Originally had L rotator cuff surgery 11/2013, had physical therapy but feels like his L shoulder never healed. Pt does not know of any MD protocols for his L shoulder. Next MD appointment is 10/08/2014.     Limitations House hold  activities   Patient Stated Goals "Just to get better. Get my ROM back and my strength."   Currently in Pain? No/denies        AROM L shoulder flexion 140 degrees, scaption 125 degrees, AAROM flexion 140 degrees flexion  Supine static holds with 3# DB in shoulder flexion x 10 repetitions for 10" holds, felt stretch sensation appropriately   Cross body posterior capsular stretch x 10 repetitions for 2 sets, firm end feel noted. No increase in symptoms.   ER manual stretching in 0-40 degrees of abduction in supine in pain free range x 2 minutes.   Chest press with 3# DB x 15 repetitions, no fatigue. Increased to 5# dumbbell x 12 repetitions for 2 sets (for serratus strengthening)   OMEGA rows 5# x 15, 10# x 12, 10# x 12  High to low rows with red t-band 3 sets x 15 repetitions ("this actually feels pretty good")                          PT Education - 12/02/14 1113    Education provided Yes   Education Details Exercise techniques, need for cautious return to use of LUE   Person(s) Educated Patient   Methods Explanation;Demonstration;Verbal cues   Comprehension Returned demonstration;Verbalized understanding  PT Long Term Goals - 12/02/14 1052    PT LONG TERM GOAL #1   Title Patient will be independent with his HEP to promote ROM and ability to raise his arm when appropriate.    Time 8   Period Weeks   Status Achieved   PT LONG TERM GOAL #2   Title Patient will improve L shoulder flexion, scaption, PROM to at least 145 degrees to promote ability to raise his L arm when appropriate.    Time 8   Period Weeks   Status Achieved   PT LONG TERM GOAL #3   Title Patient will improve L shoulder ER PROM to at least 70 degrees to promote ability to raise his arm when appropriate.    Time 8   Period Weeks   Status Not Met   PT LONG TERM GOAL #4   Title Patient will have L shoulder flexion and scaption AAROM to at least 145 degrees to promote  ability to raise his arm when appropriate   Baseline 140 degrees flexion   Time 8   Period Weeks   Status Partially Met   PT LONG TERM GOAL #5   Title Patient will have L shoulder flexion and scaption AROM to at least 90 degrees to promote ability to raise his arm.    Baseline 140 degrees of flexion, 125 degrees of scaption    Time 8   Period Weeks   Status Achieved               Plan - 12/02/14 1114    Clinical Impression Statement Patient demonstrates appropriate technique with rowing, chest press, and stretching exercises provided today. He is able to achieve 140 degrees of active and active assistive shoulder flexion. Patient demonstrates weakness and fatigue with exercises in this session, and would benefit from additional PT services to gradually progress strengthening routine for full return to duty.    Pt will benefit from skilled therapeutic intervention in order to improve on the following deficits Hypomobility;Other (comment);Impaired flexibility;Decreased strength;Decreased mobility   Rehab Potential Good   Clinical Impairments Affecting Rehab Potential recurrent rotator cuff tear   PT Frequency 2x / week   PT Duration 6 weeks   PT Treatment/Interventions Manual techniques;Therapeutic exercise;Therapeutic activities;Patient/family education;Passive range of motion;Neuromuscular re-education   PT Next Visit Plan Standard rotator cuff post op protocol per referral   Consulted and Agree with Plan of Care Patient        Problem List Patient Active Problem List   Diagnosis Date Noted  . Work-related stress 09/08/2013  . Injury of left shoulder 05/05/2013  . Syncope and collapse 05/04/2013   Kerman Passey, PT, DPT    12/02/2014, 2:09 PM  [place] PHYSICAL AND SPORTS MEDICINE [address], [place], [address] Phone: [phone]   Fax:  [phone]  Name: Mark Hernandez. MRN: [id number] Date of Birth:  November 08, 1960

## 2014-12-06 ENCOUNTER — Encounter: Payer: BLUE CROSS/BLUE SHIELD | Admitting: Physical Therapy

## 2014-12-06 ENCOUNTER — Ambulatory Visit: Payer: BLUE CROSS/BLUE SHIELD | Admitting: Physical Therapy

## 2014-12-06 DIAGNOSIS — M7582 Other shoulder lesions, left shoulder: Secondary | ICD-10-CM | POA: Diagnosis not present

## 2014-12-06 DIAGNOSIS — R531 Weakness: Secondary | ICD-10-CM

## 2014-12-06 NOTE — Therapy (Signed)
[facility] [address], [place], [address] Phone: [phone]   Fax:  [phone]  Physical Therapy Treatment  Patient Details  Name: Mark Hernandez. MRN: [id number] Date of Birth: Oct 05, 1960 No Data Recorded  Encounter Date: 12/06/2014      PT End of Session - 12/06/14 1156    Visit Number 15   Number of Visits 17   Date for PT Re-Evaluation 12/30/14   PT Start Time 1030   PT Stop Time 1110   PT Time Calculation (min) 40 min   Activity Tolerance Patient tolerated treatment well;No increased pain   Behavior During Therapy [facility] for tasks assessed/performed      Past Medical History  Diagnosis Date  . Kidney stone   . Rotator cuff tear   . Anxiety     Past Surgical History  Procedure Laterality Date  . Lithotripsy    . Shoulder surgery Bilateral     rotator cuff repair  . Shoulder arthroscopy with open rotator cuff repair Left 09/23/2014    Procedure: SHOULDER ARTHROSCOPY , debridement, decompression, WITH  mini OPEN ROTATOR CUFF REPAIR;  Surgeon: Corky Mull, MD;  Location: [facility] ORS;  Service: Orthopedics;  Laterality: Left;    There were no vitals filed for this visit.  Visit Diagnosis:  Weakness      Subjective Assessment - 12/06/14 1035    Subjective Patient reports achiness after last session, no real pain. That resolved and today presents with no complaints. Able to doff sweater with no pain increase.    Pertinent History S/P L rotator cuff repair 09/23/2014. Pt currently 4 weeks post op. Originally had L rotator cuff surgery 11/2013, had physical therapy but feels like his L shoulder never healed. Pt does not know of any MD protocols for his L shoulder. Next MD appointment is 10/08/2014.     Limitations House hold activities   Patient Stated Goals "Just to get better. Get my ROM back and my strength."   Currently in Pain? No/denies          Objective: YTB ER with single band  (attempted doubled band but c/o pain) 3x15.  RTB scapular retractions 3x10.  OMEGA 10 # retractions 3x15  UE ranger 3x20 shoulder flexion, shoulder arcs.  Ball overhead taps, yellow band flossing, ball rolling overhead all to address overhead weakness.   Pt extremely fatigued, required little cuing though indicating he is appropriate for d/c in the near future.  Supine ER, IR stretching manually to end range, improved ER to 40 degr. In scapular plane.                            PT Long Term Goals - 12/02/14 1052    PT LONG TERM GOAL #1   Title Patient will be independent with his HEP to promote ROM and ability to raise his arm when appropriate.    Time 8   Period Weeks   Status Achieved   PT LONG TERM GOAL #2   Title Patient will improve L shoulder flexion, scaption, PROM to at least 145 degrees to promote ability to raise his L arm when appropriate.    Time 8   Period Weeks   Status Achieved   PT LONG TERM GOAL #3   Title Patient will improve L shoulder ER PROM to at least 70 degrees to promote ability to raise his arm when appropriate.  Time 8   Period Weeks   Status Not Met   PT LONG TERM GOAL #4   Title Patient will have L shoulder flexion and scaption AAROM to at least 145 degrees to promote ability to raise his arm when appropriate   Baseline 140 degrees flexion   Time 8   Period Weeks   Status Partially Met   PT LONG TERM GOAL #5   Title Patient will have L shoulder flexion and scaption AROM to at least 90 degrees to promote ability to raise his arm.    Baseline 140 degrees of flexion, 125 degrees of scaption    Time 8   Period Weeks   Status Achieved               Plan - 12/06/14 1156    Clinical Impression Statement Pt continues to make progress per protocol with strengthening and ROM.   Pt will benefit from skilled therapeutic intervention in order to improve on the following deficits Hypomobility;Other (comment);Impaired  flexibility;Decreased strength;Decreased mobility   Rehab Potential Good   Clinical Impairments Affecting Rehab Potential recurrent rotator cuff tear   PT Frequency 2x / week   PT Duration 6 weeks   PT Treatment/Interventions Manual techniques;Therapeutic exercise;Therapeutic activities;Patient/family education;Passive range of motion;Neuromuscular re-education   PT Next Visit Plan Standard rotator cuff post op protocol per referral   Consulted and Agree with Plan of Care Patient        Problem List Patient Active Problem List   Diagnosis Date Noted  . Work-related stress 09/08/2013  . Injury of left shoulder 05/05/2013  . Syncope and collapse 05/04/2013    Mark Hernandez PT 12/06/2014, 11:59 AM  [facility] [address], [place], [address] Phone: [phone]   Fax:  [phone]  Name: Mark Hernandez. MRN: [id number] Date of Birth: February 10, 1960

## 2014-12-08 ENCOUNTER — Encounter: Payer: BLUE CROSS/BLUE SHIELD | Admitting: Physical Therapy

## 2014-12-10 ENCOUNTER — Encounter: Payer: BLUE CROSS/BLUE SHIELD | Admitting: Physical Therapy

## 2014-12-13 ENCOUNTER — Encounter: Payer: BLUE CROSS/BLUE SHIELD | Admitting: Physical Therapy

## 2014-12-13 ENCOUNTER — Ambulatory Visit: Payer: BLUE CROSS/BLUE SHIELD | Admitting: Physical Therapy

## 2014-12-14 ENCOUNTER — Ambulatory Visit: Payer: BLUE CROSS/BLUE SHIELD | Admitting: Physical Therapy

## 2014-12-14 DIAGNOSIS — Z9889 Other specified postprocedural states: Secondary | ICD-10-CM

## 2014-12-14 DIAGNOSIS — R531 Weakness: Secondary | ICD-10-CM

## 2014-12-14 DIAGNOSIS — M7582 Other shoulder lesions, left shoulder: Secondary | ICD-10-CM | POA: Diagnosis not present

## 2014-12-14 NOTE — Therapy (Signed)
[facility] [address] [place], [address] Phone: [phone]   Fax:  [phone]  Physical Therapy Treatment  Patient Details  Name: Mark Hernandez. MRN: [id number] Date of Birth: Oct 01, 1960 No Data Recorded  Encounter Date: 12/14/2014      PT End of Session - 12/14/14 1057    Visit Number 16   Number of Visits 17   Date for PT Re-Evaluation 12/30/14   PT Start Time 1005   PT Stop Time 1050   PT Time Calculation (min) 45 min   Activity Tolerance Patient tolerated treatment well;No increased pain   Behavior During Therapy [facility] for tasks assessed/performed      Past Medical History  Diagnosis Date  . Kidney stone   . Rotator cuff tear   . Anxiety     Past Surgical History  Procedure Laterality Date  . Lithotripsy    . Shoulder surgery Bilateral     rotator cuff repair  . Shoulder arthroscopy with open rotator cuff repair Left 09/23/2014    Procedure: SHOULDER ARTHROSCOPY , debridement, decompression, WITH  mini OPEN ROTATOR CUFF REPAIR;  Surgeon: Corky Mull, MD;  Location: [facility] ORS;  Service: Orthopedics;  Laterality: Left;    There were no vitals filed for this visit.  Visit Diagnosis:  Weakness  S/P rotator cuff repair      Subjective Assessment - 12/14/14 1009    Subjective Patient reports no issues with his shoulder since previous appointments. He is able to don and doff shirts with no problem.    Pertinent History S/P L rotator cuff repair 09/23/2014. Pt currently 4 weeks post op. Originally had L rotator cuff surgery 11/2013, had physical therapy but feels like his L shoulder never healed. Pt does not know of any MD protocols for his L shoulder. Next MD appointment is 10/08/2014.     Limitations House hold activities   Patient Stated Goals "Just to get better. Get my ROM back and my strength."   Currently in Pain? No/denies       TherEx  Standing bilateral ER with red t-band,  regressed to yellow t-band x 10 (complained of anterior shoulder pain)   Sidelying ERs with cuing for rotation vs retraction 3 sets x10 with 2# DB (appropriate ER activation with this)  Elevated push-ups to table top with cuing for no thoracic kyphosis x 8 repetitions for 3 sets No pain.   OMEGA rows 20#, 25# (2 sets)  x 8 repetitions (cuing for upright posture)   Scaption with 2# DB 3 sets x 8 repetitions (cuing and visual feedback for no shrugging, initially uncomfortable though this decreased with cuing for not shrugging).   OH press with 2# DB, shrugging noted (uncomfortable) changed to body weight in front of mirror, decreased pain. 2 sets x 8 with 2# DB, and 2 sets x 10 without weight.                          PT Education - 12/14/14 1056    Education provided Yes   Education Details Exercise technique and program going forward.    Person(s) Educated Patient   Methods Explanation;Demonstration;Verbal cues;Handout   Comprehension Verbalized understanding;Returned demonstration             PT Long Term Goals - 12/02/14 1052    PT LONG TERM GOAL #1   Title Patient will be independent with his HEP to  promote ROM and ability to raise his arm when appropriate.    Time 8   Period Weeks   Status Achieved   PT LONG TERM GOAL #2   Title Patient will improve L shoulder flexion, scaption, PROM to at least 145 degrees to promote ability to raise his L arm when appropriate.    Time 8   Period Weeks   Status Achieved   PT LONG TERM GOAL #3   Title Patient will improve L shoulder ER PROM to at least 70 degrees to promote ability to raise his arm when appropriate.    Time 8   Period Weeks   Status Not Met   PT LONG TERM GOAL #4   Title Patient will have L shoulder flexion and scaption AAROM to at least 145 degrees to promote ability to raise his arm when appropriate   Baseline 140 degrees flexion   Time 8   Period Weeks   Status Partially Met   PT LONG TERM  GOAL #5   Title Patient will have L shoulder flexion and scaption AROM to at least 90 degrees to promote ability to raise his arm.    Baseline 140 degrees of flexion, 125 degrees of scaption    Time 8   Period Weeks   Status Achieved               Plan - 12/14/14 1058    Clinical Impression Statement Patient continues to demonstrate strength and endurance progress with activities. He reports some mild increases in pain with OH press and noted to have compensatory anterior shoulder activity with external rotations with bands in standing, decreased with sidelying ER.    Pt will benefit from skilled therapeutic intervention in order to improve on the following deficits Hypomobility;Other (comment);Impaired flexibility;Decreased strength;Decreased mobility   Rehab Potential Good   Clinical Impairments Affecting Rehab Potential recurrent rotator cuff tear   PT Frequency 2x / week   PT Duration 6 weeks   PT Treatment/Interventions Manual techniques;Therapeutic exercise;Therapeutic activities;Patient/family education;Passive range of motion;Neuromuscular re-education   PT Next Visit Plan Standard rotator cuff post op protocol per referral   Consulted and Agree with Plan of Care Patient        Problem List Patient Active Problem List   Diagnosis Date Noted  . Work-related stress 09/08/2013  . Injury of left shoulder 05/05/2013  . Syncope and collapse 05/04/2013   Kerman Passey, PT, DPT    12/14/2014, 12:57 PM  [facility] [address], [place], [address] Phone: [phone]   Fax:  [phone]  Name: Mark Hernandez. MRN: [id number] Date of Birth: [birth year]

## 2014-12-14 NOTE — Patient Instructions (Addendum)
   All exercises provided were adapted from [email or web address][employer]. Patient was provided a written handout with pictures as described. Any additional cues were manually entered in to handout and copied in to this document.  SIDELYING EXTERNAL ROTATION  Lie on your side with your elbow bent and rested on your side. Next, draw up the your arm from the ground towards the ceiling.   Place a rolled up towel under your elbow if advised by your clinician.     Start with 2#  [employer], [employer] Machine Row  Set machine to appropriate weight.   Grabbing handles, pull back squeezing your shoulder blades together at end range.  Return to start position and repeat.  Machine style may vary depending on gym.    TABLE PUSH UPS   Perform a push up as shown while leaning on a table.

## 2014-12-16 ENCOUNTER — Encounter: Payer: BLUE CROSS/BLUE SHIELD | Admitting: Physical Therapy

## 2014-12-19 ENCOUNTER — Other Ambulatory Visit: Payer: Self-pay | Admitting: Family Medicine

## 2014-12-19 DIAGNOSIS — Z131 Encounter for screening for diabetes mellitus: Secondary | ICD-10-CM

## 2014-12-19 DIAGNOSIS — Z13220 Encounter for screening for lipoid disorders: Secondary | ICD-10-CM

## 2014-12-20 ENCOUNTER — Encounter: Payer: BLUE CROSS/BLUE SHIELD | Admitting: Physical Therapy

## 2014-12-20 ENCOUNTER — Ambulatory Visit: Payer: BLUE CROSS/BLUE SHIELD | Admitting: Physical Therapy

## 2014-12-20 DIAGNOSIS — Z9889 Other specified postprocedural states: Secondary | ICD-10-CM

## 2014-12-20 DIAGNOSIS — M7582 Other shoulder lesions, left shoulder: Secondary | ICD-10-CM | POA: Diagnosis not present

## 2014-12-20 DIAGNOSIS — R531 Weakness: Secondary | ICD-10-CM

## 2014-12-20 NOTE — Therapy (Signed)
[facility] [address] [place], [address] Phone: [phone]   Fax:  [phone]  Physical Therapy Treatment  Patient Details  Name: Mark Hernandez. MRN: [id number] Date of Birth: [birth year] No Data Recorded  Encounter Date: 12/20/2014      PT End of Session - 12/20/14 1051    Visit Number 17   Number of Visits 17   Date for PT Re-Evaluation 12/30/14   PT Start Time 1013   PT Stop Time 1052   PT Time Calculation (min) 39 min   Activity Tolerance Patient tolerated treatment well;No increased pain   Behavior During Therapy [facility] for tasks assessed/performed      Past Medical History  Diagnosis Date  . Kidney stone   . Rotator cuff tear   . Anxiety     Past Surgical History  Procedure Laterality Date  . Lithotripsy    . Shoulder surgery Bilateral     rotator cuff repair  . Shoulder arthroscopy with open rotator cuff repair Left 09/23/2014    Procedure: SHOULDER ARTHROSCOPY , debridement, decompression, WITH  mini OPEN ROTATOR CUFF REPAIR;  Surgeon: Corky Mull, MD;  Location: [facility] ORS;  Service: Orthopedics;  Laterality: Left;    There were no vitals filed for this visit.  Visit Diagnosis:  Weakness  S/P rotator cuff repair      Subjective Assessment - 12/20/14 1013    Subjective Pt reports no issues, he is feeling very weak but otherwise has been using arm for typical ADLs.   Pertinent History S/P L rotator cuff repair 09/23/2014. Pt currently 4 weeks post op. Originally had L rotator cuff surgery 11/2013, had physical therapy but feels like his L shoulder never healed. Pt does not know of any MD protocols for his L shoulder. Next MD appointment is 10/08/2014.     Limitations House hold activities   Patient Stated Goals "Just to get better. Get my ROM back and my strength."   Currently in Pain? No/denies   Pain Score 0-No pain       Objective: PROM for end range shoulder flexion, IR  with very light C-R. 9 min total. ROM improved to = R for flexion   2# wt shoulder extension, shoulder abduction, shoulder flexion all to 40 degr. 3x10.  ER isometric holds 3x5 with 10 sec. Holds, YTB.  UBE x2 min forward/back.  OMEGA 3x15 10 # scapular rows.   Shoulder shrugs 3x15 with 5 sec. Holds.  Pt fatigued but reported no pain following session. Will continue to progress strengthening at next session.                          PT Education - 12/20/14 1014    Education provided Yes   Education Details HEP   Person(s) Educated Patient   Methods Explanation;Demonstration   Comprehension Verbalized understanding;Returned demonstration             PT Long Term Goals - 12/02/14 1052    PT LONG TERM GOAL #1   Title Patient will be independent with his HEP to promote ROM and ability to raise his arm when appropriate.    Time 8   Period Weeks   Status Achieved   PT LONG TERM GOAL #2   Title Patient will improve L shoulder flexion, scaption, PROM to at least 145 degrees to promote ability to raise his L arm when appropriate.  Time 8   Period Weeks   Status Achieved   PT LONG TERM GOAL #3   Title Patient will improve L shoulder ER PROM to at least 70 degrees to promote ability to raise his arm when appropriate.    Time 8   Period Weeks   Status Not Met   PT LONG TERM GOAL #4   Title Patient will have L shoulder flexion and scaption AAROM to at least 145 degrees to promote ability to raise his arm when appropriate   Baseline 140 degrees flexion   Time 8   Period Weeks   Status Partially Met   PT LONG TERM GOAL #5   Title Patient will have L shoulder flexion and scaption AROM to at least 90 degrees to promote ability to raise his arm.    Baseline 140 degrees of flexion, 125 degrees of scaption    Time 8   Period Weeks   Status Achieved               Plan - 12/20/14 1052    Clinical Impression Statement Pt continues to make progress  with strength and control. ROM is now WNL for shoulder flexion, ER. Somewhat limited IR but pt thinks this is due to stiffness from sleeping on it wrong. middle delt. demonstrates significant weakness. Pt is continuing to make progress with ROM, strength.    Pt will benefit from skilled therapeutic intervention in order to improve on the following deficits Hypomobility;Other (comment);Impaired flexibility;Decreased strength;Decreased mobility   Rehab Potential Good   Clinical Impairments Affecting Rehab Potential recurrent rotator cuff tear   PT Frequency 2x / week   PT Duration 6 weeks   PT Treatment/Interventions Manual techniques;Therapeutic exercise;Therapeutic activities;Patient/family education;Passive range of motion;Neuromuscular re-education   PT Next Visit Plan Standard rotator cuff post op protocol per referral   Consulted and Agree with Plan of Care Patient        Problem List Patient Active Problem List   Diagnosis Date Noted  . Work-related stress 09/08/2013  . Injury of left shoulder 05/05/2013  . Syncope and collapse 05/04/2013    Fisher,Benjamin PT 12/20/2014, 11:33 AM  [facility] [address] [place], [address] Phone: [phone]   Fax:  [phone]  Name: Mark Hernandez. MRN: [id number] Date of Birth: 16-Nov-1960

## 2014-12-27 ENCOUNTER — Encounter: Payer: BLUE CROSS/BLUE SHIELD | Admitting: Physical Therapy

## 2014-12-27 ENCOUNTER — Ambulatory Visit: Payer: BLUE CROSS/BLUE SHIELD | Admitting: Physical Therapy

## 2014-12-27 DIAGNOSIS — R531 Weakness: Secondary | ICD-10-CM

## 2014-12-27 DIAGNOSIS — M7582 Other shoulder lesions, left shoulder: Secondary | ICD-10-CM | POA: Diagnosis not present

## 2014-12-27 NOTE — Therapy (Signed)
[facility] [address] [place], [place], [id number] Phone: [phone]   Fax:  [phone]  Physical Therapy Treatment  Patient Details  Name: Mark Connersony G Vogl Jr. MRN: [id number] Date of Birth: 12/05/1960 No Data Recorded  Encounter Date: 12/27/2014      PT End of Session - 12/27/14 0957    Visit Number 18   Number of Visits 17   Date for PT Re-Evaluation 12/30/14   PT Start Time 0915   PT Stop Time 0940   PT Time Calculation (min) 25 min   Activity Tolerance Patient tolerated treatment well;No increased pain   Behavior [facility] Therapy [facility] for tasks assessed/performed      Past Medical History  Diagnosis Date  . Kidney stone   . Rotator cuff tear   . Anxiety     Past Surgical History  Procedure Laterality Date  . Lithotripsy    . Shoulder surgery Bilateral     rotator cuff repair  . Shoulder arthroscopy with open rotator cuff repair Left 09/23/2014    Procedure: SHOULDER ARTHROSCOPY , debridement, decompression, WITH  mini OPEN ROTATOR CUFF REPAIR;  Surgeon: Christena FlakeJohn J Poggi, MD;  Location: [facility] ORS;  Service: Orthopedics;  Laterality: Left;    There were no vitals filed for this visit.  Visit Diagnosis:  Weakness - Plan: PT plan of care cert/re-cert      Subjective Assessment - 12/27/14 0956    Subjective Pt continues to report weakness, no other issues, is confident he can continue to strengthen on his own.   Pertinent History S/P L rotator cuff repair 09/23/2014. Pt currently 4 weeks post op. Originally had L rotator cuff surgery 11/2013, had physical therapy but feels like his L shoulder never healed. Pt does not know of any MD protocols for his L shoulder. Next MD appointment is 10/08/2014.     Limitations House hold activities   Patient Stated Goals "Just to get better. Get my ROM back and my strength."   Currently in Pain? No/denies         Objective: Reviewed HEP and corrected performance for  SL external rotation.  Performed OMEGA scap row 20# 3x15.  OMEGA press 20, 25, 30, 35# 15, 10, 6, 4 and reversed.  Isometric holds with RTB for ER, 5x10 with 10 sec. Holds.  QuickDASH 15. Pt verbalized confidence in continuing strengthening on his own.                        PT Education - 12/27/14 0957    Education provided Yes   Education Details progressing HEP on his own   Person(s) Educated Patient   Methods Explanation   Comprehension Verbalized understanding             PT Long Term Goals - 12/27/14 0958    PT LONG TERM GOAL #1   Title Patient will be independent with his HEP to promote ROM and ability to raise his arm when appropriate.    Time 8   Period Weeks   Status Achieved   PT LONG TERM GOAL #2   Title Patient will improve L shoulder flexion, scaption, PROM to at least 145 degrees to promote ability to raise his L arm when appropriate.    Time 8   Period Weeks   Status Achieved   PT LONG TERM GOAL #3   Title Patient will improve L shoulder ER PROM to at least 70  degrees to promote ability to raise his arm when appropriate.    Time 8   Period Weeks   Status Achieved   PT LONG TERM GOAL #4   Title Patient will have L shoulder flexion and scaption AAROM to at least 145 degrees to promote ability to raise his arm when appropriate   Baseline 140 degrees flexion   Time 8   Period Weeks   Status Achieved   PT LONG TERM GOAL #5   Title Patient will have L shoulder flexion and scaption AROM to at least 90 degrees to promote ability to raise his arm.    Baseline 140 degrees of flexion, 125 degrees of scaption    Time 8   Period Weeks   Status Achieved       Plan: Pt is appropriate for d/c at this time as he is I with his HEP and ROM is WNL.        Problem List Patient Active Problem List   Diagnosis Date Noted  . Work-related stress 09/08/2013  . Injury of left shoulder 05/05/2013  . Syncope and collapse 05/04/2013     Natividad Schlosser PT 12/27/2014, 10:03 AM  [facility] [address], [place], [address] Phone: [phone]   Fax:  [phone]  Name: Mark Hernandez. MRN: [id number] Date of Birth: 1960-02-12

## 2014-12-28 ENCOUNTER — Other Ambulatory Visit (INDEPENDENT_AMBULATORY_CARE_PROVIDER_SITE_OTHER): Payer: BLUE CROSS/BLUE SHIELD

## 2014-12-28 DIAGNOSIS — Z13220 Encounter for screening for lipoid disorders: Secondary | ICD-10-CM

## 2014-12-28 DIAGNOSIS — Z131 Encounter for screening for diabetes mellitus: Secondary | ICD-10-CM | POA: Diagnosis not present

## 2014-12-28 LAB — LIPID PANEL
Cholesterol: 201 mg/dL — ABNORMAL HIGH (ref 0–200)
HDL: 45.5 mg/dL (ref >39.00)
LDL Cholesterol: 135 mg/dL — ABNORMAL HIGH (ref 0–99)
NONHDL: 155.27
Total CHOL/HDL Ratio: 4
Triglycerides: 100 mg/dL (ref 0.0–149.0)
VLDL: 20 mg/dL (ref 0.0–40.0)

## 2014-12-28 LAB — GLUCOSE, RANDOM: GLUCOSE: 97 mg/dL (ref 70–99)

## 2014-12-29 ENCOUNTER — Encounter: Payer: BLUE CROSS/BLUE SHIELD | Admitting: Physical Therapy

## 2014-12-31 ENCOUNTER — Ambulatory Visit (INDEPENDENT_AMBULATORY_CARE_PROVIDER_SITE_OTHER): Payer: BLUE CROSS/BLUE SHIELD | Admitting: Family Medicine

## 2014-12-31 ENCOUNTER — Encounter: Payer: Self-pay | Admitting: Family Medicine

## 2014-12-31 ENCOUNTER — Encounter: Payer: Self-pay | Admitting: *Deleted

## 2014-12-31 VITALS — BP 128/78 | HR 71 | Temp 98.1°F | Ht 68.5 in | Wt 226.5 lb

## 2014-12-31 DIAGNOSIS — Z566 Other physical and mental strain related to work: Secondary | ICD-10-CM

## 2014-12-31 DIAGNOSIS — Z0000 Encounter for general adult medical examination without abnormal findings: Secondary | ICD-10-CM | POA: Diagnosis not present

## 2014-12-31 DIAGNOSIS — Z119 Encounter for screening for infectious and parasitic diseases, unspecified: Secondary | ICD-10-CM

## 2014-12-31 DIAGNOSIS — Z7189 Other specified counseling: Secondary | ICD-10-CM | POA: Insufficient documentation

## 2014-12-31 DIAGNOSIS — K429 Umbilical hernia without obstruction or gangrene: Secondary | ICD-10-CM

## 2014-12-31 MED ORDER — ESCITALOPRAM OXALATE 10 MG PO TABS: 10.0000 mg | ORAL_TABLET | Freq: Every day | ORAL | Status: DC

## 2014-12-31 NOTE — Assessment & Plan Note (Signed)
Controlled, continue SSRI.  Doing well.

## 2014-12-31 NOTE — Assessment & Plan Note (Addendum)
Tetanus done at work, 2011 Flu done at work 10/2014 PNA and shingles not due  PSA screening- done by uro, with f/u pending for 03/2015  Colonoscopy done prev, 2014. Was told 10 year f/u. [facility]  Living will d/w pt. Wife designated if patient were incapacitated.  Diet and exercise d/w pt. Encouraged both, he is going to get hernia repaired then work on exercise.  Pt opts in for HCV and HIV screening. D/w pt re: routine screening.  Refer re: umbilical hernia.  D/w pt.

## 2014-12-31 NOTE — Progress Notes (Signed)
Pre visit review using our clinic review tool, if applicable. No additional management support is needed unless otherwise documented below in the visit note.  CPE- See plan.  Routine anticipatory guidance given to patient.  See health maintenance. Tetanus done at work, in the last 5 years.   Flu done at work.   PNA and shingles not due PSA screening- done by uro, with f/u pending for 03/2015 Colonoscopy done prev, 2 years ago.   Was told 10 year f/u.  [facility]   Living will d/w pt.  Wife designated if patient were incapacitated.  Diet and exercise d/w pt.  Encouraged both, he is going to get hernia repaired then work on exercise.  Pt opts in for HCV and HIV screening.  D/w pt re: routine screening.    Anxiety controlled with SSRI, sleeping better with med. No ADE.    Umbilical hernia.  Present for years.  Needed referral.    PMH and SH reviewed  Meds, vitals, and allergies reviewed.   ROS: See HPI.  Otherwise negative.    GEN: nad, alert and oriented HEENT: mucous membranes moist NECK: supple w/o LA CV: rrr. PULM: ctab, no inc wob ABD: soft, +bs EXT: no edema SKIN: no acute rash Soft umbilical hernia.

## 2014-12-31 NOTE — Patient Instructions (Addendum)
Shirlee LimerickMarion will call about your referral. Take care.  Glad to see you.  Gradually get back to exercising after the hernia repair.   Don't change your med.

## 2015-01-30 DIAGNOSIS — R569 Unspecified convulsions: Secondary | ICD-10-CM

## 2015-01-30 HISTORY — DX: Unspecified convulsions: R56.9

## 2015-02-08 ENCOUNTER — Ambulatory Visit: Payer: Self-pay | Admitting: General Surgery

## 2015-02-08 ENCOUNTER — Ambulatory Visit (INDEPENDENT_AMBULATORY_CARE_PROVIDER_SITE_OTHER): Payer: BLUE CROSS/BLUE SHIELD | Admitting: General Surgery

## 2015-02-08 ENCOUNTER — Encounter: Payer: Self-pay | Admitting: General Surgery

## 2015-02-08 VITALS — BP 148/88 | HR 70 | Resp 12 | Ht 69.0 in | Wt 226.0 lb

## 2015-02-08 DIAGNOSIS — K429 Umbilical hernia without obstruction or gangrene: Secondary | ICD-10-CM | POA: Diagnosis not present

## 2015-02-08 DIAGNOSIS — K4090 Unilateral inguinal hernia, without obstruction or gangrene, not specified as recurrent: Secondary | ICD-10-CM

## 2015-02-08 NOTE — Patient Instructions (Addendum)
Hernia, Adult °A hernia is the bulging of an organ or tissue through a weak spot in the muscles of the abdomen (abdominal wall). Hernias develop most often near the navel or groin. °There are many kinds of hernias. Common kinds include: °· Femoral hernia. This kind of hernia develops under the groin in the upper thigh area. °· Inguinal hernia. This kind of hernia develops in the groin or scrotum. °· Umbilical hernia. This kind of hernia develops near the navel. °· Hiatal hernia. This kind of hernia causes part of the stomach to be pushed up into the chest. °· Incisional hernia. This kind of hernia bulges through a scar from an abdominal surgery. °CAUSES °This condition may be caused by: °· Heavy lifting. °· Coughing over a long period of time. °· Straining to have a bowel movement. °· An incision made during an abdominal surgery. °· A birth defect (congenital defect). °· Excess weight or obesity. °· Smoking. °· Poor nutrition. °· Cystic fibrosis. °· Excess fluid in the abdomen. °· Undescended testicles. °SYMPTOMS °Symptoms of a hernia include: °· A lump on the abdomen. This is the first sign of a hernia. The lump may become more obvious with standing, straining, or coughing. It may get bigger over time if it is not treated or if the condition causing it is not treated. °· Pain. A hernia is usually painless, but it may become painful over time if treatment is delayed. The pain is usually dull and may get worse with standing or lifting heavy objects. °Sometimes a hernia gets tightly squeezed in the weak spot (strangulated) or stuck there (incarcerated) and causes additional symptoms. These symptoms may include: °· Vomiting. °· Nausea. °· Constipation. °· Irritability. °DIAGNOSIS °A hernia may be diagnosed with: °· A physical exam. During the exam your health care provider may ask you to cough or to make a specific movement, because a hernia is usually more visible when you move. °· Imaging tests. These can  include: °¨ X-rays. °¨ Ultrasound. °¨ CT scan. °TREATMENT °A hernia that is small and painless may not need to be treated. A hernia that is large or painful may be treated with surgery. Inguinal hernias may be treated with surgery to prevent incarceration or strangulation. Strangulated hernias are always treated with surgery, because lack of blood to the trapped organ or tissue can cause it to die. °Surgery to treat a hernia involves pushing the bulge back into place and repairing the weak part of the abdomen. °HOME CARE INSTRUCTIONS °· Avoid straining. °· Do not lift anything heavier than 10 lb (4.5 kg). °· Lift with your leg muscles, not your back muscles. This helps avoid strain. °· When coughing, try to cough gently. °· Prevent constipation. Constipation leads to straining with bowel movements, which can make a hernia worse or cause a hernia repair to break down. You can prevent constipation by: °· Eating a high-fiber diet that includes plenty of fruits and vegetables. °· Drinking enough fluids to keep your urine clear or pale yellow. Aim to drink 6-8 glasses of water per day. °· Using a stool softener as directed by your health care provider. °· Lose weight, if you are overweight. °· Do not use any tobacco products, including cigarettes, chewing tobacco, or electronic cigarettes. If you need help quitting, ask your health care provider. °· Keep all follow-up visits as directed by your health care provider. This is important. Your health care provider may need to monitor your condition. °SEEK MEDICAL CARE IF: °· You have   swelling, redness, and pain in the affected area. °· Your bowel habits change. °SEEK IMMEDIATE MEDICAL CARE IF: °· You have a fever. °· You have abdominal pain that is getting worse. °· You feel nauseous or you vomit. °· You cannot push the hernia back in place by gently pressing on it while you are lying down. °· The hernia: °¨ Changes in shape or size. °¨ Is stuck outside the  abdomen. °¨ Becomes discolored. °¨ Feels hard or tender. °  °This information is not intended to replace advice given to you by your health care provider. Make sure you discuss any questions you have with your health care provider. °  °Document Released: 01/15/2005 Document Revised: 02/05/2014 Document Reviewed: 11/25/2013 °Elsevier Interactive Patient Education ©2016 [employer] ° °Inguinal Hernia, Adult °Muscles help keep everything in the body in its proper place. But if a weak spot in the muscles develops, something can poke through. That is called a hernia. When this happens in the lower part of the belly (abdomen), it is called an inguinal hernia. (It takes its name from a part of the body in this region called the inguinal canal.) A weak spot in the wall of muscles lets some fat or part of the small intestine bulge through. An inguinal hernia can develop at any age. Men get them more often than women. °CAUSES  °In adults, an inguinal hernia develops over time. °· It can be triggered by: °¨ Suddenly straining the muscles of the lower abdomen. °¨ Lifting heavy objects. °¨ Straining to have a bowel movement. Difficult bowel movements (constipation) can lead to this. °¨ Constant coughing. This may be caused by smoking or lung disease. °¨ Being overweight. °¨ Being pregnant. °¨ Working at a job that requires long periods of standing or heavy lifting. °¨ Having had an inguinal hernia before. °One type can be an emergency situation. It is called a strangulated inguinal hernia. It develops if part of the small intestine slips through the weak spot and cannot get back into the abdomen. The blood supply can be cut off. If that happens, part of the intestine may die. This situation requires emergency surgery. °SYMPTOMS  °Often, a small inguinal hernia has no symptoms. It is found when a healthcare provider does a physical exam. Larger hernias usually have symptoms.  °· In adults, symptoms may include: °¨ A lump in the  groin. This is easier to see when the person is standing. It might disappear when lying down. °¨ In men, a lump in the scrotum. °¨ Pain or burning in the groin. This occurs especially when lifting, straining or coughing. °¨ A dull ache or feeling of pressure in the groin. °· Signs of a strangulated hernia can include: °¨ A bulge in the groin that becomes very painful and tender to the touch. °¨ A bulge that turns red or purple. °¨ Fever, nausea and vomiting. °¨ Inability to have a bowel movement or to pass gas. °DIAGNOSIS  °To decide if you have an inguinal hernia, a healthcare provider will probably do a physical examination. °· This will include asking questions about any symptoms you have noticed. °· The healthcare provider might feel the groin area and ask you to cough. If an inguinal hernia is felt, the healthcare provider may try to slide it back into the abdomen. °· Usually no other tests are needed. °TREATMENT  °Treatments can vary. The size of the hernia makes a difference. Options include: °· Watchful waiting. This is often suggested if   the hernia is small and you have had no symptoms. °¨ No medical procedure will be done unless symptoms develop. °¨ You will need to watch closely for symptoms. If any occur, contact your healthcare provider right away. °· Surgery. This is used if the hernia is larger or you have symptoms. °¨ Open surgery. This is usually an outpatient procedure (you will not stay overnight in a hospital). An cut (incision) is made through the skin in the groin. The hernia is put back inside the abdomen. The weak area in the muscles is then repaired by herniorrhaphy or hernioplasty. Herniorrhaphy: in this type of surgery, the weak muscles are sewn back together. Hernioplasty: a patch or mesh is used to close the weak area in the abdominal wall. °¨ Laparoscopy. In this procedure, a surgeon makes small incisions. A thin tube with a tiny video camera (called a laparoscope) is put into the  abdomen. The surgeon repairs the hernia with mesh by looking with the video camera and using two long instruments. °HOME CARE INSTRUCTIONS  °· After surgery to repair an inguinal hernia: °¨ You will need to take pain medicine prescribed by your healthcare provider. Follow all directions carefully. °¨ You will need to take care of the wound from the incision. °¨ Your activity will be restricted for awhile. This will probably include no heavy lifting for several weeks. You also should not do anything too active for a few weeks. When you can return to work will depend on the type of job that you have. °· During "watchful waiting" periods, you should: °¨ Maintain a healthy weight. °¨ Eat a diet high in fiber (fruits, vegetables and whole grains). °¨ Drink plenty of fluids to avoid constipation. This means drinking enough water and other liquids to keep your urine clear or pale yellow. °¨ Do not lift heavy objects. °¨ Do not stand for long periods of time. °¨ Quit smoking. This should keep you from developing a frequent cough. °SEEK MEDICAL CARE IF:  °· A bulge develops in your groin area. °· You feel pain, a burning sensation or pressure in the groin. This might be worse if you are lifting or straining. °· You develop a fever of more than 100.5° F (38.1° C). °SEEK IMMEDIATE MEDICAL CARE IF:  °· Pain in the groin increases suddenly. °· A bulge in the groin gets bigger suddenly and does not go down. °· For men, there is sudden pain in the scrotum. Or, the size of the scrotum increases. °· A bulge in the groin area becomes red or purple and is painful to touch. °· You have nausea or vomiting that does not go away. °· You feel your heart beating much faster than normal. °· You cannot have a bowel movement or pass gas. °· You develop a fever of more than 102.0° F (38.9° C). °  °This information is not intended to replace advice given to you by your health care provider. Make sure you discuss any questions you have with your  health care provider. °  °Document Released: 06/03/2008 Document Revised: 04/09/2011 Document Reviewed: 07/19/2014 °Elsevier Interactive Patient Education ©2016 Elsevier Inc. ° °

## 2015-02-08 NOTE — Progress Notes (Addendum)
Patient ID: Mark Connersony G Henderson Hernandez., male   DOB: 12-27-1960, 55 y.o.   MRN: [id number]  Chief Complaint  Patient presents with  . Hernia    HPI Mark Hernandez. is a 55 y.o. male.  Here today for evaluation of an umbilical hernia. He states he noticed it about 2 years ago. He does not remember at particular incident that trigger the bulge.  Denies pain. Bowels move daily. 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 HPI  Past Medical History  Diagnosis Date  . Kidney stone   . Rotator cuff tear   . Anxiety   . Arthritis     BOTH HANDS  . Hx MRSA infection 2009  . HLD (hyperlipidemia)   . Over weight   . Nocturia   . BPH (benign prostatic hypertrophy)   . Elevated PSA     Past Surgical History  Procedure Laterality Date  . Lithotripsy      x 2  . Shoulder surgery Bilateral     rotator cuff repair  . Shoulder arthroscopy with open rotator cuff repair Left 09/23/2014    Procedure: SHOULDER ARTHROSCOPY , debridement, decompression, WITH  mini OPEN ROTATOR CUFF REPAIR;  Surgeon: Christena FlakeJohn J Poggi, MD;  Location: [facility] ORS;  Service: Orthopedics;  Laterality: Left;  . Colonoscopy  2014?  Mark Hernandez. Umbilical hernia repair N/A 02/17/2015    Procedure: LAPAROSCOPIC UMBILICAL HERNIA;  Surgeon: Kieth BrightlySeeplaputhur G Alayne Estrella, MD;  Location: [facility] ORS;  Service: General;  Laterality: N/A;  . Inguinal hernia repair Left 02/17/2015    Procedure: LAPAROSCOPIC INGUINAL HERNIA;  Surgeon: Kieth BrightlySeeplaputhur G Hadlea Furuya, MD;  Location: [facility] ORS;  Service: General;  Laterality: Left;    Family History  Problem Relation Age of Onset  . Prostate cancer Father   . Colon cancer Paternal Grandmother   . Kidney disease Paternal Uncle     Social History Social History  Substance Use Topics  . Smoking status: Never Smoker   . Smokeless tobacco: Former NeurosurgeonUser    Types: Chew    Quit date: 05/07/2004  . Alcohol Use: Yes     Comment: occ    Allergies   Allergen Reactions  . Penicillins Other (See Comments)    REACTION: as child    Current Outpatient Prescriptions  Medication Sig Dispense Refill  . escitalopram (LEXAPRO) 10 MG tablet Take 1 tablet (10 mg total) by mouth daily. (Patient taking differently: Take 10 mg by mouth every morning. ) 90 tablet 3  . ibuprofen (ADVIL,MOTRIN) 200 MG tablet Take 400 mg by mouth daily as needed.    Mark Hernandez. oxyCODONE-acetaminophen (ROXICET) 5-325 MG tablet Take 1 tablet by mouth every 4 (four) hours as needed. 30 tablet 0   No current facility-administered medications for this visit.    Review of Systems Review of Systems  Constitutional: Negative.   Respiratory: Negative.   Cardiovascular: Negative.     Blood pressure 148/88, pulse 70, resp. rate 12, height 5\' 9"  (1.753 m), weight 226 lb (102.513 kg).  Physical Exam Physical Exam  Constitutional: He is oriented to person, place, and time. He appears well-developed and well-nourished.  HENT:  Mouth/Throat: Oropharynx is clear and moist.  Eyes: Conjunctivae are normal. No scleral icterus.  Neck: Neck supple.  Cardiovascular: Normal rate, regular rhythm and normal heart sounds.   Pulmonary/Chest: Effort normal and breath sounds normal.  Abdominal: Soft. Normal appearance. There is no hepatomegaly. There  is no tenderness. A hernia is present. Hernia confirmed positive in the right inguinal area. Hernia confirmed negative in the left inguinal area.  3 cm umbilical hernia partial reducible. Small right inguinal hernia present.  Lymphadenopathy:    He has no cervical adenopathy.  Neurological: He is alert and oriented to person, place, and time.  Skin: Skin is warm and dry.  Psychiatric: His behavior is normal.    Data Reviewed Progress notes.  Assessment    Umbilical and right inguinal hernia. Questionable hernia in left groin    Plan     Hernia precautions and incarceration were discussed with the patient. If they develop symptoms of an  incarcerated hernia, they were encouraged to seek prompt medical attention.  I have recommended repair of the hernia using mesh on an outpatient basis in the near future. The risk of infection was reviewed. The role of prosthetic mesh to minimize the risk of recurrence was reviewed.        PCP:  Crawford Givens This information has been scribed by Dorathy Daft RNBC.  Jedrick Hutcherson G 03/15/2015, 1:03 PM

## 2015-02-11 ENCOUNTER — Encounter: Payer: Self-pay | Admitting: *Deleted

## 2015-02-11 ENCOUNTER — Other Ambulatory Visit: Payer: BLUE CROSS/BLUE SHIELD

## 2015-02-11 NOTE — Patient Instructions (Signed)
  Your procedure is scheduled on: 02-17-15 (THURSDAY) Report to [facility] SAME [facility] SURGERY 2ND FLOOR To find out your arrival time please call [phone] between 1PM - 3PM on 02-16-15 [facility](WEDNESDAY)  Remember: Instructions that are not followed completely may result in serious medical risk, up to and including death, or upon the discretion of your surgeon and anesthesiologist your surgery may need to be rescheduled.    _X__ 1. Do not eat food or drink liquids after midnight. No gum chewing or hard candies.     _X__ 2. No Alcohol for 24 hours before or after surgery.   ____ 3. Bring all medications with you on the day of surgery if instructed.    _X__ 4. Notify your doctor if there is any change in your medical condition     (cold, fever, infections).     Do not wear jewelry, make-up, hairpins, clips or nail polish.  Do not wear lotions, powders, or perfumes. You may wear deodorant.  Do not shave 48 hours prior to surgery. Men may shave face and neck.  Do not bring valuables to the hospital.    [facility] belongings or valuables.               Contacts, dentures or bridgework may not be worn into surgery.  Leave your suitcase in the car. After surgery it may be brought to your room.  For patients admitted to the hospital, discharge time is determined by your treatment team.   Patients discharged the day of surgery will not be allowed to drive home.   Please read over the following fact sheets that you were given:      _X__ Take these medicines the morning of surgery with A SIP OF WATER:    1. LEXAPRO  2.   3.   4.  5.  6.  ____ Fleet Enema (as directed)   _X___ Use CHG Soap as directed  ____ Use inhalers on the day of surgery  ____ Stop metformin 2 days prior to surgery    ____ Take 1/2 of usual insulin dose the night before surgery and none on the morning of surgery.   ____ Stop Coumadin/Plavix/aspirin-N/A  _X___ Stop  Anti-inflammatories-STOP IBUPROFEN-NO NSAIDS OR ASPIRIN PRODUCTS-TYLENOL OK TO TAKE   ____ Stop supplements until after surgery.    ____ Bring C-Pap to the hospital.

## 2015-02-16 ENCOUNTER — Encounter
Admission: RE | Admit: 2015-02-16 | Discharge: 2015-02-16 | Disposition: A | Discharge status: Home / Self Care | Payer: BLUE CROSS/BLUE SHIELD | Source: Ambulatory Visit | Attending: General Surgery | Admitting: General Surgery

## 2015-02-16 DIAGNOSIS — Z79899 Other long term (current) drug therapy: Secondary | ICD-10-CM | POA: Diagnosis not present

## 2015-02-16 DIAGNOSIS — Z800 Family history of malignant neoplasm of digestive organs: Secondary | ICD-10-CM | POA: Diagnosis not present

## 2015-02-16 DIAGNOSIS — Z880 Allergy status to penicillin: Secondary | ICD-10-CM | POA: Diagnosis not present

## 2015-02-16 DIAGNOSIS — Z87891 Personal history of nicotine dependence: Secondary | ICD-10-CM | POA: Diagnosis not present

## 2015-02-16 DIAGNOSIS — Z87442 Personal history of urinary calculi: Secondary | ICD-10-CM | POA: Diagnosis not present

## 2015-02-16 DIAGNOSIS — K429 Umbilical hernia without obstruction or gangrene: Secondary | ICD-10-CM | POA: Diagnosis not present

## 2015-02-16 DIAGNOSIS — Z8042 Family history of malignant neoplasm of prostate: Secondary | ICD-10-CM | POA: Diagnosis not present

## 2015-02-16 DIAGNOSIS — K4090 Unilateral inguinal hernia, without obstruction or gangrene, not specified as recurrent: Secondary | ICD-10-CM | POA: Diagnosis present

## 2015-02-16 DIAGNOSIS — F419 Anxiety disorder, unspecified: Secondary | ICD-10-CM | POA: Diagnosis not present

## 2015-02-16 LAB — SURGICAL PCR SCREEN
MRSA, PCR: NEGATIVE
STAPHYLOCOCCUS AUREUS: NEGATIVE

## 2015-02-17 ENCOUNTER — Encounter: Payer: Self-pay | Admitting: *Deleted

## 2015-02-17 ENCOUNTER — Ambulatory Visit
Admission: RE | Admit: 2015-02-17 | Discharge: 2015-02-17 | Disposition: A | Discharge status: Home / Self Care | Payer: BLUE CROSS/BLUE SHIELD | Source: Ambulatory Visit | Attending: General Surgery | Admitting: General Surgery

## 2015-02-17 ENCOUNTER — Ambulatory Visit: Payer: BLUE CROSS/BLUE SHIELD | Admitting: Anesthesiology

## 2015-02-17 ENCOUNTER — Encounter
Admission: RE | Disposition: A | Discharge status: Home / Self Care | Payer: Self-pay | Source: Ambulatory Visit | Attending: General Surgery

## 2015-02-17 DIAGNOSIS — Z87442 Personal history of urinary calculi: Secondary | ICD-10-CM | POA: Insufficient documentation

## 2015-02-17 DIAGNOSIS — Z800 Family history of malignant neoplasm of digestive organs: Secondary | ICD-10-CM | POA: Insufficient documentation

## 2015-02-17 DIAGNOSIS — Z79899 Other long term (current) drug therapy: Secondary | ICD-10-CM | POA: Insufficient documentation

## 2015-02-17 DIAGNOSIS — Z8042 Family history of malignant neoplasm of prostate: Secondary | ICD-10-CM | POA: Insufficient documentation

## 2015-02-17 DIAGNOSIS — K4090 Unilateral inguinal hernia, without obstruction or gangrene, not specified as recurrent: Secondary | ICD-10-CM | POA: Insufficient documentation

## 2015-02-17 DIAGNOSIS — K429 Umbilical hernia without obstruction or gangrene: Secondary | ICD-10-CM | POA: Insufficient documentation

## 2015-02-17 DIAGNOSIS — F419 Anxiety disorder, unspecified: Secondary | ICD-10-CM | POA: Insufficient documentation

## 2015-02-17 DIAGNOSIS — Z880 Allergy status to penicillin: Secondary | ICD-10-CM | POA: Insufficient documentation

## 2015-02-17 DIAGNOSIS — Z87891 Personal history of nicotine dependence: Secondary | ICD-10-CM | POA: Insufficient documentation

## 2015-02-17 HISTORY — DX: Unspecified osteoarthritis, unspecified site: M19.90

## 2015-02-17 HISTORY — PX: UMBILICAL HERNIA REPAIR: SHX196

## 2015-02-17 HISTORY — DX: Personal history of Methicillin resistant Staphylococcus aureus infection: Z86.14

## 2015-02-17 HISTORY — PX: INGUINAL HERNIA REPAIR: SHX194

## 2015-02-17 SURGERY — REPAIR, HERNIA, UMBILICAL, LAPAROSCOPIC
Anesthesia: General | Wound class: Clean

## 2015-02-17 MED ORDER — ONDANSETRON HCL 4 MG/2ML IJ SOLN
INTRAMUSCULAR | Status: DC | PRN
  Administered 2015-02-17: 4 mg via INTRAVENOUS

## 2015-02-17 MED ORDER — FAMOTIDINE 20 MG PO TABS
ORAL_TABLET | ORAL | Status: AC
  Administered 2015-02-17: 20 mg via ORAL
  Filled 2015-02-17: qty 1

## 2015-02-17 MED ORDER — PROPOFOL 10 MG/ML IV BOLUS
INTRAVENOUS | Status: DC | PRN
  Administered 2015-02-17: 200 mg via INTRAVENOUS

## 2015-02-17 MED ORDER — KETOROLAC TROMETHAMINE 30 MG/ML IJ SOLN
INTRAMUSCULAR | Status: DC | PRN
  Administered 2015-02-17: 30 mg via INTRAVENOUS

## 2015-02-17 MED ORDER — DEXAMETHASONE SODIUM PHOSPHATE 10 MG/ML IJ SOLN
INTRAMUSCULAR | Status: DC | PRN
  Administered 2015-02-17: 10 mg via INTRAVENOUS

## 2015-02-17 MED ORDER — FENTANYL CITRATE (PF) 100 MCG/2ML IJ SOLN: 25.0000 ug | INTRAMUSCULAR | Status: DC | PRN

## 2015-02-17 MED ORDER — LACTATED RINGERS IV SOLN
INTRAVENOUS | Status: DC
  Administered 2015-02-17 (×2): via INTRAVENOUS

## 2015-02-17 MED ORDER — NEOSTIGMINE METHYLSULFATE 10 MG/10ML IV SOLN
INTRAVENOUS | Status: DC | PRN
  Administered 2015-02-17: 2.5 mg via INTRAVENOUS

## 2015-02-17 MED ORDER — FAMOTIDINE 20 MG PO TABS
20.0000 mg | ORAL_TABLET | Freq: Once | ORAL | Status: AC
  Administered 2015-02-17: 20 mg via ORAL

## 2015-02-17 MED ORDER — OXYCODONE HCL 5 MG/5ML PO SOLN: 5.0000 mg | Freq: Once | ORAL | Status: DC | PRN

## 2015-02-17 MED ORDER — ROCURONIUM BROMIDE 100 MG/10ML IV SOLN
INTRAVENOUS | Status: DC | PRN
  Administered 2015-02-17 (×2): 10 mg via INTRAVENOUS
  Administered 2015-02-17: 30 mg via INTRAVENOUS

## 2015-02-17 MED ORDER — CHLORHEXIDINE GLUCONATE 4 % EX LIQD: 1.0000 "application " | Freq: Once | CUTANEOUS | Status: DC

## 2015-02-17 MED ORDER — CEFAZOLIN SODIUM-DEXTROSE 2-3 GM-% IV SOLR
2.0000 g | INTRAVENOUS | Status: AC
  Administered 2015-02-17: 2 g via INTRAVENOUS

## 2015-02-17 MED ORDER — OXYCODONE-ACETAMINOPHEN 5-325 MG PO TABS
ORAL_TABLET | ORAL | Status: AC
  Administered 2015-02-17: 1
  Filled 2015-02-17: qty 1

## 2015-02-17 MED ORDER — GLYCOPYRROLATE 0.2 MG/ML IJ SOLN
INTRAMUSCULAR | Status: DC | PRN
  Administered 2015-02-17: 0.4 mg via INTRAVENOUS

## 2015-02-17 MED ORDER — MIDAZOLAM HCL 2 MG/2ML IJ SOLN
INTRAMUSCULAR | Status: DC | PRN
  Administered 2015-02-17: 2 mg via INTRAVENOUS

## 2015-02-17 MED ORDER — CEFAZOLIN SODIUM-DEXTROSE 2-3 GM-% IV SOLR
INTRAVENOUS | Status: AC
  Administered 2015-02-17: 2 g via INTRAVENOUS
  Filled 2015-02-17: qty 50

## 2015-02-17 MED ORDER — ACETAMINOPHEN 10 MG/ML IV SOLN
INTRAVENOUS | Status: AC
  Filled 2015-02-17: qty 100

## 2015-02-17 MED ORDER — ACETAMINOPHEN 10 MG/ML IV SOLN
INTRAVENOUS | Status: DC | PRN
  Administered 2015-02-17: 1000 mg via INTRAVENOUS

## 2015-02-17 MED ORDER — OXYCODONE-ACETAMINOPHEN 5-325 MG PO TABS: 1.0000 | ORAL_TABLET | ORAL | Status: DC | PRN

## 2015-02-17 MED ORDER — LIDOCAINE HCL (CARDIAC) 20 MG/ML IV SOLN
INTRAVENOUS | Status: DC | PRN
  Administered 2015-02-17: 100 mg via INTRAVENOUS

## 2015-02-17 MED ORDER — EPHEDRINE SULFATE 50 MG/ML IJ SOLN
INTRAMUSCULAR | Status: DC | PRN
  Administered 2015-02-17 (×2): 10 mg via INTRAVENOUS
  Administered 2015-02-17 (×2): 5 mg via INTRAVENOUS

## 2015-02-17 MED ORDER — FENTANYL CITRATE (PF) 100 MCG/2ML IJ SOLN
INTRAMUSCULAR | Status: DC | PRN
  Administered 2015-02-17 (×2): 50 ug via INTRAVENOUS
  Administered 2015-02-17: 100 ug via INTRAVENOUS

## 2015-02-17 MED ORDER — OXYCODONE HCL 5 MG PO TABS: 5.0000 mg | ORAL_TABLET | Freq: Once | ORAL | Status: DC | PRN

## 2015-02-17 SURGICAL SUPPLY — 48 items
BLADE SURG 11 STRL SS SAFETY (MISCELLANEOUS) ×4 IMPLANT
CANISTER SUCT 1200ML W/VALVE (MISCELLANEOUS) ×4 IMPLANT
CANNULA DILATOR 12 W/SLV (CANNULA) IMPLANT
CANNULA DILATOR 12MM W/SLV (CANNULA)
CATH TRAY 16F METER LATEX (MISCELLANEOUS) ×4 IMPLANT
CHLORAPREP W/TINT 26ML (MISCELLANEOUS) ×4 IMPLANT
DEFOGGER SCOPE WARMER CLEARIFY (MISCELLANEOUS) ×4 IMPLANT
DEVICE SECURE STRAP 25 ABSORB (INSTRUMENTS) ×4 IMPLANT
DRAPE INCISE IOBAN 66X45 STRL (DRAPES) ×4 IMPLANT
DRAPE SHEET LG 3/4 BI-LAMINATE (DRAPES) IMPLANT
DRSG TEGADERM 2-3/8X2-3/4 SM (GAUZE/BANDAGES/DRESSINGS) IMPLANT
DRSG TELFA 3X8 NADH (GAUZE/BANDAGES/DRESSINGS) IMPLANT
ELECT REM PT RETURN 9FT ADLT (ELECTROSURGICAL) ×4
ELECTRODE REM PT RTRN 9FT ADLT (ELECTROSURGICAL) ×2 IMPLANT
GLOVE BIO SURGEON STRL SZ7 (GLOVE) ×32 IMPLANT
GOWN STRL REUS W/ TWL LRG LVL3 (GOWN DISPOSABLE) ×8 IMPLANT
GOWN STRL REUS W/TWL LRG LVL3 (GOWN DISPOSABLE) ×8
GRASPER SUT TROCAR 14GX15 (MISCELLANEOUS) ×4 IMPLANT
IRRIGATION STRYKERFLOW (MISCELLANEOUS) IMPLANT
IRRIGATOR STRYKERFLOW (MISCELLANEOUS)
IV LACTATED RINGERS 1000ML (IV SOLUTION) IMPLANT
KIT RM TURNOVER STRD PROC AR (KITS) ×4 IMPLANT
LABEL OR SOLS (LABEL) ×4 IMPLANT
LIQUID BAND (GAUZE/BANDAGES/DRESSINGS) ×4 IMPLANT
MARKER SKIN DUAL TIP RULER LAB (MISCELLANEOUS) IMPLANT
MESH 3DMAX 3X5 LT MED (Mesh General) ×4 IMPLANT
MESH VENTRALEX ST 2.5 CRC MED (Mesh General) ×4 IMPLANT
NEEDLE FILTER BLUNT 18X 1/2SAF (NEEDLE)
NEEDLE FILTER BLUNT 18X1 1/2 (NEEDLE) IMPLANT
NEEDLE VERESS 14GA 120MM (NEEDLE) ×4 IMPLANT
NS IRRIG 500ML POUR BTL (IV SOLUTION) ×4 IMPLANT
PACK LAP CHOLECYSTECTOMY (MISCELLANEOUS) ×4 IMPLANT
SCISSORS METZENBAUM CVD 33 (INSTRUMENTS) ×4 IMPLANT
SHEARS HARMONIC ACE PLUS 36CM (ENDOMECHANICALS) IMPLANT
SLEEVE ENDOPATH XCEL 5M (ENDOMECHANICALS) ×8 IMPLANT
SUT ETHILON 5-0 FS-2 18 BLK (SUTURE) IMPLANT
SUT GTX CV-3 36 TH-26 1/2 TPR (SUTURE) IMPLANT
SUT PDS PLUS 0 (SUTURE) ×2
SUT PDS PLUS AB 0 CT-2 (SUTURE) ×2 IMPLANT
SUT VIC AB 0 CT2 27 (SUTURE) ×4 IMPLANT
SUT VIC AB 4-0 FS2 27 (SUTURE) ×4 IMPLANT
TROCAR VISIPORT PLUS 176673P (TROCAR) IMPLANT
TROCAR XCEL 12X100 BLDLESS (ENDOMECHANICALS) IMPLANT
TROCAR XCEL NON-BLD 11X100MML (ENDOMECHANICALS) ×4 IMPLANT
TROCAR XCEL NON-BLD 5MMX100MML (ENDOMECHANICALS) ×4 IMPLANT
TROCAR XCEL UNIV SLVE 11M 100M (ENDOMECHANICALS) IMPLANT
TUBING INSUFFLATOR HI FLOW (MISCELLANEOUS) ×4 IMPLANT
WATER STERILE IRR 1000ML POUR (IV SOLUTION) IMPLANT

## 2015-02-17 NOTE — Discharge Instructions (Signed)
AMBULATORY SURGERY  °DISCHARGE INSTRUCTIONS ° ° °1) The drugs that you were given will stay in your system until tomorrow so for the next 24 hours you should not: ° °A) Drive an automobile °B) Make any legal decisions °C) Drink any alcoholic beverage ° ° °2) You may resume regular meals tomorrow.  Today it is better to start with liquids and gradually work up to solid foods. ° °You may eat anything you prefer, but it is better to start with liquids, then soup and crackers, and gradually work up to solid foods. ° ° °3) Please notify your doctor immediately if you have any unusual bleeding, trouble breathing, redness and pain at the surgery site, drainage, fever, or pain not relieved by medication. ° ° ° °4) Additional Instructions: ° ° ° ° ° ° ° °Please contact your physician with any problems or Same Day Surgery at [phone], Monday through Friday 6 am to 4 pm, or [place] [facility] [place] Main number at [phone]. °

## 2015-02-17 NOTE — Op Note (Signed)
Preop diagnosis: Umbilical hernia and inguinal hernia  Post op diagnosis: Umbilical and left inguinal hernia  Operation: Laparoscopy and repair of left inguinal hernia with mesh and repair of umbilical hernia with mesh  Surgeon: S.G.Sankar  Assistant:     Anesthesia: Gen.  Complications: None  EBL: Minimal  Drains: None  Description: Patient was put to sleep in supine position the operating table. Foley catheter was inserted which was removed and the end of the procedure. The abdomen was prepped and draped sterile field and timeout performed. Patient had a umbilical hernia right in the central portion of the umbilicus. Incision was made along the upper lip of the umbilicus and extended slightly on either side. The skin overlying the hernial protrusion was freed to reveal what appeared to be a large amount of preperitoneal fat coming through a 2 cm transversely oriented fascial defect. A 11 mm port was positioned in the peritoneal cavity through this area the pneumoperitoneum was obtained. With camera in place 2 lateral 5 mm ports were placed. Evaluation of the inguinal region revealed that there was a small indirect sac   the left side but did not have a definite inguinal hernia on the right- either the direct or indirect type. It was therefore decided to repair left inguinal hernia in the umbilical hernia at this time. The peritoneum superior to the indirect sac on the left inguinal region was incised transversely with the use of cautery. The space occupying the inguinal region was then dissected to expose what appeared to be a fairly sizable's lipomatous mass extending into the inguinal canal along with a small sac of peritoneum. All of this were dissected completely and the cord structures skeletonized. Pubic tubercle medially the inferior epigastric artery and the inguinal ligament below were all well outlined. No direct defect was identified. A Bard 3-D mesh was then positioned and placed  across the left inguinal region. The secure strap was used to tack the mesh to the pubic tubercle and inguinal ligament below and along the muscular area along the medial and superior aspects. The lateral aspect was left untacked. Satisfactory placement of mesh was noted. The peritoneal covering with a reapproximated to cover the mesh also using the secure strap. Attention was directed to her umbilical hernia it was decided to use a 6 cm out ventral accessed the patch which was position into the abdominal cavity and the straps were pulled up. The secure strap was used to tack the edge of the mesh to the peritoneum all around. In order to allow for proper tacking an additional 5 mm port was placed in the left lower abdomen. Following this pneumoperitoneum was released and the ports removed. The umbilical area was adequately exposed and the fascial edges the old up. 2 figure-of-eight stitches of 0 PDS were then used to approximate the fascial opening incorporating the strap and the excess of the strap trimmed off. Repair was adequate. All the skin incision was then closed with subcuticular 4-0 Vicryl covered with liqui  Ban. Procedure was well-tolerated with no immediate problems encountered and he was returned recovery room stable condition.

## 2015-02-17 NOTE — Transfer of Care (Signed)
Immediate Anesthesia Transfer of Care Note  Patient: Mark Hernandez.  Procedure(s) Performed: Procedure(s): LAPAROSCOPIC UMBILICAL HERNIA (N/A) LAPAROSCOPIC INGUINAL HERNIA (Left)  Patient Location: PACU  Anesthesia Type:General  Level of Consciousness: awake, alert , oriented and patient cooperative  Airway & Oxygen Therapy: Patient Spontanous Breathing and Patient connected to nasal cannula oxygen  Post-op Assessment: Report given to RN, Post -op Vital signs reviewed and stable and Patient moving all extremities  Post vital signs: Reviewed and stable  Last Vitals:  Filed Vitals:   02/17/15 0612 02/17/15 0949  BP: 137/87 141/81  Pulse: 74 94  Temp: 36.9 C 36.2 C  Resp: 16 19    Complications: No apparent anesthesia complications

## 2015-02-17 NOTE — Anesthesia Procedure Notes (Addendum)
Procedure Name: Intubation Date/Time: 02/17/2015 7:28 AM Performed by: Peyton Najjar Pre-anesthesia Checklist: Patient identified, Patient being monitored, Timeout performed, Emergency Drugs available and Suction available Patient Re-evaluated:Patient Re-evaluated prior to inductionOxygen Delivery Method: Circle system utilized Preoxygenation: Pre-oxygenation with 100% oxygen Intubation Type: IV induction Ventilation: Mask ventilation without difficulty Laryngoscope Size: Mac and 4 Grade View: Grade II Tube type: Oral Tube size: 7.0 mm Number of attempts: 1 Placement Confirmation: ETT inserted through vocal cords under direct vision,  positive ETCO2 and breath sounds checked- equal and bilateral Secured at: 23 cm Tube secured with: Tape Dental Injury: Teeth and Oropharynx as per pre-operative assessment

## 2015-02-17 NOTE — Interval H&P Note (Signed)
History and Physical Interval Note:  02/17/2015 7:06 AM  Mark Hernandez.  has presented today for surgery, with the diagnosis of umbilical and inguinal hernia  The various methods of treatment have been discussed with the patient and family. After consideration of risks, benefits and other options for treatment, the patient has consented to  Procedure(s): LAPAROSCOPIC UMBILICAL HERNIA (N/A) LAPAROSCOPIC INGUINAL HERNIA (Right) as a surgical intervention .  The patient's history has been reviewed, patient examined, no change in status, stable for surgery.  I have reviewed the patient's chart and labs.  Questions were answered to the patient's satisfaction.     SANKAR,SEEPLAPUTHUR G

## 2015-02-17 NOTE — Anesthesia Preprocedure Evaluation (Signed)
Anesthesia Evaluation  Patient identified by MRN, date of birth, ID band Patient awake    Reviewed: Allergy & Precautions, H&P , NPO status , Patient's Chart, lab work & pertinent test results  History of Anesthesia Complications Negative for: history of anesthetic complications  Airway Mallampati: III  TM Distance: >3 FB Neck ROM: full    Dental  (+) Poor Dentition, Chipped   Pulmonary neg pulmonary ROS, neg shortness of breath,    Pulmonary exam normal breath sounds clear to auscultation       Cardiovascular Exercise Tolerance: Good (-) angina(-) Past MI and (-) DOE negative cardio ROS Normal cardiovascular exam Rhythm:regular Rate:Normal     Neuro/Psych PSYCHIATRIC DISORDERS Anxiety negative neurological ROS     GI/Hepatic negative GI ROS, Neg liver ROS,   Endo/Other  negative endocrine ROS  Renal/GU Renal disease  negative genitourinary   Musculoskeletal   Abdominal   Peds  Hematology negative hematology ROS (+)   Anesthesia Other Findings Past Medical History:   Kidney stone                                                 Rotator cuff tear                                            Anxiety                                                      Arthritis                                                      Comment:BOTH HANDS   Hx MRSA infection                               2009        Past Surgical History:   LITHOTRIPSY                                                   SHOULDER SURGERY                                Bilateral                Comment:rotator cuff repair   SHOULDER ARTHROSCOPY WITH OPEN ROTATOR CUFF RE* Left 09/23/2014      Comment:Procedure: SHOULDER ARTHROSCOPY , debridement,               decompression, WITH  mini OPEN ROTATOR CUFF               REPAIR;  Surgeon: Christena Flake, MD;  Location:  [facility];  Service: Orthopedics;  Laterality:               Left;   COLONOSCOPY                                       2014?       BMI    Body Mass Index   32.47 kg/m 2    Signs and symptoms suggestive of sleep apnea     Reproductive/Obstetrics negative OB ROS                             Anesthesia Physical Anesthesia Plan  ASA: III  Anesthesia Plan: General ETT   Post-op Pain Management:    Induction:   Airway Management Planned:   Additional Equipment:   Intra-op Plan:   Post-operative Plan:   Informed Consent: I have reviewed the patients History and Physical, chart, labs and discussed the procedure including the risks, benefits and alternatives for the proposed anesthesia with the patient or authorized representative who has indicated his/her understanding and acceptance.   Dental Advisory Given  Plan Discussed with: Anesthesiologist, CRNA and Surgeon  Anesthesia Plan Comments:         Anesthesia Quick Evaluation

## 2015-02-17 NOTE — H&P (View-Only) (Signed)
Patient ID: Mark Hernandez., male   DOB: 1960/12/31, 55 y.o.   MRN: [id number]  Chief Complaint  Patient presents with  . Hernia    HPI Mark Hernandez. is a 55 y.o. male.  Here today for evaluation of an umbilical hernia. He states he noticed it about 2 years ago. He does not remember at particular incident that trigger the bulge.  Denies pain. Bowels move daily. 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 HPI  Past Medical History  Diagnosis Date  . Kidney stone   . Rotator cuff tear   . Anxiety     Past Surgical History  Procedure Laterality Date  . Lithotripsy    . Shoulder surgery Bilateral     rotator cuff repair  . Shoulder arthroscopy with open rotator cuff repair Left 09/23/2014    Procedure: SHOULDER ARTHROSCOPY , debridement, decompression, WITH  mini OPEN ROTATOR CUFF REPAIR;  Surgeon: Christena Flake, MD;  Location: [facility];  Service: Orthopedics;  Laterality: Left;  . Colonoscopy  2014?    Family History  Problem Relation Age of Onset  . Prostate cancer Father   . Colon cancer Paternal Grandmother     Social History Social History  Substance Use Topics  . Smoking status: Never Smoker   . Smokeless tobacco: Former Neurosurgeon    Types: Chew    Quit date: 05/07/2004  . Alcohol Use: Yes     Comment: occ    Allergies  Allergen Reactions  . Penicillins Other (See Comments)    REACTION: as child    Current Outpatient Prescriptions  Medication Sig Dispense Refill  . escitalopram (LEXAPRO) 10 MG tablet Take 1 tablet (10 mg total) by mouth daily. 90 tablet 3  . ibuprofen (ADVIL,MOTRIN) 200 MG tablet Take 400 mg by mouth daily as needed.     No current facility-administered medications for this visit.    Review of Systems Review of Systems  Constitutional: Negative.   Respiratory: Negative.   Cardiovascular: Negative.     Blood pressure 148/88, pulse 70, resp. rate 12, height   (1.753 m), weight 226 lb (102.513 kg).  Physical Exam Physical Exam  Constitutional: He is oriented to person, place, and time. He appears well-developed and well-nourished.  HENT:  Mouth/Throat: Oropharynx is clear and moist.  Eyes: Conjunctivae are normal. No scleral icterus.  Neck: Neck supple.  Cardiovascular: Normal rate, regular rhythm and normal heart sounds.   Pulmonary/Chest: Effort normal and breath sounds normal.  Abdominal: Soft. Normal appearance. There is no hepatomegaly. There is no tenderness. A hernia is present. Hernia confirmed positive in the right inguinal area. Hernia confirmed negative in the left inguinal area.  3 cm umbilical hernia partial reducible. Small right inguinal hernia present.  Lymphadenopathy:    He has no cervical adenopathy.  Neurological: He is alert and oriented to person, place, and time.  Skin: Skin is warm and dry.  Psychiatric: His behavior is normal.    Data Reviewed Progress notes.  Assessment    Umbilical and right inguinal hernia. Questionable hernia in left groin    Plan     Hernia precautions and incarceration were discussed with the patient. If they develop symptoms of an incarcerated hernia, they were encouraged to seek prompt medical attention.  I have recommended repair of the hernia using mesh on an outpatient basis in the near future. The risk  of infection was reviewed. The role of prosthetic mesh to minimize the risk of recurrence was reviewed.        PCP:  Crawford Givens This information has been scribed by Dorathy Daft RNBC.  Dorathy Daft M 02/08/2015, 11:59 AM

## 2015-02-17 NOTE — Anesthesia Postprocedure Evaluation (Signed)
Anesthesia Post Note  Patient: Lebron Conners.  Procedure(s) Performed: Procedure(s) (LRB): LAPAROSCOPIC UMBILICAL HERNIA (N/A) LAPAROSCOPIC INGUINAL HERNIA (Left)  Patient location during evaluation: PACU Anesthesia Type: General Level of consciousness: awake and alert Pain management: pain level controlled Vital Signs Assessment: post-procedure vital signs reviewed and stable Respiratory status: spontaneous breathing, nonlabored ventilation, respiratory function stable and patient connected to nasal cannula oxygen Cardiovascular status: blood pressure returned to baseline and stable Postop Assessment: no signs of nausea or vomiting Anesthetic complications: no    Last Vitals:  Filed Vitals:   02/17/15 1029 02/17/15 1041  BP:  131/73  Pulse: 66 82  Temp: 36 C 36.1 C  Resp: 14 16    Last Pain:  Filed Vitals:   02/17/15 1042  PainSc: 2                  Cleda Mccreedy Piscitello

## 2015-02-21 ENCOUNTER — Encounter: Payer: Self-pay | Admitting: General Surgery

## 2015-02-24 ENCOUNTER — Encounter: Payer: Self-pay | Admitting: General Surgery

## 2015-02-24 ENCOUNTER — Ambulatory Visit (INDEPENDENT_AMBULATORY_CARE_PROVIDER_SITE_OTHER): Payer: BLUE CROSS/BLUE SHIELD | Admitting: General Surgery

## 2015-02-24 VITALS — BP 150/88 | HR 74 | Resp 12 | Ht 69.0 in | Wt 231.0 lb

## 2015-02-24 DIAGNOSIS — K429 Umbilical hernia without obstruction or gangrene: Secondary | ICD-10-CM

## 2015-02-24 DIAGNOSIS — K4090 Unilateral inguinal hernia, without obstruction or gangrene, not specified as recurrent: Secondary | ICD-10-CM

## 2015-02-24 NOTE — Progress Notes (Signed)
This is a 55 year old male here today for his post op left inguinal hernia and umbilical hernia repair done on 02/17/15. Patient states he is doing well. I have reviewed the history of present illness with the patient.  At surgerty, no hernia was seen in right inguinal area. He did have a hernia on left inguinal region.  Both hernia repairs are intact.Portsites are clean and healing well. Abdomen is soft, good bowel sounds.  Follow up in 1 mo. Return to work Monday 02-28-15. Advised on slow increase in activity.   PCP:  Crawford Givens This information has been scribed by Ples Specter CMA.

## 2015-02-24 NOTE — Patient Instructions (Signed)
The patient is aware to call back for any questions or concerns.  

## 2015-03-01 ENCOUNTER — Encounter: Payer: Self-pay | Admitting: General Surgery

## 2015-03-02 ENCOUNTER — Encounter: Payer: Self-pay | Admitting: *Deleted

## 2015-03-08 ENCOUNTER — Encounter: Payer: Self-pay | Admitting: Urology

## 2015-03-08 ENCOUNTER — Ambulatory Visit: Payer: Self-pay | Admitting: Urology

## 2015-03-18 ENCOUNTER — Encounter: Payer: Self-pay | Admitting: Urology

## 2015-03-18 ENCOUNTER — Ambulatory Visit (INDEPENDENT_AMBULATORY_CARE_PROVIDER_SITE_OTHER): Payer: BLUE CROSS/BLUE SHIELD | Admitting: Urology

## 2015-03-18 VITALS — BP 138/75 | HR 74 | Ht 69.0 in | Wt 228.4 lb

## 2015-03-18 DIAGNOSIS — R351 Nocturia: Secondary | ICD-10-CM | POA: Diagnosis not present

## 2015-03-18 DIAGNOSIS — Z8042 Family history of malignant neoplasm of prostate: Secondary | ICD-10-CM | POA: Diagnosis not present

## 2015-03-18 DIAGNOSIS — N401 Enlarged prostate with lower urinary tract symptoms: Secondary | ICD-10-CM | POA: Diagnosis not present

## 2015-03-18 DIAGNOSIS — N138 Other obstructive and reflux uropathy: Secondary | ICD-10-CM

## 2015-03-18 NOTE — Progress Notes (Signed)
03/18/2015 9:38 AM   Mark Hernandez. [birth year] [id number]  Referring provider: Joaquim Nam, MD [address] [place], [place] [address]  Chief Complaint  Patient presents with  . Benign Prostatic Hypertrophy    1 year recheck    HPI: Patient is a 55 year old Caucasian male who presents today for his annual prostate exam.  BPH WITH LUTS His IPSS score today is 9, which is moderate lower urinary tract symptomatology. He is least with his quality life due to his urinary symptoms. He denies any dysuria, hematuria or suprapubic pain.  He does have intermittent nocturia, but it is not bothersome to him.  He also denies any recent fevers, chills, nausea or vomiting.  He has a family history of PCa, with father and paternal grandfather having prostate cancer.         IPSS      03/18/15 0900       International Prostate Symptom Score   How often have you had the sensation of not emptying your bladder? Less than 1 in 5     How often have you had to urinate less than every two hours? Less than half the time     How often have you found you stopped and started again several times when you urinated? Less than 1 in 5 times     How often have you found it difficult to postpone urination? Less than 1 in 5 times     How often have you had a weak urinary stream? Less than 1 in 5 times     How often have you had to strain to start urination? Less than 1 in 5 times     How many times did you typically get up at night to urinate? 2 Times     Total IPSS Score 9     Quality of Life due to urinary symptoms   If you were to spend the rest of your life with your urinary condition just the way it is now how would you feel about that? Pleased        Score:  1-7 Mild 8-19 Moderate 20-35 Severe      PMH: Past Medical History  Diagnosis Date  . Kidney stone   . Rotator cuff tear   . Anxiety   . Arthritis     BOTH HANDS  . Hx MRSA infection 2009  . HLD (hyperlipidemia)   .  Over weight   . Nocturia   . BPH (benign prostatic hypertrophy)   . Elevated PSA     Surgical History: Past Surgical History  Procedure Laterality Date  . Lithotripsy      x 2  . Shoulder surgery Bilateral     rotator cuff repair  . Shoulder arthroscopy with open rotator cuff repair Left 09/23/2014    Procedure: SHOULDER ARTHROSCOPY , debridement, decompression, WITH  mini OPEN ROTATOR CUFF REPAIR;  Surgeon: Christena Flake, MD;  Location: [facility];  Service: Orthopedics;  Laterality: Left;  . Colonoscopy  2014?  [name] Umbilical hernia repair N/A 02/17/2015    Procedure: LAPAROSCOPIC UMBILICAL HERNIA;  Surgeon: Kieth Brightly, MD;  Location: [facility];  Service: General;  Laterality: N/A;  . Inguinal hernia repair Left 02/17/2015    Procedure: LAPAROSCOPIC INGUINAL HERNIA;  Surgeon: Kieth Brightly, MD;  Location: [facility];  Service: General;  Laterality: Left;    Home Medications:    Medication List       This  list is accurate as of: 03/18/15  9:38 AM.  Always use your most recent med list.               escitalopram 10 MG tablet  Commonly known as:  LEXAPRO  Take 1 tablet (10 mg total) by mouth daily.     ibuprofen 200 MG tablet  Commonly known as:  ADVIL,MOTRIN  Take 400 mg by mouth daily as needed.     oxyCODONE-acetaminophen 5-325 MG tablet  Commonly known as:  ROXICET  Take 1 tablet by mouth every 4 (four) hours as needed.        Allergies:  Allergies  Allergen Reactions  . Penicillins Other (See Comments)    REACTION: as child    Family History: Family History  Problem Relation Age of Onset  . Prostate cancer Father   . Colon cancer Paternal Grandmother   . Kidney disease Paternal Uncle     Social History:  reports that he has never smoked. He quit smokeless tobacco use about 10 years ago. His smokeless tobacco use included Chew. He reports that he drinks alcohol. He reports that he does not use illicit drugs.  ROS: UROLOGY Frequent  Urination?: No Hard to postpone urination?: No Burning/pain with urination?: No Get up at night to urinate?: Yes Leakage of urine?: No Urine stream starts and stops?: No Trouble starting stream?: No Do you have to strain to urinate?: No Blood in urine?: No Urinary tract infection?: No Sexually transmitted disease?: No Injury to kidneys or bladder?: No Painful intercourse?: No Weak stream?: No Erection problems?: No Penile pain?: No  Gastrointestinal Nausea?: No Vomiting?: No Indigestion/heartburn?: No Diarrhea?: No Constipation?: No  Constitutional Fever: No Night sweats?: No Weight loss?: No Fatigue?: No  Skin Skin rash/lesions?: No Itching?: No  Eyes Blurred vision?: No Double vision?: No  Ears/Nose/Throat Sore throat?: No Sinus problems?: Yes  Hematologic/Lymphatic Swollen glands?: No Easy bruising?: No  Cardiovascular Leg swelling?: No Chest pain?: No  Respiratory Cough?: No Shortness of breath?: No  Endocrine Excessive thirst?: No  Musculoskeletal Back pain?: No Joint pain?: No  Neurological Headaches?: No Dizziness?: No  Psychologic Depression?: No Anxiety?: No  Physical Exam: BP 138/75 mmHg  Pulse 74  Ht  (1.753 m)  Wt 228 lb 6.4 oz (103.602 kg)  BMI 33.71 kg/m2  Constitutional: Well nourished. Alert and oriented, No acute distress. HEENT: [place] AT, moist mucus membranes. Trachea midline, no masses. Cardiovascular: No clubbing, cyanosis, or edema. Respiratory: Normal respiratory effort, no increased work of breathing. GI: Abdomen is soft, non tender, non distended, no abdominal masses. Liver and spleen not palpable.  No hernias appreciated.  Stool sample for occult testing is not indicated.   GU: No CVA tenderness.  No bladder fullness or masses.  Patient with circumcised phallus. Foreskin easily retracted  Urethral meatus is patent.  No penile discharge. No penile lesions or rashes. Scrotum without lesions, cysts, rashes  and/or edema.  Testicles are located scrotally bilaterally. No masses are appreciated in the testicles. Left and right epididymis are normal. Rectal: Patient with  normal sphincter tone. Anus and perineum without scarring or rashes. No rectal masses are appreciated. Prostate and seminal vesicles (could not palpated entire gland due to buttocks tissue) Skin: No rashes, bruises or suspicious lesions. Lymph: No cervical or inguinal adenopathy. Neurologic: Grossly intact, no focal deficits, moving all 4 extremities. Psychiatric: Normal mood and affect.  Laboratory Data: Lab Results  Component Value Date   WBC 10.9* 05/05/2013   HGB  13.7 05/05/2013   HCT 40.5 05/05/2013   MCV 88.4 05/05/2013   PLT 184 05/05/2013   Lab Results  Component Value Date   CREATININE 0.89 05/05/2013      Component Value Date/Time   CHOL 201* 12/28/2014 0825   HDL 45.50 12/28/2014 0825   CHOLHDL 4 12/28/2014 0825   VLDL 20.0 12/28/2014 0825   LDLCALC 135* 12/28/2014 0825   Lab Results  Component Value Date   AST 25 05/05/2013   Lab Results  Component Value Date   ALT 14 05/05/2013   PSA history  1.2 ng/mL on 03/05/2012  0.8 ng/mL on 05/01/2012  1.0 ng/mL on 03/05/2013  0.8 ng/mL on 03/05/2014  Assessment & Plan:    1. BPH (benign prostatic hyperplasia) with LUTS:   I PSS score is 9/1. We will continue to monitor. He will return in 1 year for I PSS score, exam and PSA.  - PSA  2. Nocturia:   Patient is getting up 1 time a night on an intermittent basis. It is not bothersome to him at this time. We will continue to monitor. He'll return in 1 year for I PSS score.  3. Family history of prostate cancer:   Patient is father and paternal grandfather had been diagnosed with prostate cancer.  Patient will have yearly prostate exams and PSA's.  Return in about 1 year (around 03/17/2016) for IPSS score and exam.  These notes generated with voice recognition software. I apologize for typographical  errors.  Michiel Cowboy, PA-C  [facility] [address] [place], [place] [address] [phone]

## 2015-03-19 LAB — PSA: Prostate Specific Ag, Serum: 1 ng/mL (ref 0.0–4.0)

## 2015-03-21 ENCOUNTER — Telehealth: Payer: Self-pay

## 2015-03-21 NOTE — Telephone Encounter (Signed)
Spoke with pt wife and made aware of labs being normal. Wife voiced understanding stating she would let pt know.

## 2015-03-21 NOTE — Telephone Encounter (Signed)
-----   Message from Harle Battiest, PA-C sent at 03/20/2015  6:36 PM EST ----- Patient's PSA is normal.

## 2015-03-24 ENCOUNTER — Ambulatory Visit: Payer: BLUE CROSS/BLUE SHIELD | Admitting: General Surgery

## 2015-03-28 ENCOUNTER — Ambulatory Visit (INDEPENDENT_AMBULATORY_CARE_PROVIDER_SITE_OTHER): Payer: BLUE CROSS/BLUE SHIELD | Admitting: General Surgery

## 2015-03-28 ENCOUNTER — Encounter: Payer: Self-pay | Admitting: General Surgery

## 2015-03-28 VITALS — BP 128/74 | HR 76 | Resp 12 | Ht 69.0 in | Wt 228.0 lb

## 2015-03-28 DIAGNOSIS — K429 Umbilical hernia without obstruction or gangrene: Secondary | ICD-10-CM

## 2015-03-28 DIAGNOSIS — K4090 Unilateral inguinal hernia, without obstruction or gangrene, not specified as recurrent: Secondary | ICD-10-CM

## 2015-03-28 NOTE — Progress Notes (Signed)
This is a 55 year old male here today for his post op left inguinal hernia and umbilical hernia repair done on 02/17/15. Patient states he is doing well. Does have some mild intermittent discomfort in the left groin primarily with activity. Symptoms are mild.  I have reviewed the history of present illness with the patient.   Both hernia repairs are intact.Portsites are clean and healing well. Abdomen is soft. The patient is aware to use an antiinflammatory of choice (Advil or Aleve) as needed for comfort. Patient to return as needed.  PCP:  Para March This information has been scribed by Ples Specter CMA.

## 2015-03-28 NOTE — Patient Instructions (Signed)
Patient to return as needed. 

## 2015-05-06 ENCOUNTER — Ambulatory Visit: Payer: Self-pay | Admitting: Physician Assistant

## 2015-05-06 ENCOUNTER — Encounter: Payer: Self-pay | Admitting: Physician Assistant

## 2015-05-06 VITALS — BP 121/80 | HR 80 | Temp 97.8°F

## 2015-05-06 DIAGNOSIS — S80851A Superficial foreign body, right lower leg, initial encounter: Secondary | ICD-10-CM

## 2015-05-06 NOTE — Progress Notes (Signed)
   Subjective: Foreign body right leg    Patient ID: Mark Connersony G Nilan Jr., male    DOB: 12/28/1960, 55 y.o.   MRN: [id number]  HPI Patient has Thorn broken off in right lower leg for 3 days.  Multiple attempts to remove object  was unsuccessful. Denies signs or symptoms of infection.   Review of Systems    Depression Objective:   Physical Exam Visible foreign body right lower leg.       Assessment & Plan:  Local block, area cleaned. Small incision with #11 blade. Foreign body removed with forceps. Area irrigated and re-clean. Dry sterile dressing apply.  Patient given advise on wound care.  Advised to retrun if signs of infection.

## 2015-06-30 ENCOUNTER — Encounter: Payer: Self-pay | Admitting: General Surgery

## 2015-08-16 ENCOUNTER — Emergency Department (HOSPITAL_COMMUNITY)
Admission: EM | Admit: 2015-08-16 | Discharge: 2015-08-17 | Disposition: A | Discharge status: Home / Self Care | Payer: BLUE CROSS/BLUE SHIELD | Attending: Emergency Medicine | Admitting: Emergency Medicine

## 2015-08-16 ENCOUNTER — Encounter (HOSPITAL_COMMUNITY): Payer: Self-pay | Admitting: Emergency Medicine

## 2015-08-16 DIAGNOSIS — R791 Abnormal coagulation profile: Secondary | ICD-10-CM | POA: Diagnosis not present

## 2015-08-16 DIAGNOSIS — Z79899 Other long term (current) drug therapy: Secondary | ICD-10-CM | POA: Diagnosis not present

## 2015-08-16 DIAGNOSIS — R569 Unspecified convulsions: Secondary | ICD-10-CM | POA: Insufficient documentation

## 2015-08-16 DIAGNOSIS — R55 Syncope and collapse: Secondary | ICD-10-CM | POA: Insufficient documentation

## 2015-08-16 HISTORY — DX: Unspecified convulsions: R56.9

## 2015-08-16 NOTE — ED Notes (Signed)
Per EMS, pt was in bed and began to have a seizure, wife states no hx of seizure

## 2015-08-16 NOTE — ED Notes (Signed)
Per EMS pt was in bed, had just turned the TV off and began to have a seizure.  It is believed that he bit his lip as he has some blood in his mouth after the seizure.  CBG was 126, initial bp was 182/92 upon arrival it was 106/73.  Pt was postdictal repeating questions.

## 2015-08-16 NOTE — ED Provider Notes (Signed)
CSN: [id number]     Arrival date & time 08/16/15  2335 History   First MD Initiated Contact with Patient 08/16/15 2349     No chief complaint on file.  (Consider location/radiation/quality/duration/timing/severity/associated sxs/prior Treatment) HPI 55 y.o. male, presents to the Emergency Department today complaining of seizure activity x 1 hour ago. Pt states that he was at home watching TV when he lost consciousness. Per wife, pt was lying in bed with her when he started breathing rapidly. She turned on the light and notice tonic-clonic jerking with eyes fixed. States that she believes in lasted only a few minutes. Definitely <685min. Pt was post-ictal with confusion. No urinary incontinence Pt has bite on tongue on right side. Pt confessed that he used to be Epileptic and was on phenobarbitol as a child. Has since been off of medication. Seen in 2015 for similar seizure episode. Seen by Neurologist with negative MRI and EEG. Pt did not confess epilepsy to neurologist. Currently pt without CP/SOB/ABD pain. No N/V/D. No headaches. No vision changes. No dizziness. No other symptoms noted.     Neurology appointment on 04/2013 noted seizure activity likely from stress/insomnia. EEG normal. MRI normal. At that time, pt had urinary incontinence and was post-ictal. Otherwise unremarkable during that time.   Past Medical History  Diagnosis Date  . Kidney stone   . Rotator cuff tear   . Anxiety   . Arthritis     BOTH HANDS  . Hx MRSA infection 2009  . HLD (hyperlipidemia)   . Over weight   . Nocturia   . BPH (benign prostatic hypertrophy)   . Elevated PSA    Past Surgical History  Procedure Laterality Date  . Lithotripsy      x 2  . Shoulder surgery Bilateral     rotator cuff repair  . Shoulder arthroscopy with open rotator cuff repair Left 09/23/2014    Procedure: SHOULDER ARTHROSCOPY , debridement, decompression, WITH  mini OPEN ROTATOR CUFF REPAIR;  Surgeon: Christena FlakeJohn J Poggi, MD;  Location: [facility]  ORS;  Service: Orthopedics;  Laterality: Left;  . Colonoscopy  2014?  [name]. Umbilical hernia repair N/A 02/17/2015    Procedure: LAPAROSCOPIC UMBILICAL HERNIA;  Surgeon: Kieth BrightlySeeplaputhur G Sankar, MD;  Location: [facility] ORS;  Service: General;  Laterality: N/A;  . Inguinal hernia repair Left 02/17/2015    Procedure: LAPAROSCOPIC INGUINAL HERNIA;  Surgeon: Kieth BrightlySeeplaputhur G Sankar, MD;  Location: [facility] ORS;  Service: General;  Laterality: Left;   Family History  Problem Relation Age of Onset  . Prostate cancer Father   . Colon cancer Paternal Grandmother   . Kidney disease Paternal Uncle    Social History  Substance Use Topics  . Smoking status: Never Smoker   . Smokeless tobacco: Former NeurosurgeonUser    Types: Chew    Quit date: 05/07/2004  . Alcohol Use: Yes     Comment: occ    Review of Systems ROS reviewed and all are negative for acute change except as noted in the HPI.  Allergies  Penicillins  Home Medications   Prior to Admission medications   Medication Sig Start Date End Date Taking? Authorizing Provider  escitalopram (LEXAPRO) 10 MG tablet Take 1 tablet (10 mg total) by mouth daily. Patient taking differently: Take 10 mg by mouth every morning.  12/31/14   Joaquim NamGraham S Duncan, MD   Pulse 95  Resp 20  SpO2 97%   Physical Exam  Constitutional: He is oriented to person, place, and time. He appears well-developed and  well-nourished.  HENT:  Head: Normocephalic and atraumatic.  Bite mark on right mid tongue. Bleeding controlled.   Eyes: EOM are normal. Pupils are equal, round, and reactive to light.  Neck: Normal range of motion. Neck supple. No tracheal deviation present.  Cardiovascular: Normal rate, regular rhythm, normal heart sounds and intact distal pulses.   No murmur heard. Pulmonary/Chest: Effort normal and breath sounds normal. No respiratory distress. He has no wheezes. He has no rales. He exhibits no tenderness.  Abdominal: Soft.  Musculoskeletal: Normal range of motion.   Neurological: He is alert and oriented to person, place, and time. He has normal strength. No cranial nerve deficit or sensory deficit.  Negative Pronator Drift.  Cranial Nerves:  II: Pupils equal, round, reactive to light III,IV, VI: ptosis not present, extra-ocular motions intact bilaterally  V,VII: smile symmetric, facial light touch sensation equal VIII: hearing grossly normal bilaterally  IX,X: midline uvula rise  XI: bilateral shoulder shrug equal and strong XII: midline tongue extension  Skin: Skin is warm and dry.  Psychiatric: He has a normal mood and affect. His behavior is normal. Thought content normal.  Nursing note and vitals reviewed.  ED Course  Procedures (including critical care time) Labs Review Labs Reviewed  COMPREHENSIVE METABOLIC PANEL  CBC WITH DIFFERENTIAL/PLATELET  URINE RAPID DRUG SCREEN, HOSP PERFORMED  PROTIME-INR  URINALYSIS, ROUTINE W REFLEX MICROSCOPIC (NOT AT [facility])   Imaging Review No results found. I have personally reviewed and evaluated these images and lab results as part of my medical decision-making.   EKG Interpretation None      MDM  I have reviewed and evaluated the relevant laboratory values I have reviewed and evaluated the relevant imaging studies. I have reviewed the relevant previous healthcare records. I have reviewed EMS Documentation. I obtained HPI from historian. Patient discussed with supervising physician  ED Course:  Assessment: Pt is a 54yM who presents with seizure activity x 1 hour ago. Hx of epilepsy as a child and on phenobarbital. Did not disclose to a neurologist he has seen in 2015 for similar episode. During event, had tonic-clonic jerking <29min. Postical with confusion. Tongue bite on right mid tongue. On exam, pt in NAD. Nontoxic/nonseptic appearing. VSS. Afebrile. Lungs CTA. Heart RRR. CN evaluated and unremarkable. Lab work pending. CT Head pending. Given keppra load in ED. Rx Keppra at DC. Plan is to DC  home with follow up to Neurology. Discussed with patient seizure precautions and restrictions. At time of discharge, Patient is in no acute distress. Vital Signs are stable. Patient is able to ambulate. Patient able to tolerate PO.    Disposition/Plan:  Pending Lab work and Imaging 12:32 AM- Further care provided by Azalia Bilis, MD (please see note)   Supervising Physician Azalia Bilis, MD   Final diagnoses:  Seizure [facility])    Audry Pili, PA-C 08/17/15 0032  Azalia Bilis, MD 08/17/15 4540

## 2015-08-17 ENCOUNTER — Emergency Department (HOSPITAL_COMMUNITY): Payer: BLUE CROSS/BLUE SHIELD

## 2015-08-17 LAB — COMPREHENSIVE METABOLIC PANEL
ALBUMIN: 3.7 g/dL (ref 3.5–5.0)
ALT: 17 U/L (ref 17–63)
AST: 25 U/L (ref 15–41)
Alkaline Phosphatase: 64 U/L (ref 38–126)
Anion gap: 10 (ref 5–15)
BILIRUBIN TOTAL: 0.5 mg/dL (ref 0.3–1.2)
BUN: 18 mg/dL (ref 6–20)
CO2: 25 mmol/L (ref 22–32)
Calcium: 10 mg/dL (ref 8.9–10.3)
Chloride: 103 mmol/L (ref 101–111)
Creatinine, Ser: 1.05 mg/dL (ref 0.61–1.24)
GFR calc Af Amer: >60 mL/min (ref >60)
GFR calc non Af Amer: >60 mL/min (ref >60)
GLUCOSE: 108 mg/dL — AB (ref 65–99)
POTASSIUM: 3.6 mmol/L (ref 3.5–5.1)
Sodium: 138 mmol/L (ref 135–145)
TOTAL PROTEIN: 6.7 g/dL (ref 6.5–8.1)

## 2015-08-17 LAB — CBC WITH DIFFERENTIAL/PLATELET
BASOS ABS: 0 10*3/uL (ref 0.0–0.1)
Basophils Relative: 0 %
EOS PCT: 2 %
Eosinophils Absolute: 0.1 10*3/uL (ref 0.0–0.7)
HCT: 40.1 % (ref 39.0–52.0)
Hemoglobin: 13.4 g/dL (ref 13.0–17.0)
LYMPHS ABS: 1.4 10*3/uL (ref 0.7–4.0)
LYMPHS PCT: 16 %
MCH: 29.4 pg (ref 26.0–34.0)
MCHC: 33.4 g/dL (ref 30.0–36.0)
MCV: 87.9 fL (ref 78.0–100.0)
MONO ABS: 1 10*3/uL (ref 0.1–1.0)
MONOS PCT: 12 %
Neutro Abs: 6.1 10*3/uL (ref 1.7–7.7)
Neutrophils Relative %: 70 %
PLATELETS: 181 10*3/uL (ref 150–400)
RBC: 4.56 MIL/uL (ref 4.22–5.81)
RDW: 12.6 % (ref 11.5–15.5)
WBC: 8.6 10*3/uL (ref 4.0–10.5)

## 2015-08-17 LAB — PROTIME-INR
INR: 1.01 (ref 0.00–1.49)
Prothrombin Time: 13.5 seconds (ref 11.6–15.2)

## 2015-08-17 MED ORDER — LEVETIRACETAM 500 MG PO TABS
1000.0000 mg | ORAL_TABLET | Freq: Once | ORAL | Status: AC
  Administered 2015-08-17: 1000 mg via ORAL
  Filled 2015-08-17: qty 2

## 2015-08-17 MED ORDER — LEVETIRACETAM 500 MG PO TABS: 500.0000 mg | ORAL_TABLET | Freq: Two times a day (BID) | ORAL | Status: DC

## 2015-08-17 MED ORDER — OXYCODONE-ACETAMINOPHEN 5-325 MG PO TABS
1.0000 | ORAL_TABLET | Freq: Once | ORAL | Status: AC
  Administered 2015-08-17: 1 via ORAL
  Filled 2015-08-17: qty 1

## 2015-08-17 NOTE — Discharge Instructions (Signed)
You were diagnosed with a seizure today. Please take seizure medicine as prescribed.  Seizure Precautions: It is not legal to drive within six months of a disabiling seziure, and you are responsible for updating the [facility] of Public Safety on your condition. Swimming alone or bathing in a tub can lead to drowning if a seizure were to occur. Precautions should be taken with hot liquids (e.g. Boiling water, cooking oil), we suggest keeping water heaters below 120 Fahrenheit. Caution should also be taken with appliances such as blow dryers, power tools, lawn mowers, etc. That could be dagerous in the event of a seizure. Avoid activities that require being alert such as operating equipment (like an airplane or forklift, driving a vehicle, swimming, bathing, climbing, or other activities during which having a seizure would put you or someone else in danger. This applies to hobbies or sports as well. Since falls are a common problem for seizure patients, please evaluate your home for sharp objects or corners that could be dangerous un such an event.

## 2015-08-17 NOTE — ED Notes (Signed)
Patient transported to CT 

## 2015-08-18 ENCOUNTER — Telehealth: Payer: Self-pay | Admitting: Family Medicine

## 2015-08-18 NOTE — Telephone Encounter (Signed)
PLEASE NOTE: All timestamps contained within this report are represented as [place] Time. CONFIDENTIALTY NOTICE: This fax transmission is intended only for the addressee. It contains information that is legally privileged, confidential or otherwise protected from use or disclosure. If you are not the intended recipient, you are strictly prohibited from reviewing, disclosing, copying using or disseminating any of this information or taking any action in reliance on or regarding this information. If you have received this fax in error, please notify us immediately by telephone so that we can arrange for its return to us. Phone: [phone], Toll-Free: [phone], Fax: [phone] Page: 1 of 2 Call Id: [id number] [facility] - Client TELEPHONE ADVICE RECORD [facility] Patient Name: Mark Hernandez Gender: Male DOB: 07/14/1960 Age: 55 Y 8 M 26 D 26 D Return Phone Number: [phone] (Primary), [phone] (Secondary) Address: City/State/Zip: [place] [id number] Client [facility] - Client [facility] Day Physician Raechel Acheuncan, Shaw - MD Contact Type Call Who Is Calling Patient / Member / Family / Caregiver Call Type Triage / Clinical Caller Name Veronda PrudeSusan Mellone Relationship To Patient Spouse Return Phone Number [phone] (Primary) Chief Complaint Fever (non urgent symptom) (> THREE MONTHS) Reason for Call Symptomatic / Request for Health Information Initial Comment Callers husband was seen inER, prescribed Keppra 500mg  for seizure running a fever wants to know if this is normal or should they be seen. He is also very tired Appointment Disposition EMR Appointment Attempted - Not Scheduled Info pasted into Epic Yes PreDisposition Call Doctor Translation No Nurse Assessment Nurse: Ladona RidgelGaddy, RN, Felicia Date/Time (Eastern Time): 08/18/2015 3:37:58 PM Confirm and document reason for call. If  symptomatic, describe symptoms. You must click the next button to save text entered. ---PT has 101.8 oral fever onset yesterday. Only other symptoms is HA onset yesterdayand fatigue- mild after ibuprofen and was moderate before. Has the patient traveled out of the country within the last 30 days? ---No Does the patient have any new or worsening symptoms? ---Yes Will a triage be completed? ---Yes Related visit to physician within the last 2 weeks? ---No Does the PT have any chronic conditions? (i.e. diabetes, asthma, etc.) ---No Is this a behavioral health or substance abuse call? ---No Guidelines Guideline Title Affirmed Question Affirmed Notes Nurse Date/Time (Eastern Time) Headache [1] New headache AND [2] age > 55 Ladona RidgelGaddy, RN, Sunny SchleinFelicia 08/18/2015 3:39:21 PM PLEASE NOTE: All timestamps contained within this report are represented as [place] Standard Time. CONFIDENTIALTY NOTICE: This fax transmission is intended only for the addressee. It contains information that is legally privileged, confidential or otherwise protected from use or disclosure. If you are not the intended recipient, you are strictly prohibited from reviewing, disclosing, copying using or disseminating any of this information or taking any action in reliance on or regarding this information. If you have received this fax in error, please notify us immediately by telephone so that we can arrange for its return to us. Phone: [phone], Toll-Free: [phone], Fax: [phone] Page: 2 of 2 Call Id: [id number] Disp. Time Lamount Cohen(Eastern Time) Disposition Final User 08/18/2015 4:00:50 PM Call Completed Ladona RidgelGaddy, RN, Sunny SchleinFelicia 08/18/2015 3:55:51 PM See Physician within 4 Hours (or PCP triage) Yes Ladona RidgelGaddy, RN, Sunny SchleinFelicia Disposition Overriden: See Physician within 24 Hours Override Reason: Specify reason. (Please document in 'advice recommended' section) Caller Understands: Yes Disagree/Comply: Comply Care Advice Given Per Guideline SEE  PHYSICIAN WITHIN 24 HOURS: * Take 650 mg (two 325 mg pills) by mouth  every 4-6 hours as needed. Each Regular Strength Tylenol pill has 325 mg of acetaminophen. The most you should take each day is 3,250 mg (10 Regular Strength pills a day). * Take 400 mg (two 200 mg pills) by mouth every 6 hours as needed. * Another choice is to take 1,000 mg (two 500 mg pills) every 8 hours as needed. Each Extra Strength Tylenol pill has 500 mg of acetaminophen. The most you should take each day is 3,000 mg (6 Extra Strength pills a day). * Do not take nonsteroidal anti-inflammatory drugs (NSAIDs) if you have stomach problems, kidney disease, heart failure, or other contraindications to using this type of medicine. * GASTROINTESTINAL RISK: There is an increased risk of stomach ulcers, GI bleeding, perforation. * CARDIOVASCULAR RISK: There may be an increased risk of heart attack and stroke. * Do not take NSAID medicines for over 7 days without consulting your PCP. * You may take this medicine with or without food. Taking it with food or milk may lessen the chance the drug will upset your stomach. REST: Lie down in a dark quiet place and relax until feeling better. LOCAL COLD: Apply a cold wet washcloth or cold pack to the forehead for 20 minutes * Blurred vision or double vision occurs * Numbness or weakness of the face, arm or leg * Difficulty with speaking * You become worse. SEE PHYSICIAN WITHIN 4 HOURS (or PCP triage): * Take 650 mg (two 325 mg pills) by mouth every 4-6 hours as needed. Each Regular Strength Tylenol pill has 325 mg of acetaminophen. The most you should take each day is 3,250 mg (10 Regular Strength pills a day). LOCAL COLD: Apply a cold wet washcloth or cold pack to the forehead for 20 minutes * You become worse. * Another choice is to take 1,000 mg (two 500 mg pills) every 8 hours as needed. Each Extra Strength Tylenol pill has 500 mg of acetaminophen. The most you should take each day is 3,000  mg (6 Extra Strength pills a day). * Take 400 mg (two 200 mg pills) by mouth every 6 hours as needed. Comments User: Hampton Abbot, RN Date/Time Lamount Cohen Time): 08/18/2015 3:41:31 PM seizure that lasted less than 1 min 2 nights ago and one 2 1/2 yrs ago and they put him on keppra - and they went to ER. - not admitted. They did CT and everything came out ok. They have neuro appt next week. SHe called neuro about fever today and they said to call us since he had not been seen yet User: Hampton Abbot, RN Date/Time (Eastern Time): 08/18/2015 3:57:06 PM upgraded in case the MD would figure out cause of infection in case it is related to seizure 2 d ago though no symptoms except moderate HA and fever now. Referrals GO TO FACILITY OTHER - SPECIFY

## 2015-08-18 NOTE — Telephone Encounter (Signed)
Wife Rosalita Chessman(Suzanne) advised.

## 2015-08-18 NOTE — Telephone Encounter (Signed)
[facility] - Client TELEPHONE ADVICE RECORD [facility] Patient Name: Mark Hernandez DOB: 23-Jul-1960 Initial Comment Callers husband was seen inER, prescribed Keppra 500mg  for seizure running a fever wants to know if this is normal or should they be seen. He is also very tired Nurse Assessment Nurse: Ladona RidgelGaddy, RN, Felicia Date/Time (Eastern Time): 08/18/2015 3:37:58 PM Confirm and document reason for call. If symptomatic, describe symptoms. You must click the next button to save text entered. ---PT has 101.8 oral fever onset yesterday. Only other symptoms is HA onset yesterdayand fatigue- mild after ibuprofen and was moderate before. Has the patient traveled out of the country within the last 30 days? ---No Does the patient have any new or worsening symptoms? ---Yes Will a triage be completed? ---Yes Related visit to physician within the last 2 weeks? ---NoDoes the PT have any chronic conditions? (i.e. diabetes, asthma, etc.) ---No Is this a behavioral health or substance abuse call? ---No Guidelines Guideline Title Affirmed Question Affirmed Notes Headache [1] New headache AND [2] age > 7650 Final Disposition User See Physician within 4 Hours (or PCP triage) Ladona RidgelGaddy, RN, Felicia Comments seizure that lasted less than 1 min 2 nights ago and one 2 1/2 yrs ago and they put him on keppra - and they went to ER. - not admitted. They did CT and everything came out ok. They have neuro appt next week. SHe called neuro about fever today and they said to call us since he had not been seen yet upgraded in case the MD would figure out cause of infection in case it is related to seizure 2 d ago though no symptoms exceptmoderate HA and fever now. Referrals GO TO FACILITY OTHER - SPECIFY Disagree/Comply: Comply Call Id: [id number]

## 2015-08-18 NOTE — Telephone Encounter (Signed)
He should be seen tonight.  This may be unrelated, but I would still get checked in the meantime.   If any new neuro sx, then to ER in the meantime.  Thanks.

## 2015-08-19 ENCOUNTER — Encounter: Payer: Self-pay | Admitting: Primary Care

## 2015-08-19 ENCOUNTER — Ambulatory Visit (INDEPENDENT_AMBULATORY_CARE_PROVIDER_SITE_OTHER): Payer: BLUE CROSS/BLUE SHIELD | Admitting: Primary Care

## 2015-08-19 VITALS — BP 122/74 | HR 86 | Temp 98.1°F | Ht 69.0 in | Wt 220.8 lb

## 2015-08-19 DIAGNOSIS — K140 Glossitis: Secondary | ICD-10-CM

## 2015-08-19 MED ORDER — SULFAMETHOXAZOLE-TRIMETHOPRIM 800-160 MG PO TABS: 1.0000 | ORAL_TABLET | Freq: Two times a day (BID) | ORAL | Status: DC

## 2015-08-19 NOTE — Progress Notes (Signed)
Pre visit review using our clinic review tool, if applicable. No additional management support is needed unless otherwise documented below in the visit note. 

## 2015-08-19 NOTE — Progress Notes (Signed)
Subjective:    Patient ID: Mark Hernandez., male    DOB: November 08, 1960, 55 y.o.   MRN: [id number]  HPI  [name] is a 55 year old male who presents today with a chief complaint of fever. His fever has been present since Wednesday this week. Later Wednesday night he experienced a seizure while watching TV and became unconscious. His wife took him to the emergency department where he was evaluated. CT head unremarkable. Labs unremarkable. Patient discharged home later that evening and was stable without any further seizures. He has an appointment scheduled with his neurologist next week.  He will experience fatigue and feeling tired which causes him to sleep all day. He's been taking Advil for his fevers with improvement temporarily. Just prior to his arrival today he broke out in sweats, last dose of ibuprofen was at 7 AM today. His fevers are running 101 on average.   Denies cough, congestion, abdominal pain, nausea, congestion, rashes, tick bites. No one else in his household is experiencing fevers. Overall he's feeling better now. He does have a sore on the right side of his tongue (after biting during seizure) that has slightly improved but continues to be painful.    Review of Systems  Constitutional: Positive for fever and fatigue. Negative for chills.  HENT: Negative for congestion and rhinorrhea.   Respiratory: Negative for shortness of breath.   Cardiovascular: Negative for chest pain.  Gastrointestinal: Negative for nausea.  Skin: Negative for rash.  Neurological: Negative for dizziness, seizures, weakness and light-headedness.       Past Medical History  Diagnosis Date  . Kidney stone   . Rotator cuff tear   . Anxiety   . Arthritis     BOTH HANDS  . Hx MRSA infection 2009  . HLD (hyperlipidemia)   . Over weight   . Nocturia   . BPH (benign prostatic hypertrophy)   . Elevated PSA   . Seizures [facility])      Social History   Social History  . Marital Status: Married      Spouse Name: N/A  . Number of Children: N/A  . Years of Education: N/A   Occupational History  . Not on file.   Social History Main Topics  . Smoking status: Never Smoker   . Smokeless tobacco: Former Neurosurgeon    Types: Chew    Quit date: 05/07/2004  . Alcohol Use: Yes     Comment: occ  . Drug Use: No  . Sexual Activity: Not on file   Other Topics Concern  . 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 Lithotripsy      x 2  . Shoulder surgery Bilateral     rotator cuff repair  . Shoulder arthroscopy with open rotator cuff repair Left 09/23/2014    Procedure: SHOULDER ARTHROSCOPY , debridement, decompression, WITH  mini OPEN ROTATOR CUFF REPAIR;  Surgeon: Christena Flake, MD;  Location: [facility] ORS;  Service: Orthopedics;  Laterality: Left;  . Colonoscopy  2014?  [name] Umbilical hernia repair N/A 02/17/2015    Procedure: LAPAROSCOPIC UMBILICAL HERNIA;  Surgeon: Kieth Brightly, MD;  Location: [facility] ORS;  Service: General;  Laterality: N/A;  . Inguinal hernia repair Left 02/17/2015    Procedure: LAPAROSCOPIC INGUINAL HERNIA;  Surgeon:  Seeplaputhur Wynona LunaG Sankar, MD;  Location: [facility] ORS;  Service: General;  Laterality: Left;    Family History  Problem Relation Age of Onset  . Prostate cancer Father   . Colon cancer Paternal Grandmother   . Kidney disease Paternal Uncle     Allergies  Allergen Reactions  . Penicillins Other (See Comments)    REACTION: as child    Current Outpatient Prescriptions on File Prior to Visit  Medication Sig Dispense Refill  . escitalopram (LEXAPRO) 10 MG tablet Take 1 tablet (10 mg total) by mouth daily. 90 tablet 3  . levETIRAcetam (KEPPRA) 500 MG tablet Take 1 tablet (500 mg total) by mouth 2 (two) times daily. 60 tablet 0   No current facility-administered medications on file prior to  visit.    BP 122/74 mmHg  Pulse 86  Temp(Src) 98.1 F (36.7 C) (Oral)  Ht 5\' 9"  (1.753 m)  Wt 220 lb 12.8 oz (100.154 kg)  BMI 32.59 kg/m2  SpO2 97%    Objective:   Physical Exam  Constitutional: He is oriented to person, place, and time. He appears well-nourished. He does not appear ill.  Appears tired.  HENT:  Right Ear: Tympanic membrane and ear canal normal.  Left Ear: Tympanic membrane and ear canal normal.  Nose: No mucosal edema. Right sinus exhibits no maxillary sinus tenderness and no frontal sinus tenderness. Left sinus exhibits no maxillary sinus tenderness and no frontal sinus tenderness.  Mouth/Throat: Oropharynx is clear and moist.  Eyes: Conjunctivae and EOM are normal. Pupils are equal, round, and reactive to light.  Neck: Neck supple.  Cardiovascular: Normal rate and regular rhythm.   Pulmonary/Chest: Effort normal and breath sounds normal. He has no wheezes. He has no rales.  Neurological: He is alert and oriented to person, place, and time. No cranial nerve deficit.  Skin: Skin is warm and dry.          Assessment & Plan:  Fever:  Present since early Wednesday, sounds as though he had a febrile seizure later that night. Evaluated in the emergency department Wednesday night, post seizure, and discharged home as examination and testing was unremarkable. No seizures since Wednesday, however fever has persisted intermittently. No fever and the office today, took ibuprofen several hours ago.  Exam today unremarkable except for tongue irritation/possible infection from biting after seizure. Inflammation, erythema, mild puslike substance present. Given presence of fevers and likely infection to tongue, will treat. Penicillin allergy, Bactrim sent to pharmacy for a short course for tongue infection. Continue Advil or Tylenol. He will follow up with his neurologist next week. Discussed to refrain from driving until further notice.  Recent emergency department  notes, imaging, labs reviewed.  Morrie Sheldonlark,Mark Goodson Kendal, NP  Return precautions provided.

## 2015-08-19 NOTE — Patient Instructions (Signed)
Continue Advil or tylenol as needed for fevers.  Start Bactrim DS (sulfamethoxazole/trimethoprim) tablets for tongue infection. Take 1 tablet by mouth twice daily for 5 days.  Follow up with your neurologist next week as scheduled.  Please give us a call Monday if your fevers persist over the weekend. It's important to keep your fevers reduced to prevent another seizure.  It was a pleasure meeting you!

## 2015-08-24 ENCOUNTER — Telehealth: Payer: Self-pay | Admitting: Family Medicine

## 2015-08-24 NOTE — Telephone Encounter (Signed)
Pt has appt with Dr Para March 08/25/15.

## 2015-08-24 NOTE — Telephone Encounter (Signed)
Patient Name: Mark Hernandez DOB: [birth year] Initial Comment Husband was seen Friday, treated for infection. finished ABX yesterday and still running low grade fever. Nurse Assessment Nurse: Yetta Barre, RN, Miranda Date/Time (Eastern Time): 08/24/2015 11:56:20 AM Confirm and document reason for call. If symptomatic, describe symptoms. You must click the next button to save text entered. ---Spoke with pt, states he bite his tongue 1 week ago and started running a fever. He was seen on Friday and prescribed a short course of antibiotics which he has finished. He is still running a fever. Temp 100.5. He denies any other symptoms and states his tongue is only mildly sore from where he bit it. Has the patient traveled out of the country within the last 30 days? ---Not Applicable Does the patient have any new or worsening symptoms? ---Yes Will a triage be completed? ---Yes Related visit to physician within the last 2 weeks? ---Yes Does the PT have any chronic conditions? (i.e. diabetes, asthma, etc.) ---Yes List chronic conditions. ---Seizures, Depression/stress Is this a behavioral health or substance abuse call? ---No Guidelines Guideline Title Affirmed Question Affirmed Notes Fever Fever present > 3 days (72 hours) Final Disposition User See Physician within 24 Hours Jones, RN, [employer] number provided by the caller and she is not with the pt. She did provide the pt's number. Appt scheduled with Dr. Para March for tomorrow 7/27 at 2:00pm Referrals REFERRED TO PCP OFFICE Disagree/Comply: Comply

## 2015-08-25 ENCOUNTER — Encounter: Payer: Self-pay | Admitting: Family Medicine

## 2015-08-25 ENCOUNTER — Ambulatory Visit (INDEPENDENT_AMBULATORY_CARE_PROVIDER_SITE_OTHER)
Admission: RE | Admit: 2015-08-25 | Discharge: 2015-08-25 | Disposition: A | Discharge status: Home / Self Care | Payer: BLUE CROSS/BLUE SHIELD | Source: Ambulatory Visit | Attending: Family Medicine | Admitting: Family Medicine

## 2015-08-25 ENCOUNTER — Ambulatory Visit (INDEPENDENT_AMBULATORY_CARE_PROVIDER_SITE_OTHER): Payer: BLUE CROSS/BLUE SHIELD | Admitting: Family Medicine

## 2015-08-25 VITALS — BP 104/66 | HR 84 | Temp 99.2°F | Wt 215.2 lb

## 2015-08-25 DIAGNOSIS — R509 Fever, unspecified: Secondary | ICD-10-CM | POA: Diagnosis not present

## 2015-08-25 NOTE — Telephone Encounter (Signed)
Noted. Thanks.

## 2015-08-25 NOTE — Progress Notes (Signed)
Pre visit review using our clinic review tool, if applicable. No additional management support is needed unless otherwise documented below in the visit note.  Prev with anxiety, had been controlled with SSRI prev.    Had bitten his tongue with likely SZ.  Seen at ER and started on keppra.   Still with persistent fever, coming back daily, usually in the evening.  Fever everyday since the ER visit.  Temp up to 103.5.  Prev placed on abx for possible tongue infection.    Some cough.  No blood in stool, no abd pain.  No sputum.  No ST.  No ear pain.  HA noted, unclear if from fever.  Tongue still hurts some, less than prev.  No tick bites recently.  No rash.  No burning with urination.    Had tick labs done at [facility] on Saturday, pending.    PMH and SH reviewed  ROS: Per HPI unless specifically indicated in ROS section   Meds, vitals, and allergies reviewed.   GEN: nad, alert and oriented HEENT: mucous membranes moist, OP wnl, with healing tongue lac noted on R side of tongue, doesn't appear infected.   NECK: supple w/o LA CV: rrr. PULM: ctab, no inc wob ABD: soft, +bs EXT: no edema SKIN: no acute rash

## 2015-08-25 NOTE — Patient Instructions (Signed)
Try to get the lab results from urgent care.  Go to the lab on the way out.  We'll contact you with your xray report. We'll go from there.  Take care.  Glad to see you.

## 2015-08-26 ENCOUNTER — Encounter: Payer: Self-pay | Admitting: Neurology

## 2015-08-26 ENCOUNTER — Ambulatory Visit (INDEPENDENT_AMBULATORY_CARE_PROVIDER_SITE_OTHER): Payer: BLUE CROSS/BLUE SHIELD | Admitting: Neurology

## 2015-08-26 ENCOUNTER — Telehealth: Payer: Self-pay | Admitting: Family Medicine

## 2015-08-26 VITALS — BP 127/76 | HR 102 | Temp 100.2°F | Ht 69.0 in | Wt 216.6 lb

## 2015-08-26 DIAGNOSIS — G40309 Generalized idiopathic epilepsy and epileptic syndromes, not intractable, without status epilepticus: Secondary | ICD-10-CM | POA: Diagnosis not present

## 2015-08-26 DIAGNOSIS — G40A09 Absence epileptic syndrome, not intractable, without status epilepticus: Secondary | ICD-10-CM | POA: Diagnosis not present

## 2015-08-26 DIAGNOSIS — R509 Fever, unspecified: Secondary | ICD-10-CM | POA: Insufficient documentation

## 2015-08-26 DIAGNOSIS — R569 Unspecified convulsions: Secondary | ICD-10-CM

## 2015-08-26 NOTE — Progress Notes (Signed)
[facility]    Provider:  Dr Lucia Gaskins Referring Provider: Joaquim Nam, MD Primary Care Physician:  Crawford Givens, MD  CC:  seizures  HPI:  Mark Francke. is a 55 y.o. male here as a referral from Dr. Para March for seizures. Past medical history of syncope and collapse, nocturia, BPH, work-related stress and epilepsy. Last Tuesday they were watching TV and he started breathing heavy and he started jerking whole body for one minute in the setting of illness and fever, bit tongue, eyes open, confused afterwards. Speech was slurred. Blood coming out of mouth. Wife provides information. He had staring spells as a child and he was diagnosed with epilepsy, absence and was started on phenobarb at the age of 51 was on it for 20 years. He has been off of the medicine for 20 years. In 2015 he had an episode of vertigo, he was under stress, then he was "out" no one witnessed it, he was found confused, he bit his tongue. He had been outside the whole day before this recent event, he has a fever. No family history of seizures. No other focal neurologic deficits or complaints.  Reviewed notes, labs and imaging from outside physicians, which showed:  CBC was normal, CMP was unremarkable labs 08/17/2015.  Patient was seen in the emergency room 08/16/2015. Complained of seizure activity for 1 hour. He was somewhat she TV when he lost consciousness. Per wife he was lying in bed with her when he started breathing rapidly and she noticed tonic-clonic jerking with eyes fixed. Lasted a few minutes. Postictal confusion. Patient bit his tongue. No urinary incontinence. Patient confessed that he used to be epileptic and was on phenobarbital as a child. He has been off of medication seen in 2015 for similar seizure episode. He was seen by an neurologist negative MRI and EEG.Exam in emergency room noted a bite mark on the right mid tongue. Otherwise unremarkable.  Neurology appointment on 04/2013 noted  seizure activity likely from stress/insomnia. EEG normal. MRI normal. At that time, pt had urinary incontinence and was post-ictal. Otherwise unremarkable during that time.   MRI of the brain April 2015, personally reviewed images and agree with the following: Dedicated thin section imaging through the temporal lobes demonstrates normal volume and signal of the hippocampi. There is no evidence of heterotopia.  There is no acute infarct. Ventricles and sulci are normal for age. There is no evidence of intracranial hemorrhage, mass, midline shift, or extra-axial fluid collection. No brain parenchymal signal abnormality is identified. There is no abnormal enhancement.  Orbits are unremarkable. Paranasal sinuses and mastoid air cells are clear. Major intracranial vascular flow voids are preserved. Calvarium and scalp soft tissues are unremarkable.  IMPRESSION: Unremarkable brain MRI.  No etiology of seizures identified.  Review of Systems: Patient complains of symptoms per HPI as well as the following symptoms: Chills, fatigue, cough, diarrhea, seizure. Pertinent negatives per HPI. All others negative.   Social History   Social History  . Marital status: Married    Spouse name: Rosalita Chessman  . Number of children: 2  . Years of education: 14   Occupational History  . Engineer, structural     Social History Main Topics  . Smoking status: Never Smoker  . Smokeless tobacco: Former Neurosurgeon    Types: Chew    Quit date: 05/07/2004  . Alcohol use Yes     Comment: occ  . Drug use: No  . Sexual activity: Not on file  Other Topics Concern  . 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 Prostate cancer Father   . Colon cancer Paternal Grandmother   . Kidney disease Paternal Uncle      Past Medical History:  Diagnosis Date  . Anxiety   . Arthritis    BOTH HANDS  . BPH (benign prostatic hypertrophy)   . Elevated PSA   . HLD (hyperlipidemia)   . Hx MRSA infection 2009  . Kidney stone   . Nocturia   . Over weight   . Rotator cuff tear   . Seizures (HCC)     Past Surgical History:  Procedure Laterality Date  . COLONOSCOPY  2014?  . INGUINAL HERNIA REPAIR Left 02/17/2015   Procedure: LAPAROSCOPIC INGUINAL HERNIA;  Surgeon: Kieth Brightly, MD;  Location: [facility];  Service: General;  Laterality: Left;  . LITHOTRIPSY     x 2  . SHOULDER ARTHROSCOPY WITH OPEN ROTATOR CUFF REPAIR Left 09/23/2014   Procedure: SHOULDER ARTHROSCOPY , debridement, decompression, WITH  mini OPEN ROTATOR CUFF REPAIR;  Surgeon: Christena Flake, MD;  Location: [facility];  Service: Orthopedics;  Laterality: Left;  . SHOULDER SURGERY Bilateral    rotator cuff repair  . UMBILICAL HERNIA REPAIR N/A 02/17/2015   Procedure: LAPAROSCOPIC UMBILICAL HERNIA;  Surgeon: Kieth Brightly, MD;  Location: [facility];  Service: General;  Laterality: N/A;    Current Outpatient Prescriptions  Medication Sig Dispense Refill  . escitalopram (LEXAPRO) 10 MG tablet Take 1 tablet (10 mg total) by mouth daily. 90 tablet 3  . levETIRAcetam (KEPPRA) 500 MG tablet Take 1 tablet (500 mg total) by mouth 2 (two) times daily. 60 tablet 0   No current facility-administered medications for this visit.     Allergies as of 08/26/2015 - Review Complete 08/26/2015  Allergen Reaction Noted  . Penicillins Other (See Comments) 07/28/2007    Vitals: BP 127/76   Pulse (!) 102   Temp 100.2 F (37.9 C) (Oral)   Ht  (1.753 m)   Wt 216 lb 9.6 oz (98.2 kg)   BMI 31.99 kg/m  Last Weight:  Wt Readings from Last 1 Encounters:  08/26/15 216 lb 9.6 oz (98.2 kg)   Last Height:   Ht Readings from Last 1 Encounters:  08/26/15  (1.753 m)   Physical exam: Exam: Gen: NAD, conversant, well nourised, obese,  well groomed                     CV: RRR, no MRG. No Carotid Bruits. No peripheral edema, warm, nontender Eyes: Conjunctivae clear without exudates or hemorrhage  Neuro: Detailed Neurologic Exam  Speech:    Speech is normal; fluent and spontaneous with normal comprehension.  Cognition:    The patient is oriented to person, place, and time;     recent and remote memory intact;     language fluent;     normal attention, concentration,     fund of knowledge Cranial Nerves:    The pupils are equal, round, and reactive to light. The fundi are normal and spontaneous venous pulsations are present. Visual fields are full to finger confrontation. Extraocular movements are intact. Trigeminal sensation is intact and the muscles of mastication  are normal. The face is symmetric. The palate elevates in the midline. Hearing intact. Voice is normal. Shoulder shrug is normal. The tongue has normal motion without fasciculations.   Coordination:    Normal finger to nose and heel to shin. Normal rapid alternating movements.   Gait:    Heel-toe and tandem gait are normal.   Motor Observation:    No asymmetry, no atrophy, and no involuntary movements noted. Tone:    Normal muscle tone.    Posture:    Posture is normal. normal erect    Strength:    Strength is V/V in the upper and lower limbs.      Sensation: intact to LT     Reflex Exam:  DTR's:    Deep tendon reflexes in the upper and lower extremities are normal bilaterally.   Toes:    The toes are downgoing bilaterally.   Clonus:    Clonus is absent.       Assessment/Plan:  55 year old with a history of epilepsy and recent GTCS in the setting of fever.   EEG then 3-day EEG  Patient is unable to drive, operate heavy machinery, perform activities at heights or participate in water activities until 6 months seizure free  F/u 4 months.  Discussed Patients with epilepsy have a small risk of sudden unexpected death, a condition  referred to as sudden unexpected death in epilepsy (SUDEP). SUDEP is defined specifically as the sudden, unexpected, witnessed or unwitnessed, nontraumatic and nondrowning death in patients with epilepsy with or without evidence for a seizure, and excluding documented status epilepticus, in which post mortem examination does not reveal a structural or toxicologic cause for death   Continue Keppra. Discussed compliance. Patient has a fever that's unexplained, from the history it sounds as though the fever started before taking the Keppra however if no etiology is found may consider discontinuing Keppra and starting Depakote instead to rule out Keppra drug fever. Patient will call me after testing for fevers complete.   Naomie Dean, MD  [facility] [address] [place], [place] [address]  Phone [phone] Fax [phone]

## 2015-08-26 NOTE — Patient Instructions (Addendum)
Remember to drink plenty of fluid, eat healthy meals and do not skip any meals. Try to eat protein with a every meal and eat a healthy snack such as fruit or nuts in between meals. Try to keep a regular sleep-wake schedule and try to exercise daily, particularly in the form of walking, 20-30 minutes a day, if you can.   As far as your medications are concerned, I would like to suggest: Continue Keppra.  As far as diagnostic testing: EEG then 3-day EEG  I would like to see you back in 4 months, sooner if we need to. Please call us with any interim questions, concerns, problems, updates or refill requests.   Our phone number is [phone]. We also have an after hours call service for urgent matters and there is a physician on-call for urgent questions. For any emergencies you know to call 911 or go to the nearest emergency room  You are unable to drive, operate heavy machinery, perform activities at heights or participate in water activities until 6 months seizure free  Levetiracetam tablets What is this medicine? LEVETIRACETAM (lee ve tye RA se tam) is an antiepileptic drug. It is used with other medicines to treat certain types of seizures. This medicine may be used for other purposes; ask your health care provider or pharmacist if you have questions. What should I tell my health care provider before I take this medicine? They need to know if you have any of these conditions: -kidney disease -suicidal thoughts, plans, or attempt; a previous suicide attempt by you or a family member -an unusual or allergic reaction to levetiracetam, other medicines, foods, dyes, or preservatives -pregnant or trying to get pregnant -breast-feeding How should I use this medicine? Take this medicine by mouth with a glass of water. Follow the directions on the prescription label. Swallow the tablets whole. Do not crush or chew this medicine. You may take this medicine with or without food. Take your doses at  regular intervals. Do not take your medicine more often than directed. Do not stop taking this medicine or any of your seizure medicines unless instructed by your doctor or health care professional. Stopping your medicine suddenly can increase your seizures or their severity. A special MedGuide will be given to you by the pharmacist with each prescription and refill. Be sure to read this information carefully each time. Contact your pediatrician or health care professional regarding the use of this medication in children. While this drug may be prescribed for children as young as 52 years of age for selected conditions, precautions do apply. Overdosage: If you think you have taken too much of this medicine contact a poison control center or emergency room at once. NOTE: This medicine is only for you. Do not share this medicine with others. What if I miss a dose? If you miss a dose, take it as soon as you can. If it is almost time for your next dose, take only that dose. Do not take double or extra doses. What may interact with this medicine? This medicine may interact with the following medications: -carbamazepine -colesevelam -probenecid -sevelamer This list may not describe all possible interactions. Give your health care provider a list of all the medicines, herbs, non-prescription drugs, or dietary supplements you use. Also tell them if you smoke, drink alcohol, or use illegal drugs. Some items may interact with your medicine. What should I watch for while using this medicine? Visit your doctor or health care professional for a regular  check on your progress. Wear a medical identification bracelet or chain to say you have epilepsy, and carry a card that lists all your medications. It is important to take this medicine exactly as instructed by your health care professional. When first starting treatment, your dose may need to be adjusted. It may take weeks or months before your dose is stable. You  should contact your doctor or health care professional if your seizures get worse or if you have any new types of seizures. You may get drowsy or dizzy. Do not drive, use machinery, or do anything that needs mental alertness until you know how this medicine affects you. Do not stand or sit up quickly, especially if you are an older patient. This reduces the risk of dizzy or fainting spells. Alcohol may interfere with the effect of this medicine. Avoid alcoholic drinks. The use of this medicine may increase the chance of suicidal thoughts or actions. Pay special attention to how you are responding while on this medicine. Any worsening of mood, or thoughts of suicide or dying should be reported to your health care professional right away. Women who become pregnant while using this medicine may enroll in the [place] American Antiepileptic Drug Pregnancy Registry by calling [phone]. This registry collects information about the safety of antiepileptic drug use during pregnancy. What side effects may I notice from receiving this medicine? Side effects you should report to your doctor or health care professional as soon as possible: -allergic reactions like skin rash, itching or hives, swelling of the face, lips, or tongue -breathing problems -dark urine -general ill feeling or flu-like symptoms -problems with balance, talking, walking -unusually weak or tired -worsening of mood, thoughts or actions of suicide or dying -yellowing of the eyes or skin Side effects that usually do not require medical attention (report to your doctor or health care professional if they continue or are bothersome): -diarrhea -dizzy, drowsy -headache -loss of appetite This list may not describe all possible side effects. Call your doctor for medical advice about side effects. You may report side effects to FDA at 1-800-FDA-1088. Where should I keep my medicine? Keep out of reach of children. Store at room temperature  between 15 and 30 degrees C (59 and 86 degrees F). Throw away any unused medicine after the expiration date. NOTE: This sheet is a summary. It may not cover all possible information. If you have questions about this medicine, talk to your doctor, pharmacist, or health care provider.    2016, Elsevier/Gold Standard. (2012-12-09 08:42:48)

## 2015-08-26 NOTE — Telephone Encounter (Signed)
Spoken to patient's wife regarding Dr Lianne Bushy comments in results note. Notified her that still waiting for the lab results.

## 2015-08-26 NOTE — Telephone Encounter (Signed)
Patient's wife,Suzanne,called to let Dr.Duncan and Lugene know that Urgent Care will be faxing over patient's results.

## 2015-08-26 NOTE — Assessment & Plan Note (Signed)
Likely scarring or atelectasis on CXR.  Nontoxic at OV.  He'll check on outside labs, re: tick testing and update me.  Hold on abx for now.  Tongue lesion doesn't look infected.  No sign of ominous dx, ie meningitis.  D/w pt.   >25 minutes spent in face to face time with patient, >50% spent in counselling or coordination of care.  See AVS.

## 2015-08-29 ENCOUNTER — Emergency Department
Admission: EM | Admit: 2015-08-29 | Discharge: 2015-08-29 | Disposition: A | Discharge status: Home / Self Care | Payer: BLUE CROSS/BLUE SHIELD | Attending: Emergency Medicine | Admitting: Emergency Medicine

## 2015-08-29 ENCOUNTER — Emergency Department: Payer: BLUE CROSS/BLUE SHIELD

## 2015-08-29 ENCOUNTER — Other Ambulatory Visit: Payer: Self-pay | Admitting: Neurology

## 2015-08-29 ENCOUNTER — Encounter: Payer: Self-pay | Admitting: Emergency Medicine

## 2015-08-29 DIAGNOSIS — R911 Solitary pulmonary nodule: Secondary | ICD-10-CM | POA: Diagnosis not present

## 2015-08-29 DIAGNOSIS — Z720 Tobacco use: Secondary | ICD-10-CM | POA: Insufficient documentation

## 2015-08-29 DIAGNOSIS — R509 Fever, unspecified: Secondary | ICD-10-CM | POA: Insufficient documentation

## 2015-08-29 LAB — SALICYLATE LEVEL: Salicylate Lvl: <4 mg/dL (ref 2.8–30.0)

## 2015-08-29 LAB — URINALYSIS COMPLETE WITH MICROSCOPIC (ARMC ONLY)
BILIRUBIN URINE: NEGATIVE
Bacteria, UA: NONE SEEN
GLUCOSE, UA: NEGATIVE mg/dL
KETONES UR: NEGATIVE mg/dL
LEUKOCYTES UA: NEGATIVE
Nitrite: NEGATIVE
Protein, ur: 30 mg/dL — AB
RBC / HPF: NONE SEEN RBC/hpf (ref 0–5)
SQUAMOUS EPITHELIAL / LPF: NONE SEEN
Specific Gravity, Urine: 1.024 (ref 1.005–1.030)
pH: 5 (ref 5.0–8.0)

## 2015-08-29 LAB — COMPREHENSIVE METABOLIC PANEL
ALBUMIN: 3.3 g/dL — AB (ref 3.5–5.0)
ALK PHOS: 73 U/L (ref 38–126)
ALT: 27 U/L (ref 17–63)
ANION GAP: 8 (ref 5–15)
AST: 31 U/L (ref 15–41)
BILIRUBIN TOTAL: 0.8 mg/dL (ref 0.3–1.2)
BUN: 15 mg/dL (ref 6–20)
CALCIUM: 8.7 mg/dL — AB (ref 8.9–10.3)
CO2: 24 mmol/L (ref 22–32)
Chloride: 101 mmol/L (ref 101–111)
Creatinine, Ser: 1.02 mg/dL (ref 0.61–1.24)
GLUCOSE: 120 mg/dL — AB (ref 65–99)
POTASSIUM: 3.5 mmol/L (ref 3.5–5.1)
Sodium: 133 mmol/L — ABNORMAL LOW (ref 135–145)
TOTAL PROTEIN: 6.8 g/dL (ref 6.5–8.1)

## 2015-08-29 LAB — CBC WITH DIFFERENTIAL/PLATELET
Basophils Absolute: 0.2 10*3/uL — ABNORMAL HIGH (ref 0–0.1)
Basophils Relative: 3 %
Eosinophils Absolute: 0 10*3/uL (ref 0–0.7)
Eosinophils Relative: 0 %
HEMATOCRIT: 36.8 % — AB (ref 40.0–52.0)
HEMOGLOBIN: 12.9 g/dL — AB (ref 13.0–18.0)
LYMPHS ABS: 1.5 10*3/uL (ref 1.0–3.6)
LYMPHS PCT: 21 %
MCH: 30 pg (ref 26.0–34.0)
MCHC: 35.2 g/dL (ref 32.0–36.0)
MCV: 85.4 fL (ref 80.0–100.0)
MONO ABS: 1.2 10*3/uL — AB (ref 0.2–1.0)
MONOS PCT: 16 %
NEUTROS ABS: 4.4 10*3/uL (ref 1.4–6.5)
NEUTROS PCT: 60 %
Platelets: 188 10*3/uL (ref 150–440)
RBC: 4.31 MIL/uL — ABNORMAL LOW (ref 4.40–5.90)
RDW: 13.4 % (ref 11.5–14.5)
WBC: 7.4 10*3/uL (ref 3.8–10.6)

## 2015-08-29 MED ORDER — DIVALPROEX SODIUM ER 500 MG PO TB24
500.0000 mg | ORAL_TABLET | Freq: Once | ORAL | Status: AC
  Administered 2015-08-29: 500 mg via ORAL
  Filled 2015-08-29: qty 1

## 2015-08-29 MED ORDER — SODIUM CHLORIDE 0.9 % IV BOLUS (SEPSIS)
1000.0000 mL | Freq: Once | INTRAVENOUS | Status: AC
Start: 2015-08-29 — End: 2015-08-29
  Administered 2015-08-29: 1000 mL via INTRAVENOUS

## 2015-08-29 MED ORDER — DIVALPROEX SODIUM ER 500 MG PO TB24: 1000.0000 mg | ORAL_TABLET | Freq: Two times a day (BID) | ORAL | 11 refills | Status: DC

## 2015-08-29 MED ORDER — IOPAMIDOL (ISOVUE-300) INJECTION 61%
75.0000 mL | Freq: Once | INTRAVENOUS | Status: AC | PRN
  Administered 2015-08-29: 75 mL via INTRAVENOUS

## 2015-08-29 MED ORDER — DOXYCYCLINE HYCLATE 100 MG PO CAPS: 100.0000 mg | ORAL_CAPSULE | Freq: Two times a day (BID) | ORAL | 0 refills | Status: AC

## 2015-08-29 NOTE — Telephone Encounter (Signed)
Please call pt.   I called the neuro clinic in the meantime to talk to the on call doc.  I haven't heard back yet.  I had routed the note this AM.   I would expect neuro to have him stop the keppra and start depakote, based on prev note.   I would skip the keppra tonight and tomorrow AM.   I'll route this to neuro in the meantime for input.   I need input from neuro about any possible taper off keppra, when to start depakote, what dose to take, etc, and have neuro communicate that to the patient.   Thanks.

## 2015-08-29 NOTE — ED Notes (Signed)

## 2015-08-29 NOTE — Telephone Encounter (Signed)
I need neuro input.   Last note mentioned possible fever from keppra with the possible plan to change meds . I don't see a source for the fever otherwise and I need input on med change from neuro.   Last portion of neuro note pasted below.  ----------------- Continue Keppra. Discussed compliance. Patient has a fever that's unexplained, from the history it sounds as though the fever started before taking the Keppra however if no etiology is found may consider discontinuing Keppra and starting Depakote instead to rule out Keppra drug fever. Patient will call me after testing for fevers complete. -----------------

## 2015-08-29 NOTE — ED Provider Notes (Signed)
[facility] Emergency Department Provider Note  ____________________________________________  Time seen: Approximately 6:38 PM  I have reviewed the triage vital signs and the nursing notes.   HISTORY  Chief Complaint Fever   HPI Mark Macnab. is a 55 y.o. male no significant past medical history who presents for evaluation of fever. Patient reports 12 days of persistent fevers as high as 103F. patient was seen here on 08/16/15 with a seizure. At that time patient had a negative workup and he was started on Keppra. He has seen his primary care doctor for this fever 4 days ago and he was told that it could be a side effect of the Keppra. Patient reports that he continues to have the fever daily. He has been taking 600 mg of ibuprofen 4-5 times a day to keep his fever in check. Patient reports weight loss and nightsweats. He also reports that he was placed on bactrim for tongue trauma sustained during the seizure as his PCP was concerned it could be the cause of his fever and reports that during the 5 days he was on bactrim the fever was milder and only present at nigh time. Patient also reports multiple tick bites of the last one 2 weeks ago but no rash. He reports a mild headache when he has the fever but that resolves when he defervesces. He denies neck stiffness. Also reports a dry cough that has been going on for about 2 weeks but no chest pain or shortness of breath. Had a chest x-ray done 4 days ago that showed some lung scarring but no infection. 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 No recent travel. He denies travle to TB endemic regions but has had enviromental exposures on his job. Denies incarceration.  He also reports nausea but denies abdominal pain, dysuria, diarrhea.  Past Medical History:  Diagnosis Date  . Anxiety   . Arthritis    BOTH HANDS  . BPH (benign prostatic hypertrophy)   . Elevated PSA   . HLD  (hyperlipidemia)   . Hx MRSA infection 2009  . Kidney stone   . Nocturia   . Over weight   . Rotator cuff tear   . Seizures [facility])     Patient Active Problem List   Diagnosis Date Noted  . Fever, unspecified 08/26/2015  . BPH with obstruction/lower urinary tract symptoms 03/18/2015  . Nocturia 03/18/2015  . Family history of prostate cancer 03/18/2015  . Routine general medical examination at a health care facility 12/31/2014  . Advance care planning 12/31/2014  . Work-related stress 09/08/2013  . Injury of left shoulder 05/05/2013  . Syncope and collapse 05/04/2013    Past Surgical History:  Procedure Laterality Date  . COLONOSCOPY  2014?  . INGUINAL HERNIA REPAIR Left 02/17/2015   Procedure: LAPAROSCOPIC INGUINAL HERNIA;  Surgeon: Kieth Brightly, MD;  Location: [facility] ORS;  Service: General;  Laterality: Left;  . LITHOTRIPSY     x 2  . SHOULDER ARTHROSCOPY WITH OPEN ROTATOR CUFF REPAIR Left 09/23/2014   Procedure: SHOULDER ARTHROSCOPY , debridement, decompression, WITH  mini OPEN ROTATOR CUFF REPAIR;  Surgeon: Christena Flake, MD;  Location: [facility] ORS;  Service: Orthopedics;  Laterality: Left;  . SHOULDER SURGERY Bilateral    rotator cuff repair  . UMBILICAL HERNIA REPAIR N/A 02/17/2015   Procedure: LAPAROSCOPIC UMBILICAL HERNIA;  Surgeon: Kieth Brightly, MD;  Location: [facility] ORS;  Service: General;  Laterality: N/A;  Prior to Admission medications   Medication Sig Start Date End Date Taking? Authorizing Provider  escitalopram (LEXAPRO) 10 MG tablet Take 1 tablet (10 mg total) by mouth daily. 12/31/14  Yes Joaquim Nam, MD  ibuprofen (ADVIL,MOTRIN) 200 MG tablet Take 400 mg by mouth every 6 (six) hours as needed.   Yes Historical Provider, MD  levETIRAcetam (KEPPRA) 500 MG tablet Take 1 tablet (500 mg total) by mouth 2 (two) times daily. 08/17/15  Yes Audry Pili, PA-C  doxycycline (VIBRAMYCIN) 100 MG capsule Take 1 capsule (100 mg total) by mouth 2 (two) times daily.  08/29/15 09/05/15  Nita Sickle, MD    Allergies Penicillins  Family History  Problem Relation Age of Onset  . Prostate cancer Father   . Colon cancer Paternal Grandmother   . Kidney disease Paternal Uncle     Social History Social History  Substance Use Topics  . Smoking status: Never Smoker  . Smokeless tobacco: Former Neurosurgeon    Types: Chew    Quit date: 05/07/2004  . Alcohol use Yes     Comment: occ    Review of Systems Constitutional: + fever, weight loss, nightsweats Eyes: Negative for visual changes. ENT: Negative for sore throat. Cardiovascular: Negative for chest pain. Respiratory: Negative for shortness of breath. + cough Gastrointestinal: Negative for abdominal pain, vomiting or diarrhea. + nausea Genitourinary: Negative for dysuria. Musculoskeletal: Negative for back pain. Skin: Negative for rash. Neurological: Negative for headaches, weakness or numbness.  ____________________________________________   PHYSICAL EXAM:  VITAL SIGNS: ED Triage Vitals  Enc Vitals Group     BP 08/29/15 1813 135/74     Pulse Rate 08/29/15 1813 (!) 106     Resp 08/29/15 1813 18     Temp 08/29/15 1813 100.3 F (37.9 C)     Temp Source 08/29/15 1813 Oral     SpO2 --      Weight 08/29/15 1813 220 lb (99.8 kg)     Height 08/29/15 1813  (1.753 m)     Head Circumference --      Peak Flow --      Pain Score 08/29/15 1815 0     Pain Loc --      Pain Edu? --      Excl. in GC? --     Constitutional: Alert and oriented. Well appearing and in no apparent distress. HEENT:      Head: Normocephalic and atraumatic.         Eyes: Conjunctivae are normal. Sclera is non-icteric. EOMI. PERRL      Mouth/Throat: Mucous membranes are moist.       Neck: Supple with no signs of meningismus. Cardiovascular: Regular rate and rhythm. No murmurs, gallops, or rubs. 2+ symmetrical distal pulses are present in all extremities. No JVD. Respiratory: Normal respiratory effort. Lungs are clear  to auscultation bilaterally. No wheezes, crackles, or rhonchi.  Gastrointestinal: Soft, non tender, and non distended with positive bowel sounds. No rebound or guarding. Genitourinary: No CVA tenderness. Musculoskeletal: Nontender with normal range of motion in all extremities. No edema, cyanosis, or erythema of extremities. Neurologic: Normal speech and language. Face is symmetric. Moving all extremities. No gross focal neurologic deficits are appreciated. Skin: Skin is warm, dry and intact. No rash noted. Psychiatric: Mood and affect are normal. Speech and behavior are normal.  ____________________________________________   LABS (all labs ordered are listed, but only abnormal results are displayed)  Labs Reviewed  CBC WITH DIFFERENTIAL/PLATELET - Abnormal; Notable for the following:  Result Value   RBC 4.31 (*)    Hemoglobin 12.9 (*)    HCT 36.8 (*)    Monocytes Absolute 1.2 (*)    Basophils Absolute 0.2 (*)    All other components within normal limits  COMPREHENSIVE METABOLIC PANEL - Abnormal; Notable for the following:    Sodium 133 (*)    Glucose, Bld 120 (*)    Calcium 8.7 (*)    Albumin 3.3 (*)    All other components within normal limits  URINALYSIS COMPLETEWITH MICROSCOPIC ([facility] ONLY) - Abnormal; Notable for the following:    Color, Urine YELLOW (*)    APPearance CLEAR (*)    Hgb urine dipstick 1+ (*)    Protein, ur 30 (*)    All other components within normal limits  CULTURE, BLOOD (ROUTINE X 2)  CULTURE, BLOOD (ROUTINE X 2)  URINE CULTURE  SALICYLATE LEVEL  ROCKY MTN SPOTTED FVR ABS PNL(IGG+IGM)   ____________________________________________  EKG  none ____________________________________________  RADIOLOGY  CT chest: Two small right lung pulmonary nodules. NO acute disease  ____________________________________________   PROCEDURES  Procedure(s) performed: None Procedures Critical Care performed:   None ____________________________________________   INITIAL IMPRESSION / ASSESSMENT AND PLAN / ED COURSE  55 year old male with no significant past medical history who presents for evaluation of daily fever for 12 days, night sweats, loss, and dry cough. Patient started on keppra right before the fever started. Has had multiple environmental exposures but never been incarcerated or areas of endemic TB. Has had multiple tick bites but no rash. Patient is extremely well-appearing, in no distress, his vital signs show mild tachycardia with pulse of 106 and a low grade temperature of 100.31F, patient has no meningismus, no signs of meningitis, extremely well-appearing, no rash, lungs are clear, abdomen is soft.  Plan for CBC, CMP, blood cultures, CT chest, UA, urine culture, and RMSF titers.  His PCP thinks this is a side effect of the keppra. Fortunately no temperature was documented when patient initially presented with seizures certainly unclear if the fever started before or after Keppra was initiated. Also with the fact the patient reports improvement of the fever when he was on Bactrim makes me more concerned that this is an infectious etiology.  Clinical Course  Comment By Time  White count normal, UA normal, patient remains afebrile here and extremely well-appearing with no signs or symptoms of meningitis. CT scan showing an incidental finding of 2 small lung nodules. I discussed those findings with patient and his wife and recommended follow-up with his primary care doctor in 6 months. Patient's neurologist has contacted them while they were in the emergency department and recommended patient stop Ran*Depakote. A prescription was sent to the pharmacy. We'll start patient this evening on the new dose of Depakote here. We'll also provide patient with a prescription for doxycycline in case the fever returns on the new medication, so patient can take for possible tick borne illness. Patient and wife  are comfortable with this plan. Nita Sickle, MD 07/31 2111    Pertinent labs & imaging results that were available during my care of the patient were reviewed by me and considered in my medical decision making (see chart for details).   I discussed my evaluation of the patient's symptoms, my clinical impression, and my proposed outpatient treatment plan with patient/ family members. We have discussed anticipatory guidance, scheduled follow-up, and careful return precautions. The patient expresses understanding and is comfortable with the discharge plan. All patient's questions were  answered.    ____________________________________________   FINAL CLINICAL IMPRESSION(S) / ED DIAGNOSES  Final diagnoses:  Fever, unspecified fever cause      NEW MEDICATIONS STARTED DURING THIS VISIT:  New Prescriptions   DOXYCYCLINE (VIBRAMYCIN) 100 MG CAPSULE    Take 1 capsule (100 mg total) by mouth 2 (two) times daily.     Note:  This document was prepared using Dragon voice recognition software and may include unintentional dictation errors.    Nita Sickle, MD 08/29/15 2118

## 2015-08-29 NOTE — Discharge Instructions (Signed)
Stop keppra and start Depakote 500 twice a day as recommended by your doctor. You received your evening dose here. If the fever is still present after 24 hours off of Keppra start taking doxycycline twice a day for 7 days for a possible tickborne illness. Return to the emergency department if you have a severe headache, neck stiffness, rash, abdominal pain, or any other symptoms concerning to you. As I explained to you there was an incidental finding of 2 nodules in the right lung and you must follow up with your primary care doctor within 6-12 months for a repeat CT scan to make sure this is not cancer.

## 2015-08-29 NOTE — Telephone Encounter (Signed)
Patient advised.   Patient states he just walked into the ER.

## 2015-08-29 NOTE — Telephone Encounter (Signed)
Dr. Para March, I left a message for patient on his cell phone. Tried the home phone too but just rang. He can stop the Keppra when he starts the Depakote. I'll call it in tonight. I will start Depakote 500mg  ER bid then in one week increase to 1000mg  bid thanks  Discussed side effects at last appointment.  Kara Mead, will you call tomorrow and review again the side effects please and ensure he received the message? Will start Depakote. Common reactions can include headache, nausea vomiting, somnolence, thrombocytopenia, dyspepsia, dizziness, diarrhea, abdominal pain, tremor, alopecia, weight changes, appetite changes, constipation and other side effects. Please can stop for anything concerning. Rare but serious side effects include hepatotoxicity, pancreatitis, hyponatremia, pancytopenia, thrombocytopenia and electrolyte imbalances. He is to stop for any concerning symptoms and follow up with me in clinic.

## 2015-08-29 NOTE — Telephone Encounter (Signed)
Wife Janina Mayo, wanted to know if we had received the results on Friday 08/26/15.   Explained we are having phone and fax difficulties, will request from urgent care that the results be faxed to a working fax machine in our office. Per spouse we need lab and ov notes from [facility] in [place] from 08/20/15.  Best number to call pt is [phone]

## 2015-08-29 NOTE — Telephone Encounter (Signed)
Pt Left message on Triage VM and states he has had high fever for 10-12 days.  Requesting callback from [facility] or PCP.  Best number [phone]

## 2015-08-29 NOTE — Telephone Encounter (Signed)
I'll await the ER notes.  I appreciate neuro input.

## 2015-08-29 NOTE — Telephone Encounter (Signed)
Labs received and Dr. Para March reviewed.  Dr Para March says:  Notify patient that labs are okay.  Does the patient still have fever?  If so, notify neuro re: possible med fever.  If other symptoms, then notify me.  Patient advised.  Patient still has fever, no other symptoms except some cough.  Patient stated that neuro tells him to call PCP because this is out of their realm.

## 2015-08-29 NOTE — ED Triage Notes (Signed)
Reports fever off and on for 2 wks.  Denies other sx.

## 2015-08-30 ENCOUNTER — Encounter: Payer: Self-pay | Admitting: *Deleted

## 2015-08-30 NOTE — Patient Instructions (Signed)
Faxed printed rx depakote 500mg  tab to pt pharmacy. Fax: [phone]. Received confirmation.

## 2015-08-30 NOTE — Telephone Encounter (Signed)
Pt seen in ED  yesterday

## 2015-08-31 LAB — URINE CULTURE: CULTURE: NO GROWTH

## 2015-09-01 ENCOUNTER — Telehealth: Payer: Self-pay | Admitting: Family Medicine

## 2015-09-01 NOTE — Telephone Encounter (Signed)
Pt dropped off forms that need to be filled out before returning back to work. Best number is [phone]. Placing in rx tower. Thanks

## 2015-09-01 NOTE — Telephone Encounter (Signed)
I'll work on the hard copy.  Thanks.  

## 2015-09-03 LAB — CULTURE, BLOOD (ROUTINE X 2)
CULTURE: NO GROWTH
CULTURE: NO GROWTH

## 2015-09-03 LAB — ROCKY MTN SPOTTED FVR ABS PNL(IGG+IGM)
RMSF IGM: 0.28 {index} (ref 0.00–0.89)
RMSF IgG: UNDETERMINED

## 2015-09-03 LAB — RMSF, IGG, IFA: RMSF, IGG, IFA: 1:64 {titer} — ABNORMAL HIGH

## 2015-09-05 ENCOUNTER — Telehealth: Payer: Self-pay | Admitting: Emergency Medicine

## 2015-09-05 DIAGNOSIS — Z7689 Persons encountering health services in other specified circumstances: Secondary | ICD-10-CM

## 2015-09-05 NOTE — Telephone Encounter (Signed)
Patient notified of [facility] RMSF lab results.  Patient received prescription for Doxycycline which he states that he completed yesterday.  Advised to follow up with PCP Dr. Para Marchuncan.

## 2015-09-05 NOTE — Telephone Encounter (Signed)
Patient advised that ppw is ready for pick up and placed at the front desk.  Copy sent for scanning and for billing with [facility].

## 2015-09-07 ENCOUNTER — Telehealth: Payer: Self-pay | Admitting: Neurology

## 2015-09-07 NOTE — Telephone Encounter (Signed)
Per Dr Lucia GaskinsAhern- need to find out if pt still has fever, or if that resolved after stopping keppra.

## 2015-09-07 NOTE — Telephone Encounter (Signed)
Patient called to advise he is having a problem with medication Divalproex Sodium ER 500 mg, "has worst case of acne he's ever had in his life". Please call [phone].

## 2015-09-07 NOTE — Telephone Encounter (Signed)
Called patient back. Went to VM but VM full. Will try again later.

## 2015-09-07 NOTE — Telephone Encounter (Signed)
Dr Lucia GaskinsAhern- please advise  Pt called back and stated his fever resolved after stopping keppra. He took doxycycline for 3 days. Fever went away.   Acne started right after the fever a little over a week ago. Just on face all over. Been getting worse since a week ago. He has been off antibiotics since this past Sunday morning. He is not sure if it was the antibiotics or not. He stated he looked up SE from Depakote and he saw ance was a SE. He is wanting to know what he should do from here. Advised I will send message to Dr Lucia Gaskinsahern and call back to advise. He verbalized understanding.

## 2015-09-08 ENCOUNTER — Other Ambulatory Visit: Payer: Self-pay | Admitting: Neurology

## 2015-09-08 ENCOUNTER — Telehealth: Payer: Self-pay | Admitting: *Deleted

## 2015-09-08 MED ORDER — LACOSAMIDE 50 MG PO TABS: ORAL_TABLET | ORAL | 12 refills | Status: DC

## 2015-09-08 NOTE — Telephone Encounter (Signed)
Dr Ahern- please advise 

## 2015-09-08 NOTE — Telephone Encounter (Signed)
Did you get these papers?

## 2015-09-08 NOTE — Telephone Encounter (Signed)
Patient is calling back asking what medication should he take? Please call and discuss.

## 2015-09-08 NOTE — Telephone Encounter (Signed)
[name] came in to drop off some long term disability forms to be filled out. Please let him know when they are ready. Cell [phone] or home [phone]. Form placed in prescription tower.

## 2015-09-08 NOTE — Telephone Encounter (Signed)
Placed in Dr, Lianne Bushyuncan's in-basket. Will let pt when ready.

## 2015-09-08 NOTE — Telephone Encounter (Signed)
I had done them prev.  See the phone call thread from 09/01/15.  [facility]ene put them up front, per her note.  Thanks.

## 2015-09-08 NOTE — Telephone Encounter (Signed)
Called pt back. Relayed Dr Trevor MaceAhern's message below. He verbalized understanding.   He said best number to call is: [phone]

## 2015-09-08 NOTE — Telephone Encounter (Signed)
Mark Hernandez, let them know I will call them back and we can discuss other options. I wil lcall back as soon as I am done patients thanks. Get a number where they will be at 5pm thanks

## 2015-09-08 NOTE — Telephone Encounter (Signed)
Spoke to patient will start vimpat instead.

## 2015-09-09 NOTE — Telephone Encounter (Signed)
Wife called to check status of lacosamide (VIMPAT) 50 MG TABS tablet to [facility] [address] [place].

## 2015-09-09 NOTE — Telephone Encounter (Signed)
Spoke to pt and relayed that prescription faxed in to [facility].  He stated understanding.

## 2015-09-11 ENCOUNTER — Encounter: Payer: Self-pay | Admitting: Family Medicine

## 2015-09-22 ENCOUNTER — Ambulatory Visit (INDEPENDENT_AMBULATORY_CARE_PROVIDER_SITE_OTHER): Payer: BLUE CROSS/BLUE SHIELD | Admitting: Neurology

## 2015-09-22 DIAGNOSIS — G40309 Generalized idiopathic epilepsy and epileptic syndromes, not intractable, without status epilepticus: Secondary | ICD-10-CM

## 2015-09-22 DIAGNOSIS — R569 Unspecified convulsions: Secondary | ICD-10-CM

## 2015-09-22 DIAGNOSIS — G40A09 Absence epileptic syndrome, not intractable, without status epilepticus: Secondary | ICD-10-CM

## 2015-09-22 NOTE — Procedures (Signed)
    History:  Mark Hernandez is a 55 year old patient with a history of syncope and collapse, nocturia, and a history of epilepsy. The patient has recently had an episode while watching TV where he began breathing heavy, he started jerking on the entire body for 1 minute. This was associated with tongue biting and confusion afterwards. The patient is being evaluated for a possible seizure.  This is a routine EEG. No skull defects are noted. Medications include Lexapro and Keppra.   EEG classification: Normal awake  Description of the recording: The background rhythms of this recording consists of a fairly well modulated medium amplitude alpha rhythm of 9 Hz that is reactive to eye opening and closure. As the record progresses, the patient appears to remain in the waking state throughout the recording. Photic stimulation was performed, resulting in a bilateral and symmetric photic driving response. Hyperventilation was also performed, resulting in a minimal buildup of the background rhythm activities without significant slowing seen. At no time during the recording does there appear to be evidence of spike or spike wave discharges or evidence of focal slowing. EKG monitor shows no evidence of cardiac rhythm abnormalities with a heart rate of 66.  Impression: This is a normal EEG recording in the waking state. No evidence of ictal or interictal discharges are seen.

## 2015-09-23 ENCOUNTER — Telehealth: Payer: Self-pay | Admitting: *Deleted

## 2015-09-23 NOTE — Telephone Encounter (Signed)
Unable to reach patient on second attempt

## 2015-09-23 NOTE — Telephone Encounter (Signed)
Attempted to reach pt re: EEG results. No answer and voice mailbox not set up. Will try again later.

## 2015-09-28 ENCOUNTER — Telehealth: Payer: Self-pay | Admitting: *Deleted

## 2015-09-28 NOTE — Telephone Encounter (Signed)
Attempted to reach patient with number he provided. Person who answered phone stated this caller had reached the wrong number.

## 2015-09-28 NOTE — Telephone Encounter (Signed)
Attempted to reach patient a second time re: EEG results. No answer, voice mailbox not set up.

## 2015-09-28 NOTE — Telephone Encounter (Signed)
Per Dr Lucia GaskinsAhern, spoke with patient and informed him that his EEG results are normal. He stated "That is good news." he verbalized understanding, appreciation for call.

## 2015-09-28 NOTE — Telephone Encounter (Signed)
Pt returned RN's call. He said he can be reached at [phone]

## 2015-09-28 NOTE — Telephone Encounter (Signed)
[facility] call, please call [phone].

## 2015-11-09 ENCOUNTER — Other Ambulatory Visit: Payer: Self-pay | Admitting: Family Medicine

## 2015-11-09 NOTE — Telephone Encounter (Signed)
Last filled on 12/31/14, last OV 08/25/15. Ok to refill?

## 2015-11-10 NOTE — Telephone Encounter (Signed)
Sent. Thanks.   

## 2015-11-29 ENCOUNTER — Encounter: Payer: Self-pay | Admitting: Neurology

## 2015-11-29 ENCOUNTER — Ambulatory Visit (INDEPENDENT_AMBULATORY_CARE_PROVIDER_SITE_OTHER): Payer: BLUE CROSS/BLUE SHIELD | Admitting: Neurology

## 2015-11-29 VITALS — BP 130/79 | HR 91 | Ht 69.0 in | Wt 227.0 lb

## 2015-11-29 DIAGNOSIS — G40309 Generalized idiopathic epilepsy and epileptic syndromes, not intractable, without status epilepticus: Secondary | ICD-10-CM

## 2015-11-29 DIAGNOSIS — Z79899 Other long term (current) drug therapy: Secondary | ICD-10-CM | POA: Diagnosis not present

## 2015-11-29 MED ORDER — LACOSAMIDE 150 MG PO TABS: 150.0000 mg | ORAL_TABLET | Freq: Two times a day (BID) | ORAL | 4 refills | Status: DC | PRN

## 2015-11-29 NOTE — Patient Instructions (Signed)
Overall you are doing fairly well but I do want to suggest a few things today:   Remember to drink plenty of fluid, eat healthy meals and do not skip any meals. Try to eat protein with a every meal and eat a healthy snack such as fruit or nuts in between meals. Try to keep a regular sleep-wake schedule and try to exercise daily, particularly in the form of walking, 20-30 minutes a day, if you can.   As far as your medications are concerned, I would like to suggest: Continue Vimpat   I would like to see you back in one year, sooner if we need to. Please call us with any interim questions, concerns, problems, updates or refill requests.   Please also call us for any test results so we can go over those with you on the phone.  My clinical assistant and will answer any of your questions and relay your messages to me and also relay most of my messages to you.   Our phone number is [phone]. We also have an after hours call service for urgent matters and there is a physician on-call for urgent questions. For any emergencies you know to call 911 or go to the nearest emergency room

## 2015-11-29 NOTE — Progress Notes (Signed)
[facility]    Provider:  Dr Lucia GaskinsAhern Referring Provider: Joaquim Namuncan, Graham S, MD Primary Care Physician:  Crawford GivensGraham Duncan, MD   CC:  seizures  Interval history: Patient is doing well on Vimpat. He did not tolerate Kappra or Depakote. He pays $40 a month for the medication. EEG was normal. No recurrent seizures. Discussed seizures, seizure precautions.  HPI:  Mark Connersony G Giaimo Jr. is a 55 y.o. male here as a referral from Dr. Para Marchuncan for seizures. Past medical history of syncope and collapse, nocturia, BPH, work-related stress and epilepsy. Last Tuesday they were watching TV and he started breathing heavy and he started jerking whole body for one minute in the setting of illness and fever, bit tongue, eyes open, confused afterwards. Speech was slurred. Blood coming out of mouth. Wife provides information. He had staring spells as a child and he was diagnosed with epilepsy, absence and was started on phenobarb at the age of 55 was on it for 20 years. He has been off of the medicine for 20 years. In 2015 he had an episode of vertigo, he was under stress, then he was "out" no one witnessed it, he was found confused, he bit his tongue. He had been outside the whole day before this recent event, he has a fever. No family history of seizures. No other focal neurologic deficits or complaints.  Reviewed notes, labs and imaging from outside physicians, which showed:  CBC was normal, CMP was unremarkable labs 08/17/2015.  Patient was seen in the emergency room 08/16/2015. Complained of seizure activity for 1 hour. He was somewhat she TV when he lost consciousness. Per wife he was lying in bed with her when he started breathing rapidly and she noticed tonic-clonic jerking with eyes fixed. Lasted a few minutes. Postictal confusion. Patient bit his tongue. No urinary incontinence. Patient confessed that he used to be epileptic and was on phenobarbital as a child. He has been off of medication seen  in 2015 for similar seizure episode. He was seen by an neurologist negative MRI and EEG.Exam in emergency room noted a bite Mark on the right mid tongue. Otherwise unremarkable.  Neurology appointment on 04/2013 noted seizure activity likely from stress/insomnia. EEG normal. MRI normal. At that time, pt had urinary incontinence and was post-ictal. Otherwise unremarkable during that time.   MRI of the brain April 2015, personally reviewed images and agree with the following: Dedicated thin section imaging through the temporal lobes demonstrates normal volume and signal of the hippocampi. There is no evidence of heterotopia.  There is no acute infarct. Ventricles and sulci are normal for age. There is no evidence of intracranial hemorrhage, mass, midline shift, or extra-axial fluid collection. No brain parenchymal signal abnormality is identified. There is no abnormal enhancement.  Orbits are unremarkable. Paranasal sinuses and mastoid air cells are clear. Major intracranial vascular flow voids are preserved. Calvarium and scalp soft tissues are unremarkable.  IMPRESSION: Unremarkable brain MRI. No etiology of seizures identified.  Review of Systems: Patient complains of symptoms per HPI as well as the following symptoms: Chills, fatigue, cough, diarrhea, seizure. Pertinent negatives per HPI. All others negative.   Social History   Social History  . Marital status: Married    Spouse name: Mark Hernandez  . Number of children: 2  . Years of education: 14   Occupational History  . Engineer, structuralire Chief     Social History Main Topics  . Smoking status: Never Smoker  . Smokeless tobacco: Former NeurosurgeonUser  Types: Dorna BloomChew    Quit date: 05/07/2004  . Alcohol use Yes     Comment: occ  . Drug use: No  . Sexual activity: Not on file   Other Topics Concern  . 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 Prostate cancer Father   . Colon cancer Paternal Grandmother   . Kidney disease Paternal Uncle     Past Medical History:  Diagnosis Date  . Anxiety   . Arthritis    BOTH HANDS  . BPH (benign prostatic hypertrophy)   . Elevated PSA   . HLD (hyperlipidemia)   . Hx MRSA infection 2009  . Kidney stone   . Nocturia   . Over weight   . Rotator cuff tear   . Seizures (HCC)     Past Surgical History:  Procedure Laterality Date  . COLONOSCOPY  2014?  . INGUINAL HERNIA REPAIR Left 02/17/2015   Procedure: LAPAROSCOPIC INGUINAL HERNIA;  Surgeon: Kieth BrightlySeeplaputhur G Sankar, MD;  Location: [facility];  Service: General;  Laterality: Left;  . LITHOTRIPSY     x 2  . SHOULDER ARTHROSCOPY WITH OPEN ROTATOR CUFF REPAIR Left 09/23/2014   Procedure: SHOULDER ARTHROSCOPY , debridement, decompression, WITH  mini OPEN ROTATOR CUFF REPAIR;  Surgeon: Christena FlakeJohn J Poggi, MD;  Location: [facility];  Service: Orthopedics;  Laterality: Left;  . SHOULDER SURGERY Bilateral    rotator cuff repair  . UMBILICAL HERNIA REPAIR N/A 02/17/2015   Procedure: LAPAROSCOPIC UMBILICAL HERNIA;  Surgeon: Kieth BrightlySeeplaputhur G Sankar, MD;  Location: [facility];  Service: General;  Laterality: N/A;    Current Outpatient Prescriptions  Medication Sig Dispense Refill  . escitalopram (LEXAPRO) 10 MG tablet TAKE 1 TABLET BY MOUTH ONCE A DAY 90 tablet 1  . ibuprofen (ADVIL,MOTRIN) 200 MG tablet Take 400 mg by mouth every 6 (six) hours as needed.    . Lacosamide 150 MG TABS Take 1 tablet (150 mg total) by mouth 2 (two) times daily as needed. 180 tablet 4   No current facility-administered medications for this visit.     Allergies as of 11/29/2015 - Review Complete 11/29/2015  Allergen Reaction Noted  . Penicillins Other (See Comments) 07/28/2007    Vitals: BP 130/79 (BP Location: Right Arm, Patient Position: Sitting, Cuff Size:  Normal)   Pulse 91   Ht 5\' 9"  (1.753 m)   Wt 227 lb (103 kg)   BMI 33.52 kg/m  Last Weight:  Wt Readings from Last 1 Encounters:  11/29/15 227 lb (103 kg)   Last Height:   Ht Readings from Last 1 Encounters:  11/29/15 5\' 9"  (1.753 m)     Physical exam: Exam: Gen: NAD, conversant, well nourised, obese, well groomed                     CV: RRR, no MRG. No Carotid Bruits. No peripheral edema, warm, nontender Eyes: Conjunctivae clear without exudates or hemorrhage  Neuro: Detailed Neurologic Exam  Speech:    Speech is normal; fluent and spontaneous with normal comprehension.  Cognition:    The patient is oriented to person, place, and time;     recent and remote memory intact;     language fluent;     normal attention,  concentration,     fund of knowledge Cranial Nerves:    The pupils are equal, round, and reactive to light. The fundi are normal and spontaneous venous pulsations are present. Visual fields are full to finger confrontation. Extraocular movements are intact. Trigeminal sensation is intact and the muscles of mastication are normal. The face is symmetric. The palate elevates in the midline. Hearing intact. Voice is normal. Shoulder shrug is normal. The tongue has normal motion without fasciculations.   Coordination:    Normal finger to nose and heel to shin. Normal rapid alternating movements.   Gait:    Heel-toe and tandem gait are normal.   Motor Observation:    No asymmetry, no atrophy, and no involuntary movements noted. Tone:    Normal muscle tone.    Posture:    Posture is normal. normal erect    Strength:    Strength is V/V in the upper and lower limbs.      Sensation: intact to LT     Reflex Exam:  DTR's:    Deep tendon reflexes in the upper and lower extremities are normal bilaterally.   Toes:    The toes are downgoing bilaterally.   Clonus:    Clonus is absent.       Assessment/Plan:  55 year old with a history of  epilepsy and recent GTCS in the setting of fever. EEG normal. Did not tolerate Keppra or Depakote. Doing well on Vimpat.  Patient is unable to drive, operate heavy machinery, perform activities at heights or participate in water activities until 6 months seizure free  F/u 1 year  Discussed Patients with epilepsy have a small risk of sudden unexpected death, a condition referred to as sudden unexpected death in epilepsy (SUDEP). SUDEP is defined specifically as the sudden, unexpected, witnessed or unwitnessed, nontraumatic and nondrowning death in patients with epilepsy with or without evidence for a seizure, and excluding documented status epilepticus, in which post mortem examination does not reveal a structural or toxicologic cause for death   Naomie Dean, MD  [facility] [address] [place], [place] [address]  Phone [phone] Fax [phone]  A total of 30 minutes was spent face-to-face with this patient. Over half this time was spent on counseling patient on the epilepsy diagnosis and different diagnostic and therapeutic options available.

## 2016-02-06 DIAGNOSIS — H16223 Keratoconjunctivitis sicca, not specified as Sjogren's, bilateral: Secondary | ICD-10-CM | POA: Diagnosis not present

## 2016-02-20 DIAGNOSIS — H01021 Squamous blepharitis right upper eyelid: Secondary | ICD-10-CM | POA: Diagnosis not present

## 2016-03-12 ENCOUNTER — Other Ambulatory Visit: Payer: 59

## 2016-03-12 ENCOUNTER — Other Ambulatory Visit: Payer: Self-pay

## 2016-03-12 DIAGNOSIS — N401 Enlarged prostate with lower urinary tract symptoms: Secondary | ICD-10-CM | POA: Diagnosis not present

## 2016-03-13 LAB — PSA: Prostate Specific Ag, Serum: 1.7 ng/mL (ref 0.0–4.0)

## 2016-03-18 NOTE — Progress Notes (Signed)
03/19/2016 8:54 AM   Lebron Conners. May 10, 1960 [id number]  Referring provider: Joaquim Nam, MD [address] [place], [place] [address]  Chief Complaint  Patient presents with  . Benign Prostatic Hypertrophy    HPI: Patient is a 56 year old Caucasian male who presents today for his annual prostate exam.  BPH WITH LUTS His IPSS score today is 11, which is moderate lower urinary tract symptomatology. He is mostly satisfied his quality life due to his urinary symptoms. His previous I PSS score was 9/2.  His major complaints today are frequency, urgency, intermittency, straining to urinate and nocturia x 2.  He denies any dysuria, hematuria or suprapubic pain.  He does have intermittent nocturia, but it is not bothersome to him.  He also denies any recent fevers, chills, nausea or vomiting.  He has a family history of PCa, with father and paternal grandfather having prostate cancer.        IPSS    Row Name 03/19/16 0800         International Prostate Symptom Score   How often have you had the sensation of not emptying your bladder? Less than 1 in 5     How often have you had to urinate less than every two hours? Less than half the time     How often have you found you stopped and started again several times when you urinated? Less than half the time     How often have you found it difficult to postpone urination? Less than half the time     How often have you had a weak urinary stream? Not at All     How often have you had to strain to start urination? Less than half the time     How many times did you typically get up at night to urinate? 2 Times     Total IPSS Score 11       Quality of Life due to urinary symptoms   If you were to spend the rest of your life with your urinary condition just the way it is now how would you feel about that? Mostly Satisfied        Score:  1-7 Mild 8-19 Moderate 20-35 Severe      PMH: Past Medical History:  Diagnosis Date   . Anxiety   . Arthritis    BOTH HANDS  . BPH (benign prostatic hypertrophy)   . Elevated PSA   . HLD (hyperlipidemia)   . Hx MRSA infection 2009  . Kidney stone   . Nocturia   . Over weight   . Rotator cuff tear   . Seizures [facility]     Surgical History: Past Surgical History:  Procedure Laterality Date  . COLONOSCOPY  2014?  . INGUINAL HERNIA REPAIR Left 02/17/2015   Procedure: LAPAROSCOPIC INGUINAL HERNIA;  Surgeon: Kieth Brightly, MD;  Location: [facility];  Service: General;  Laterality: Left;  . LITHOTRIPSY     x 2  . SHOULDER ARTHROSCOPY WITH OPEN ROTATOR CUFF REPAIR Left 09/23/2014   Procedure: SHOULDER ARTHROSCOPY , debridement, decompression, WITH  mini OPEN ROTATOR CUFF REPAIR;  Surgeon: Christena Flake, MD;  Location: [facility];  Service: Orthopedics;  Laterality: Left;  . SHOULDER SURGERY Bilateral    rotator cuff repair  . UMBILICAL HERNIA REPAIR N/A 02/17/2015   Procedure: LAPAROSCOPIC UMBILICAL HERNIA;  Surgeon: Kieth Brightly, MD;  Location: [facility];  Service: General;  Laterality: N/A;  Home Medications:  Allergies as of 03/19/2016      Reactions   Penicillins Other (See Comments)   REACTION: as child      Medication List       Accurate as of 03/19/16  8:54 AM. Always use your most recent med list.          escitalopram 10 MG tablet Commonly known as:  LEXAPRO TAKE 1 TABLET BY MOUTH ONCE A DAY   ibuprofen 200 MG tablet Commonly known as:  ADVIL,MOTRIN Take 400 mg by mouth every 6 (six) hours as needed.   Lacosamide 150 MG Tabs Take 1 tablet (150 mg total) by mouth 2 (two) times daily as needed.       Allergies:  Allergies  Allergen Reactions  . Penicillins Other (See Comments)    REACTION: as child    Family History: Family History  Problem Relation Age of Onset  . Prostate cancer Father   . Colon cancer Paternal Grandmother   . Kidney disease Paternal Uncle   . Kidney cancer Paternal Uncle     Social History:  reports  that he has never smoked. He quit smokeless tobacco use about 11 years ago. His smokeless tobacco use included Chew. He reports that he drinks alcohol. He reports that he does not use drugs.  ROS: UROLOGY Frequent Urination?: No Hard to postpone urination?: No Burning/pain with urination?: No Get up at night to urinate?: No Leakage of urine?: No Urine stream starts and stops?: No Trouble starting stream?: No Do you have to strain to urinate?: No Blood in urine?: No Urinary tract infection?: No Sexually transmitted disease?: No Injury to kidneys or bladder?: No Painful intercourse?: No Weak stream?: No Erection problems?: No Penile pain?: No  Gastrointestinal Nausea?: No Vomiting?: No Indigestion/heartburn?: No Diarrhea?: No Constipation?: No  Constitutional Fever: No Night sweats?: No Weight loss?: No Fatigue?: No  Skin Skin rash/lesions?: No Itching?: No  Eyes Blurred vision?: No Double vision?: No  Ears/Nose/Throat Sore throat?: No Sinus problems?: Yes  Hematologic/Lymphatic Swollen glands?: No Easy bruising?: No  Cardiovascular Leg swelling?: No Chest pain?: No  Respiratory Cough?: No Shortness of breath?: No  Endocrine Excessive thirst?: No  Musculoskeletal Back pain?: No Joint pain?: No  Neurological Headaches?: No Dizziness?: No  Psychologic Depression?: No Anxiety?: No  Physical Exam: BP 134/83 (BP Location: Left Arm, Patient Position: Sitting, Cuff Size: Large)   Pulse 70   Ht 5\' 9"  (1.753 m)   Wt 226 lb 1.6 oz (102.6 kg)   BMI 33.39 kg/m   Constitutional: Well nourished. Alert and oriented, No acute distress. HEENT: [place] AT, moist mucus membranes. Trachea midline, no masses. Cardiovascular: No clubbing, cyanosis, or edema. Respiratory: Normal respiratory effort, no increased work of breathing. GI: Abdomen is soft, non tender, non distended, no abdominal masses. Liver and spleen not palpable.  No hernias appreciated.  Stool  sample for occult testing is not indicated.   GU: No CVA tenderness.  No bladder fullness or masses.  Patient with circumcised phallus. Foreskin easily retracted  Urethral meatus is patent.  No penile discharge. No penile lesions or rashes. Scrotum without lesions, cysts, rashes and/or edema.  Testicles are located scrotally bilaterally. No masses are appreciated in the testicles. Left and right epididymis are normal. Rectal: Patient with  normal sphincter tone. Anus and perineum without scarring or rashes. No rectal masses are appreciated. Prostate and seminal vesicles (could not palpated entire gland due to buttocks tissue) Skin: No rashes, bruises or suspicious lesions.  Lymph: No cervical or inguinal adenopathy. Neurologic: Grossly intact, no focal deficits, moving all 4 extremities. Psychiatric: Normal mood and affect.  Laboratory Data: Lab Results  Component Value Date   WBC 7.4 08/29/2015   HGB 12.9 (L) 08/29/2015   HCT 36.8 (L) 08/29/2015   MCV 85.4 08/29/2015   PLT 188 08/29/2015   Lab Results  Component Value Date   CREATININE 1.02 08/29/2015      Component Value Date/Time   CHOL 201 (H) 12/28/2014 0825   HDL 45.50 12/28/2014 0825   CHOLHDL 4 12/28/2014 0825   VLDL 20.0 12/28/2014 0825   LDLCALC 135 (H) 12/28/2014 0825   Lab Results  Component Value Date   AST 31 08/29/2015   Lab Results  Component Value Date   ALT 27 08/29/2015   PSA history  1.2 ng/mL on 03/05/2012  0.8 ng/mL on 05/01/2012  1.0 ng/mL on 03/05/2013  0.8 ng/mL on 03/05/2014  1.0 ng/mL on 03/18/2015  1.7 ng/mL on 03/12/2016  Assessment & Plan:    1. BPH with LUTS  - IPSS score is 11/2, it is worsening  - Continue conservative management, avoiding bladder irritants and timed voiding's  - RTC in 12 months for IPSS, PSA and exam   2. Nocturia:   Patient is getting up 2 times a night on an intermittent basis.  It is not bothersome to him at this time. We will continue to monitor. He'll return  in 1 year for I PSS score.  3. Family history of prostate cancer:   Patient is father and paternal grandfather had been diagnosed with prostate cancer.  Patient will have yearly prostate exams and PSA's.  Return in about 1 year (around 03/19/2017) for IPSS, PSA and exam.  These notes generated with voice recognition software. I apologize for typographical errors.  Michiel CowboySHANNON Deyvi Bonanno, PA-C  [facility] [address], [address] [place], [place] [address] [phone]

## 2016-03-19 ENCOUNTER — Ambulatory Visit (INDEPENDENT_AMBULATORY_CARE_PROVIDER_SITE_OTHER): Payer: 59 | Admitting: Urology

## 2016-03-19 ENCOUNTER — Encounter: Payer: Self-pay | Admitting: Urology

## 2016-03-19 VITALS — BP 134/83 | HR 70 | Ht 69.0 in | Wt 226.1 lb

## 2016-03-19 DIAGNOSIS — Z8042 Family history of malignant neoplasm of prostate: Secondary | ICD-10-CM | POA: Diagnosis not present

## 2016-03-19 DIAGNOSIS — N138 Other obstructive and reflux uropathy: Secondary | ICD-10-CM | POA: Diagnosis not present

## 2016-03-19 DIAGNOSIS — N401 Enlarged prostate with lower urinary tract symptoms: Secondary | ICD-10-CM

## 2016-03-19 DIAGNOSIS — R351 Nocturia: Secondary | ICD-10-CM

## 2016-06-10 ENCOUNTER — Other Ambulatory Visit: Payer: Self-pay | Admitting: Family Medicine

## 2016-06-10 DIAGNOSIS — E785 Hyperlipidemia, unspecified: Secondary | ICD-10-CM

## 2016-06-13 ENCOUNTER — Other Ambulatory Visit (INDEPENDENT_AMBULATORY_CARE_PROVIDER_SITE_OTHER): Payer: 59

## 2016-06-13 ENCOUNTER — Other Ambulatory Visit: Payer: Self-pay | Admitting: Family Medicine

## 2016-06-13 DIAGNOSIS — Z119 Encounter for screening for infectious and parasitic diseases, unspecified: Secondary | ICD-10-CM

## 2016-06-13 DIAGNOSIS — E785 Hyperlipidemia, unspecified: Secondary | ICD-10-CM

## 2016-06-13 LAB — LIPID PANEL
CHOLESTEROL: 182 mg/dL (ref 0–200)
HDL: 38.4 mg/dL — ABNORMAL LOW (ref >39.00)
LDL CALC: 111 mg/dL — AB (ref 0–99)
NonHDL: 144
TRIGLYCERIDES: 167 mg/dL — AB (ref 0.0–149.0)
Total CHOL/HDL Ratio: 5
VLDL: 33.4 mg/dL (ref 0.0–40.0)

## 2016-06-13 LAB — COMPREHENSIVE METABOLIC PANEL
ALBUMIN: 4 g/dL (ref 3.5–5.2)
ALK PHOS: 61 U/L (ref 39–117)
ALT: 12 U/L (ref 0–53)
AST: 16 U/L (ref 0–37)
BUN: 22 mg/dL (ref 6–23)
CHLORIDE: 107 meq/L (ref 96–112)
CO2: 29 meq/L (ref 19–32)
Calcium: 9.3 mg/dL (ref 8.4–10.5)
Creatinine, Ser: 0.91 mg/dL (ref 0.40–1.50)
GFR: 91.75 mL/min (ref >60.00)
GLUCOSE: 101 mg/dL — AB (ref 70–99)
POTASSIUM: 4.5 meq/L (ref 3.5–5.1)
SODIUM: 140 meq/L (ref 135–145)
Total Bilirubin: 0.4 mg/dL (ref 0.2–1.2)
Total Protein: 7.1 g/dL (ref 6.0–8.3)

## 2016-06-14 LAB — HIV ANTIBODY (ROUTINE TESTING W REFLEX): HIV 1&2 Ab, 4th Generation: NONREACTIVE

## 2016-06-14 LAB — HEPATITIS C ANTIBODY: HCV Ab: NEGATIVE

## 2016-06-18 ENCOUNTER — Encounter: Payer: Self-pay | Admitting: Family Medicine

## 2016-06-18 ENCOUNTER — Ambulatory Visit (INDEPENDENT_AMBULATORY_CARE_PROVIDER_SITE_OTHER): Payer: 59 | Admitting: Family Medicine

## 2016-06-18 VITALS — BP 132/78 | HR 67 | Temp 98.7°F | Ht 69.0 in | Wt 219.1 lb

## 2016-06-18 DIAGNOSIS — Z0000 Encounter for general adult medical examination without abnormal findings: Secondary | ICD-10-CM

## 2016-06-18 DIAGNOSIS — R569 Unspecified convulsions: Secondary | ICD-10-CM

## 2016-06-18 DIAGNOSIS — Z566 Other physical and mental strain related to work: Secondary | ICD-10-CM

## 2016-06-18 DIAGNOSIS — Z7189 Other specified counseling: Secondary | ICD-10-CM

## 2016-06-18 DIAGNOSIS — N138 Other obstructive and reflux uropathy: Secondary | ICD-10-CM

## 2016-06-18 DIAGNOSIS — N401 Enlarged prostate with lower urinary tract symptoms: Secondary | ICD-10-CM

## 2016-06-18 MED ORDER — ESCITALOPRAM OXALATE 10 MG PO TABS: 10.0000 mg | ORAL_TABLET | Freq: Every day | ORAL | 3 refills | Status: DC

## 2016-06-18 NOTE — Assessment & Plan Note (Signed)
Living will d/w pt.  Wife designated if patient were incapacitated.   ?

## 2016-06-18 NOTE — Assessment & Plan Note (Signed)
SSRI may have/likely helped some. Reasonable to continue.  Will have him change to taking at night to see if he sleeps better; melatonin may help.  He'll update me as needed.  He agrees.

## 2016-06-18 NOTE — Assessment & Plan Note (Signed)
Per uro.  I'll defer.  He agrees.

## 2016-06-18 NOTE — Patient Instructions (Addendum)
Try lexapro at night and if needed then add on melatonin.   Take care.  Glad to see you.  Update me as needed.

## 2016-06-18 NOTE — Progress Notes (Signed)
CPE- See plan.  Routine anticipatory guidance given to patient.  See health maintenance.  The possibility exists that previously documented standard health maintenance information may have been brought forward from a previous encounter into this note.  If needed, that same information has been updated to reflect the current situation based on today's encounter.    Tetanus done at work, 2011 Flu done yearly, d/w pt.   PNA and shingles not due  PSA screening- done by uro, I'll defer, d/w pt.   Colonoscopy done prev, 2014. Was told 10 year f/u. [facility]  Living will d/w pt. Wife designated if patient were incapacitated.  Diet and exercise d/w pt. Encouraged both.  He got back to exercising.   HCV and HIV screening neg.  Labs d/w pt.  TG and sugar elevation d/w pt.  Diet and exercise would help, d/w pt.   Anxiety.  On SSRI.  It may have helped some but he is still having some trouble sleeping.  He takes lexapro.  He hasn't tried taking lexapro at night.  No SI/HI.  He isn't working at this point.    SZ per neuro.  I'll defer.  No events.  Compliant with meds.    PMH and SH reviewed  Meds, vitals, and allergies reviewed.   ROS: Per HPI.  Unless specifically indicated otherwise in HPI, the patient denies:  General: fever. Eyes: acute vision changes ENT: sore throat Cardiovascular: chest pain Respiratory: SOB GI: vomiting GU: dysuria Musculoskeletal: acute back pain Derm: acute rash Neuro: acute motor dysfunction Psych: worsening mood Endocrine: polydipsia Heme: bleeding Allergy: hayfever  GEN: nad, alert and oriented HEENT: mucous membranes moist NECK: supple w/o LA CV: rrr. PULM: ctab, no inc wob ABD: soft, +bs EXT: no edema SKIN: no acute rash

## 2016-06-18 NOTE — Assessment & Plan Note (Signed)
Tetanus done at work, 2011 Flu done yearly, d/w pt.   PNA and shingles not due  PSA screening- done by uro, I'll defer, d/w pt.   Colonoscopy done prev, 2014. Was told 10 year f/u. [facility]  Living will d/w pt. Wife designated if patient were incapacitated.  Diet and exercise d/w pt. Encouraged both.  He got back to exercising.   HCV and HIV screening neg.  Labs d/w pt.  TG and sugar elevation d/w pt.  Diet and exercise would help, d/w pt.

## 2016-06-18 NOTE — Assessment & Plan Note (Signed)
I'll defer to neurology.  No SZ activity.   Complaint.

## 2016-07-08 ENCOUNTER — Other Ambulatory Visit: Payer: Self-pay | Admitting: Neurology

## 2016-07-09 ENCOUNTER — Other Ambulatory Visit: Payer: Self-pay | Admitting: Neurology

## 2016-07-10 NOTE — Telephone Encounter (Signed)
Rx printed- signed and faxed to pharmacy.

## 2016-11-29 ENCOUNTER — Ambulatory Visit: Payer: BLUE CROSS/BLUE SHIELD | Admitting: Adult Health

## 2016-12-13 ENCOUNTER — Ambulatory Visit: Payer: 59 | Admitting: Adult Health

## 2016-12-13 ENCOUNTER — Encounter: Payer: Self-pay | Admitting: Adult Health

## 2016-12-13 VITALS — BP 135/80 | HR 74 | Wt 224.0 lb

## 2016-12-13 DIAGNOSIS — R569 Unspecified convulsions: Secondary | ICD-10-CM | POA: Diagnosis not present

## 2016-12-13 MED ORDER — LACOSAMIDE 150 MG PO TABS: 1.0000 | ORAL_TABLET | Freq: Two times a day (BID) | ORAL | 1 refills | Status: DC | PRN

## 2016-12-13 MED ORDER — LACOSAMIDE 150 MG PO TABS: 1.0000 | ORAL_TABLET | Freq: Two times a day (BID) | ORAL | 5 refills | Status: DC | PRN

## 2016-12-13 NOTE — Progress Notes (Signed)
I have read the note, and I agree with the clinical assessment and plan.  Lynett Brasil A. Kamyra Schroeck, MD, PhD, FAAN Certified in Neurology, Clinical Neurophysiology, Sleep Medicine, Pain Medicine and Neuroimaging  [facility] [address], [address] [place], [place] [address] [phone]  

## 2016-12-13 NOTE — Patient Instructions (Signed)
Your Plan:  Continue Vimpat If your symptoms worsen or you develop new symptoms please let us know.   Thank you for coming to see us at [facility]. I hope we have been able to provide you high quality care today.  You may receive a patient satisfaction survey over the next few weeks. We would appreciate your feedback and comments so that we may continue to improve ourselves and the health of our patients.  

## 2016-12-13 NOTE — Progress Notes (Signed)
PATIENT: Mark Connersony G Kaseman Jr. DOB: 09/28/1960  REASON FOR VISIT: follow up-seizures HISTORY FROM: patient  HISTORY OF PRESENT ILLNESS: Today 12/13/16 [name] is a 56 year old male with a history of seizures.  He returns today for follow-up.  He is currently taking Vimpat and tolerating it well.  He denies any recent seizure events.  He operates a Librarian, academicmotor vehicle without difficulty.  He is able to complete all ADLs independently.  He reports that he has retired and he feels that his stress level has improved.  He denies any changes with his mood or behavior.  Denies any changes with his gait or balance.  He returns today for an evaluation.  HISTORY 11/29/15: Patient is doing well on Vimpat. He did not tolerate Kappra or Depakote. He pays $40 a month for the medication. EEG was normal. No recurrent seizures. Discussed seizures, seizure precautions.  ZOX:WRUEHPI:Mark G Propp Hernandezis a 56 y.o.malehere as a referral from Dr. Buelah Manisuncanfor seizures. Past medical history of syncope and collapse, nocturia, BPH, work-related stress and epilepsy. Last Tuesday they were watching TV and he started breathing heavy and he started jerking whole body for one minute in the setting of illness and fever, bit tongue, eyes open, confused afterwards. Speech was slurred. Blood coming out of mouth. Wife provides information. He had staring spells as a child and he was diagnosed with epilepsy, absence and was started on phenobarb at the age of 89 was on it for 20 years. He has been off of the medicine for 20 years. In 2015 he had an episode of vertigo, he was under stress, then he was "out" no one witnessed it, he was found confused, he bit his tongue. He had been outside the whole day before this recent event, he has a fever. No family history of seizures. No other focal neurologic deficits or complaints.  Reviewed notes, labs and imaging from outside physicians, which showed:  CBC was normal, CMP was unremarkable labs  08/17/2015.  Patient was seen in the emergency room 08/16/2015. Complained of seizure activity for 1 hour. He was somewhat she TV when he lost consciousness. Per wife he was lying in bed with her when he started breathing rapidly and she noticed tonic-clonic jerking with eyes fixed. Lasted a few minutes. Postictal confusion. Patient bit his tongue. No urinary incontinence. Patient confessed that he used to be epileptic and was on phenobarbital as a child. He has been off of medication seen in 2015 for similar seizure episode. He was seen by an neurologist negative MRI and EEG.Exam inemergency room noteda bite Mark on the right mid tongue. Otherwise unremarkable.  Neurology appointment on 04/2013 noted seizure activity likely from stress/insomnia. EEG normal. MRI normal. At that time, pt had urinary incontinence and was post-ictal. Otherwise unremarkable during that time.   MRI of the brain April 2015, personally reviewed images and agree with the following: Dedicated thin section imaging through the temporal lobes demonstrates normal volume and signal of the hippocampi. There is no evidence of heterotopia.  There is no acute infarct. Ventricles and sulci are normal for age. There is no evidence of intracranial hemorrhage, mass, midline shift, or extra-axial fluid collection. No brain parenchymal signal abnormality is identified. There is no abnormal enhancement.  Orbits are unremarkable. Paranasal sinuses and mastoid air cells are clear. Major intracranial vascular flow voids are preserved. Calvarium and scalp soft tissues are unremarkable.  IMPRESSION: Unremarkable brain MRI. No etiology of seizures identified.   REVIEW OF SYSTEMS: Out of  a complete 14 system review of symptoms, the patient complains only of the following symptoms, and all other reviewed systems are negative.  Cough, insomnia  ALLERGIES: Allergies  Allergen Reactions  . Penicillins Other (See Comments)     REACTION: as child    HOME MEDICATIONS: Outpatient Medications Prior to Visit  Medication Sig Dispense Refill  . escitalopram (LEXAPRO) 10 MG tablet Take 1 tablet (10 mg total) by mouth daily. 90 tablet 3  . ibuprofen (ADVIL,MOTRIN) 200 MG tablet Take 400 mg by mouth every 6 (six) hours as needed.    [name]. VIMPAT 150 MG TABS TAKE 1 TABLET BY MOUTH TWICE A DAY AS NEEDED 180 tablet 1   No facility-administered medications prior to visit.     PAST MEDICAL HISTORY: Past Medical History:  Diagnosis Date  . Anxiety   . Arthritis    BOTH HANDS  . BPH (benign prostatic hypertrophy)   . Elevated PSA   . HLD (hyperlipidemia)   . Hx MRSA infection 2009  . Kidney stone   . Nocturia   . Over weight   . Rotator cuff tear   . Seizures (HCC)     PAST SURGICAL HISTORY: Past Surgical History:  Procedure Laterality Date  . COLONOSCOPY  2014?  . INGUINAL HERNIA REPAIR Left 02/17/2015   Procedure: LAPAROSCOPIC INGUINAL HERNIA;  Surgeon: Kieth BrightlySeeplaputhur G Sankar, MD;  Location: [facility];  Service: General;  Laterality: Left;  . LITHOTRIPSY     x 2  . SHOULDER ARTHROSCOPY WITH OPEN ROTATOR CUFF REPAIR Left 09/23/2014   Procedure: SHOULDER ARTHROSCOPY , debridement, decompression, WITH  mini OPEN ROTATOR CUFF REPAIR;  Surgeon: Christena FlakeJohn J Poggi, MD;  Location: [facility];  Service: Orthopedics;  Laterality: Left;  . SHOULDER SURGERY Bilateral    rotator cuff repair  . UMBILICAL HERNIA REPAIR N/A 02/17/2015   Procedure: LAPAROSCOPIC UMBILICAL HERNIA;  Surgeon: Kieth BrightlySeeplaputhur G Sankar, MD;  Location: [facility];  Service: General;  Laterality: N/A;    FAMILY HISTORY: Family History  Problem Relation Age of Onset  . COPD Mother   . Prostate cancer Father   . Colon cancer Paternal Grandmother   . Kidney disease Paternal Uncle   . Kidney cancer Paternal Uncle     SOCIAL HISTORY: Social History   Socioeconomic History  . Marital status: Married    Spouse name: [name]  . Number of children: 2  . Years of  education: 4914  . Highest education level: Not on file  Social Needs  . Financial resource strain: Not on file  . Food insecurity - worry: Not on file  . Food insecurity - inability: Not on file  . Transportation needs - medical: Not on file  . Transportation needs - non-medical: Not on file  Occupational History  . Occupation: Engineer, structuralire Chief   Tobacco Use  . Smoking status: Never Smoker  . Smokeless tobacco: Former NeurosurgeonUser    Types: Chew  Substance and Sexual Activity  . Alcohol use: Yes    Comment: occ  . Drug use: No  . Sexual activity: Not on file  Other Topics Concern  . 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  Generalized: Well developed, in no acute distress   Neurological examination  Mentation: Alert oriented to time, place, history taking. Follows all commands speech and language fluent Cranial nerve II-XII: Pupils were equal round reactive to light. Extraocular movements were full, visual field were full on confrontational test. Facial sensation and strength were normal. Uvula tongue midline. Head turning and shoulder shrug  were normal and symmetric. Motor: The motor testing reveals 5 over 5 strength of all 4 extremities. Good symmetric motor tone is noted throughout.  Sensory: Sensory testing is intact to soft touch on all 4 extremities. No evidence of extinction is noted.  Coordination: Cerebellar testing reveals good finger-nose-finger and heel-to-shin bilaterally.  Gait and station: Gait is normal. Tandem gait is normal. Romberg is negative. No drift is seen.  Reflexes: Deep tendon reflexes are symmetric and normal bilaterally.   DIAGNOSTIC DATA (LABS, IMAGING, TESTING) - I  reviewed patient records, labs, notes, testing and imaging myself where available.  Lab Results  Component Value Date   WBC 7.4 08/29/2015   HGB 12.9 (L) 08/29/2015   HCT 36.8 (L) 08/29/2015   MCV 85.4 08/29/2015   PLT 188 08/29/2015      Component Value Date/Time   NA 140 06/13/2016 0820   K 4.5 06/13/2016 0820   CL 107 06/13/2016 0820   CO2 29 06/13/2016 0820   GLUCOSE 101 (H) 06/13/2016 0820   BUN 22 06/13/2016 0820   CREATININE 0.91 06/13/2016 0820   CALCIUM 9.3 06/13/2016 0820   PROT 7.1 06/13/2016 0820   ALBUMIN 4.0 06/13/2016 0820   AST 16 06/13/2016 0820   ALT 12 06/13/2016 0820   ALKPHOS 61 06/13/2016 0820   BILITOT 0.4 06/13/2016 0820   GFRNONAA >60 08/29/2015 1840   GFRAA >60 08/29/2015 1840   Lab Results  Component Value Date   CHOL 182 06/13/2016   HDL 38.40 (L) 06/13/2016   LDLCALC 111 (H) 06/13/2016   TRIG 167.0 (H) 06/13/2016   CHOLHDL 5 06/13/2016   No results found for: HGBA1C No results found for: VITAMINB12 No results found for: TSH    ASSESSMENT AND PLAN 56 y.o. year old male  has a past medical history of Anxiety, Arthritis, BPH (benign prostatic hypertrophy), Elevated PSA, HLD (hyperlipidemia), MRSA infection (2009), Kidney stone, Nocturia, Over weight, Rotator cuff tear, and Seizures (HCC). here with:  1.  Seizures  Overall the patient is doing well.  He will continue on Vimpat 150 mg twice a day.  He is advised that if he has any seizure events he should let us know.  He will follow-up in 1 year or sooner if needed.  I spent 15 minutes with the patient. 50% of this time was spent discussing his medication.   Butch Penny, MSN, NP-C 12/13/2016, 7:25 AM [facility] [address] [place], [place] [address] [phone]

## 2016-12-13 NOTE — Progress Notes (Signed)
Vimpat refill Rx [facility] [place].

## 2017-03-12 ENCOUNTER — Other Ambulatory Visit: Payer: Self-pay

## 2017-03-12 ENCOUNTER — Other Ambulatory Visit: Payer: 59

## 2017-03-12 DIAGNOSIS — N401 Enlarged prostate with lower urinary tract symptoms: Secondary | ICD-10-CM | POA: Diagnosis not present

## 2017-03-13 LAB — PSA: Prostate Specific Ag, Serum: 1 ng/mL (ref 0.0–4.0)

## 2017-03-19 ENCOUNTER — Encounter: Payer: Self-pay | Admitting: Urology

## 2017-03-19 ENCOUNTER — Ambulatory Visit: Payer: 59 | Admitting: Urology

## 2017-03-19 VITALS — BP 143/93 | HR 67 | Ht 69.0 in | Wt 221.4 lb

## 2017-03-19 DIAGNOSIS — N401 Enlarged prostate with lower urinary tract symptoms: Secondary | ICD-10-CM

## 2017-03-19 DIAGNOSIS — N138 Other obstructive and reflux uropathy: Secondary | ICD-10-CM

## 2017-03-19 DIAGNOSIS — Z8042 Family history of malignant neoplasm of prostate: Secondary | ICD-10-CM | POA: Diagnosis not present

## 2017-03-19 DIAGNOSIS — R351 Nocturia: Secondary | ICD-10-CM

## 2017-03-19 NOTE — Progress Notes (Signed)
03/19/2017 9:38 AM   Mark Hernandez. [birth year] [id number]  Referring provider: Joaquim Nam, MD [address] [place], [place] [address]  Chief Complaint  Patient presents with  . Benign Prostatic Hypertrophy    HPI: Patient is a 57 year old Caucasian male who presents today for his annual prostate exam.  BPH WITH LUTS His IPSS score today is 15, which is moderate lower urinary tract symptomatology. He is mostly satisfied his quality life due to his urinary symptoms. His previous I PSS score was 11/2.  His major complaints today are nocturia x 2.  He denies any dysuria, hematuria or suprapubic pain.   He also denies any recent fevers, chills, nausea or vomiting.  He has a family history of PCa, with father and paternal grandfather having prostate cancer.    IPSS    Row Name 03/19/17 0900         International Prostate Symptom Score   How often have you had the sensation of not emptying your bladder?  Less than half the time     How often have you had to urinate less than every two hours?  About half the time     How often have you found you stopped and started again several times when you urinated?  Less than half the time     How often have you found it difficult to postpone urination?  About half the time     How often have you had a weak urinary stream?  Less than half the time     How often have you had to strain to start urination?  Less than half the time     How many times did you typically get up at night to urinate?  1 Time     Total IPSS Score  15       Quality of Life due to urinary symptoms   If you were to spend the rest of your life with your urinary condition just the way it is now how would you feel about that?  Mostly Satisfied        Score:  1-7 Mild 8-19 Moderate 20-35 Severe      PMH: Past Medical History:  Diagnosis Date  . Anxiety   . Arthritis    BOTH HANDS  . BPH (benign prostatic hypertrophy)   . Elevated PSA   . HLD  (hyperlipidemia)   . Hx MRSA infection 2009  . Kidney stone   . Nocturia   . Over weight   . Rotator cuff tear   . Seizures [facility])     Surgical History: Past Surgical History:  Procedure Laterality Date  . COLONOSCOPY  2014?  . INGUINAL HERNIA REPAIR Left 02/17/2015   Procedure: LAPAROSCOPIC INGUINAL HERNIA;  Surgeon: Kieth Brightly, MD;  Location: [facility];  Service: General;  Laterality: Left;  . LITHOTRIPSY     x 2  . SHOULDER ARTHROSCOPY WITH OPEN ROTATOR CUFF REPAIR Left 09/23/2014   Procedure: SHOULDER ARTHROSCOPY , debridement, decompression, WITH  mini OPEN ROTATOR CUFF REPAIR;  Surgeon: Christena Flake, MD;  Location: [facility];  Service: Orthopedics;  Laterality: Left;  . SHOULDER SURGERY Bilateral    rotator cuff repair  . UMBILICAL HERNIA REPAIR N/A 02/17/2015   Procedure: LAPAROSCOPIC UMBILICAL HERNIA;  Surgeon: Kieth Brightly, MD;  Location: [facility];  Service: General;  Laterality: N/A;    Home Medications:  Allergies as of 03/19/2017      Reactions  Penicillins Other (See Comments)   REACTION: as child      Medication List        Accurate as of 03/19/17  9:38 AM. Always use your most recent med list.          escitalopram 10 MG tablet Commonly known as:  LEXAPRO Take 1 tablet (10 mg total) by mouth daily.   ibuprofen 200 MG tablet Commonly known as:  ADVIL,MOTRIN Take 400 mg by mouth every 6 (six) hours as needed.   Lacosamide 150 MG Tabs Commonly known as:  VIMPAT Take 1 tablet (150 mg total) 2 (two) times daily as needed by mouth.       Allergies:  Allergies  Allergen Reactions  . Penicillins Other (See Comments)    REACTION: as child    Family History: Family History  Problem Relation Age of Onset  . COPD Mother   . Prostate cancer Father   . Colon cancer Paternal Grandmother   . Kidney disease Paternal Uncle   . Kidney cancer Paternal Uncle     Social History:  reports that  has never smoked. He quit smokeless tobacco  use about 12 years ago. His smokeless tobacco use included chew. He reports that he drinks alcohol. He reports that he does not use drugs.  ROS: UROLOGY Frequent Urination?: No Hard to postpone urination?: No Burning/pain with urination?: No Get up at night to urinate?: Yes Leakage of urine?: No Urine stream starts and stops?: No Trouble starting stream?: No Do you have to strain to urinate?: No Blood in urine?: No Urinary tract infection?: No Sexually transmitted disease?: No Injury to kidneys or bladder?: No Painful intercourse?: No Weak stream?: No Erection problems?: No Penile pain?: No  Gastrointestinal Nausea?: No Vomiting?: No Indigestion/heartburn?: No Diarrhea?: No Constipation?: No  Constitutional Fever: No Night sweats?: No Weight loss?: No Fatigue?: No  Skin Skin rash/lesions?: No Itching?: No  Eyes Blurred vision?: No Double vision?: No  Ears/Nose/Throat Sore throat?: No Sinus problems?: No  Hematologic/Lymphatic Swollen glands?: No Easy bruising?: No  Cardiovascular Leg swelling?: No Chest pain?: No  Respiratory Cough?: No Shortness of breath?: No  Endocrine Excessive thirst?: No  Musculoskeletal Back pain?: No Joint pain?: No  Neurological Headaches?: No Dizziness?: No  Psychologic Depression?: No Anxiety?: No  Physical Exam: BP (!) 143/93 (BP Location: Right Arm, Patient Position: Sitting, Cuff Size: Normal)   Pulse 67   Ht 5\' 9"  (1.753 m)   Wt 221 lb 6.4 oz (100.4 kg)   BMI 32.70 kg/m   Constitutional: Well nourished. Alert and oriented, No acute distress. HEENT: [place] AT, moist mucus membranes. Trachea midline, no masses. Cardiovascular: No clubbing, cyanosis, or edema. Respiratory: Normal respiratory effort, no increased work of breathing. GI: Abdomen is soft, non tender, non distended, no abdominal masses. Liver and spleen not palpable.  No hernias appreciated.  Stool sample for occult testing is not indicated.     GU: No CVA tenderness.  No bladder fullness or masses.  Patient with circumcised phallus.   Urethral meatus is patent.  No penile discharge. No penile lesions or rashes. Scrotum without lesions, cysts, rashes and/or edema.  Testicles are located scrotally bilaterally. No masses are appreciated in the testicles. Left and right epididymis are normal. Rectal: Patient with  normal sphincter tone. Anus and perineum without scarring or rashes. No rectal masses are appreciated. Prostate is approximately 55 grams, no nodules are appreciated. Seminal vesicles are normal. (DRE limited to patient's body habitus) Skin: No rashes, bruises  or suspicious lesions. Lymph: No cervical or inguinal adenopathy. Neurologic: Grossly intact, no focal deficits, moving all 4 extremities. Psychiatric: Normal mood and affect.   Laboratory Data: Lab Results  Component Value Date   WBC 7.4 08/29/2015   HGB 12.9 (L) 08/29/2015   HCT 36.8 (L) 08/29/2015   MCV 85.4 08/29/2015   PLT 188 08/29/2015   Lab Results  Component Value Date   CREATININE 0.91 06/13/2016      Component Value Date/Time   CHOL 182 06/13/2016 0820   HDL 38.40 (L) 06/13/2016 0820   CHOLHDL 5 06/13/2016 0820   VLDL 33.4 06/13/2016 0820   LDLCALC 111 (H) 06/13/2016 0820   Lab Results  Component Value Date   AST 16 06/13/2016   Lab Results  Component Value Date   ALT 12 06/13/2016   PSA history  1.2 ng/mL on 03/05/2012  0.8 ng/mL on 05/01/2012  1.0 ng/mL on 03/05/2013  0.8 ng/mL on 03/05/2014  1.0 ng/mL on 03/18/2015  1.7 ng/mL on 03/12/2016  1.0 ng/mL on 03/12/2017  I have reviewed the labs.  Assessment & Plan:    1. BPH with LUTS  - IPSS score is 15/2, it is worsening  - Continue conservative management, avoiding bladder irritants and timed voiding's  - RTC in 12 months for IPSS, PSA and exam   2. Nocturia:   Patient is getting up 2 times a night on an intermittent basis.  It is not bothersome to him at this time. We will  continue to monitor. He'll return in 1 year for I PSS score.  3. Family history of prostate cancer:   Patient is father and paternal grandfather had been diagnosed with prostate cancer.  Patient will have yearly prostate exams and PSA's.  Return in about 1 year (around 03/19/2018) for IPSS, PSA and exam.  These notes generated with voice recognition software. I apologize for typographical errors.  Michiel CowboySHANNON Kinnley Paulson, PA-C  [facility] [address], [address] [place], [place] [address] [phone]

## 2017-07-02 ENCOUNTER — Other Ambulatory Visit: Payer: Self-pay | Admitting: Adult Health

## 2017-07-02 ENCOUNTER — Other Ambulatory Visit: Payer: Self-pay | Admitting: Family Medicine

## 2017-07-02 NOTE — Telephone Encounter (Signed)
[facility] [place].  

## 2017-08-12 ENCOUNTER — Other Ambulatory Visit: Payer: Self-pay | Admitting: Family Medicine

## 2017-08-12 DIAGNOSIS — E785 Hyperlipidemia, unspecified: Secondary | ICD-10-CM

## 2017-08-15 ENCOUNTER — Other Ambulatory Visit (INDEPENDENT_AMBULATORY_CARE_PROVIDER_SITE_OTHER): Payer: 59

## 2017-08-15 DIAGNOSIS — E785 Hyperlipidemia, unspecified: Secondary | ICD-10-CM

## 2017-08-15 LAB — LIPID PANEL
CHOL/HDL RATIO: 4
CHOLESTEROL: 186 mg/dL (ref 0–200)
HDL: 44.1 mg/dL (ref >39.00)
LDL CALC: 107 mg/dL — AB (ref 0–99)
NonHDL: 141.96
TRIGLYCERIDES: 174 mg/dL — AB (ref 0.0–149.0)
VLDL: 34.8 mg/dL (ref 0.0–40.0)

## 2017-08-15 LAB — COMPREHENSIVE METABOLIC PANEL
ALBUMIN: 3.9 g/dL (ref 3.5–5.2)
ALT: 13 U/L (ref 0–53)
AST: 16 U/L (ref 0–37)
Alkaline Phosphatase: 68 U/L (ref 39–117)
BUN: 20 mg/dL (ref 6–23)
CALCIUM: 9.2 mg/dL (ref 8.4–10.5)
CHLORIDE: 105 meq/L (ref 96–112)
CO2: 29 meq/L (ref 19–32)
Creatinine, Ser: 0.89 mg/dL (ref 0.40–1.50)
GFR: 93.73 mL/min (ref >60.00)
Glucose, Bld: 97 mg/dL (ref 70–99)
POTASSIUM: 4.2 meq/L (ref 3.5–5.1)
Sodium: 138 mEq/L (ref 135–145)
Total Bilirubin: 0.4 mg/dL (ref 0.2–1.2)
Total Protein: 7.2 g/dL (ref 6.0–8.3)

## 2017-08-22 ENCOUNTER — Ambulatory Visit (INDEPENDENT_AMBULATORY_CARE_PROVIDER_SITE_OTHER): Payer: 59 | Admitting: Family Medicine

## 2017-08-22 ENCOUNTER — Encounter: Payer: Self-pay | Admitting: Family Medicine

## 2017-08-22 VITALS — BP 126/82 | HR 76 | Temp 98.7°F | Ht 69.0 in | Wt 220.5 lb

## 2017-08-22 DIAGNOSIS — Z0000 Encounter for general adult medical examination without abnormal findings: Secondary | ICD-10-CM

## 2017-08-22 DIAGNOSIS — N401 Enlarged prostate with lower urinary tract symptoms: Secondary | ICD-10-CM

## 2017-08-22 DIAGNOSIS — N138 Other obstructive and reflux uropathy: Secondary | ICD-10-CM

## 2017-08-22 DIAGNOSIS — Z7189 Other specified counseling: Secondary | ICD-10-CM

## 2017-08-22 DIAGNOSIS — R569 Unspecified convulsions: Secondary | ICD-10-CM

## 2017-08-22 DIAGNOSIS — R911 Solitary pulmonary nodule: Secondary | ICD-10-CM

## 2017-08-22 DIAGNOSIS — E785 Hyperlipidemia, unspecified: Secondary | ICD-10-CM

## 2017-08-22 DIAGNOSIS — F419 Anxiety disorder, unspecified: Secondary | ICD-10-CM

## 2017-08-22 MED ORDER — LACOSAMIDE 150 MG PO TABS: 1.0000 | ORAL_TABLET | Freq: Two times a day (BID) | ORAL | Status: DC

## 2017-08-22 MED ORDER — ESCITALOPRAM OXALATE 10 MG PO TABS: 10.0000 mg | ORAL_TABLET | Freq: Every day | ORAL | 3 refills | Status: DC

## 2017-08-22 NOTE — Progress Notes (Signed)
CPE- See plan.  Routine anticipatory guidance given to patient.  See health maintenance.  The possibility exists that previously documented standard health maintenance information may have been brought forward from a previous encounter into this note.  If needed, that same information has been updated to reflect the current situation based on today's encounter.    Tetanus done at work, 2011 Flu done yearly, d/w pt.   PNA not due  shingles out of stock.   PSA screening- done by uro, I'll defer, d/w pt.   Colonoscopy done prev, 2014. Was told 10 year f/u. [facility]  Living will d/w pt. Wife designated if patient were incapacitated.  Diet and exercise d/w pt. Encouraged both.  HCV and HIV screening neg.  Labs d/w pt.   Prev pulmonary nodule hx d/w pt.  See avs.  He has a cough attributed to allergies at baseline but that isn't different now.  No other pulmonary sx. based on the previous imaging follow-up CT was optional.  He is a non-smoker.  We talked about options.  He wants to consider if he will have follow-up imaging done.  I will await input from the patient.  We talked about pros and cons of repeat imaging.   Hyperlipidemia.  Labs discussed with patient.  ASCVD score is approximately 6%.  We talked about previous chest CT that noticed incidental coronary atherosclerosis.  This does not quantify the severity.  He does not have exertional symptoms.  No CP.  We talked about statin treatment empirically versus continued work on diet and exercise.  He declines statin treatment at this point.  D/w pt about options.   Anxiety.  Still on lexapro daily.  Still with episodic sx, with flares of sx.  "I'll start worrying about things."  He can get nervous with an event.  He tries to limit caffeine, as that is a trigger.  Sx noted in the last few years.  He may go weeks to months w/o sx, then have an event.  No SI/HI.  D/w pt about cutting back caffeine, getting enough exercise, keeping a diary  re: sx.     SZ hx.  No sx.  ADE on med.  Compliant.  Has seen neuro.    PMH and SH reviewed  Meds, vitals, and allergies reviewed.   ROS: Per HPI.  Unless specifically indicated otherwise in HPI, the patient denies:  General: fever. Eyes: acute vision changes ENT: sore throat Cardiovascular: chest pain Respiratory: SOB GI: vomiting GU: dysuria Musculoskeletal: acute back pain Derm: acute rash Neuro: acute motor dysfunction Psych: worsening mood Endocrine: polydipsia Heme: bleeding Allergy: hayfever  GEN: nad, alert and oriented HEENT: mucous membranes moist NECK: supple w/o LA CV: rrr. PULM: ctab, no inc wob ABD: soft, +bs EXT: no edema SKIN: no acute rash

## 2017-08-22 NOTE — Patient Instructions (Addendum)
It would not be wrong to get the follow up CT chest done.  Think about it and let me know if you want to proceed.   Continue the lexapro and keep a diary.  Limit caffeine.  Work on diet and exercise.  Take care.  Glad to see you.

## 2017-08-25 DIAGNOSIS — F419 Anxiety disorder, unspecified: Secondary | ICD-10-CM | POA: Insufficient documentation

## 2017-08-25 DIAGNOSIS — E785 Hyperlipidemia, unspecified: Secondary | ICD-10-CM | POA: Insufficient documentation

## 2017-08-25 DIAGNOSIS — R911 Solitary pulmonary nodule: Secondary | ICD-10-CM | POA: Insufficient documentation

## 2017-08-25 NOTE — Assessment & Plan Note (Signed)
Prev pulmonary nodule hx d/w pt.  See avs.  He has a cough attributed to allergies at baseline but that isn't different now.  No other pulmonary sx. based on the previous imaging follow-up CT was optional.  He is a non-smoker.  We talked about options.  He wants to consider if he will have follow-up imaging done.  I will await input from the patient.  We talked about pros and cons of repeat imaging.

## 2017-08-25 NOTE — Assessment & Plan Note (Signed)
Living will d/w pt.  Wife designated if patient were incapacitated.   ?

## 2017-08-25 NOTE — Assessment & Plan Note (Signed)
Per neuro.  I'll defer.   complaint.  No episodes.

## 2017-08-25 NOTE — Assessment & Plan Note (Signed)
Tetanus done at work, 2011 Flu done yearly, d/w pt.   PNA not due  shingles out of stock.   PSA screening- done by uro, I'll defer, d/w pt.   Colonoscopy done prev, 2014. Was told 10 year f/u. [facility].  Living will d/w pt. Wife designated if patient were incapacitated.  Diet and exercise d/w pt. Encouraged both.  HCV and HIV screening neg.  Labs d/w pt.

## 2017-08-25 NOTE — Assessment & Plan Note (Signed)
Labs discussed with patient.  ASCVD score is approximately 6%.  We talked about previous chest CT that noticed incidental coronary atherosclerosis.  This does not quantify the severity.  He does not have exertional symptoms.  No CP.  We talked about statin treatment empirically versus continued work on diet and exercise.  He declines statin treatment at this point.  D/w pt about options.

## 2017-08-25 NOTE — Assessment & Plan Note (Signed)
Still on lexapro daily.  Still with episodic sx, with flares of sx.  "I'll start worrying about things."  He can get nervous with an event.  He tries to limit caffeine, as that is a trigger.  Sx noted in the last few years.  He may go weeks to months w/o sx, then have an event.  No SI/HI.  D/w pt about cutting back caffeine, getting enough exercise, keeping a diary re: sx.   reasonable to continue Lexapro as is.  He agrees.  Okay for outpatient follow-up.

## 2017-08-25 NOTE — Assessment & Plan Note (Signed)
Per uro.  I'll defer.  He agrees.

## 2017-09-05 DIAGNOSIS — S29012A Strain of muscle and tendon of back wall of thorax, initial encounter: Secondary | ICD-10-CM | POA: Diagnosis not present

## 2017-09-05 DIAGNOSIS — G5631 Lesion of radial nerve, right upper limb: Secondary | ICD-10-CM | POA: Diagnosis not present

## 2017-09-27 ENCOUNTER — Other Ambulatory Visit: Payer: Self-pay | Admitting: Adult Health

## 2017-12-16 ENCOUNTER — Ambulatory Visit: Payer: 59 | Admitting: Adult Health

## 2017-12-16 ENCOUNTER — Encounter: Payer: Self-pay | Admitting: Adult Health

## 2017-12-16 VITALS — BP 128/82 | HR 80 | Ht 69.0 in | Wt 221.4 lb

## 2017-12-16 DIAGNOSIS — G5631 Lesion of radial nerve, right upper limb: Secondary | ICD-10-CM | POA: Diagnosis not present

## 2017-12-16 DIAGNOSIS — R569 Unspecified convulsions: Secondary | ICD-10-CM

## 2017-12-16 NOTE — Patient Instructions (Addendum)
Your Plan:  Continue Vimpat 150 mg twice a day Blood work today Referral to PT  If you have any seizure events please let us know.   Thank you for [facility]. I hope we have been able to provide you high quality care today.  You may receive a patient satisfaction survey over the next few weeks. We would appreciate your feedback and comments so that we may continue to improve ourselves and the health of our patients.

## 2017-12-16 NOTE — Addendum Note (Signed)
Addended by: Enedina FinnerMILLIKAN, Jamieson Hetland P on: 12/16/2017 11:14 AM   Modules accepted: Orders

## 2017-12-16 NOTE — Progress Notes (Addendum)
PATIENT: Mark Hernandez. DOB: January 28, 1961  REASON FOR VISIT: follow up HISTORY FROM: patient  HISTORY OF PRESENT ILLNESS: Today 12/16/17:  Mr. Muhammed is a 57 year old male with a history of seizures.  He returns today for follow-up.  He denies any seizure events.  He is able to complete all ADLs independently.  Denies any changes in his mood or behavior.  No change in his gait or balance.  He operates a Librarian, academic without difficulty.  He remains on Vimpat.  He reports in August he went to the ED.  Reports that he had muscle strain after kayaking and woke up with paralysis in his right hand.  He was diagnosed with radial nerve palsy.  He is given steroids but physical therapy was not ordered.  He states that he has had 90% improvement but still has trouble with flexion of his right wrist and extension of the digits.  He returns today for an evaluation  HISTORY 12/13/16 Mr. Bunner is a 57 year old male with a history of seizures.  He returns today for follow-up.  He is currently taking Vimpat and tolerating it well.  He denies any recent seizure events.  He operates a Librarian, academic without difficulty.  He is able to complete all ADLs independently.  He reports that he has retired and he feels that his stress level has improved.  He denies any changes with his mood or behavior.  Denies any changes with his gait or balance.  He returns today for an evaluation.   REVIEW OF SYSTEMS: Out of a complete 14 system review of symptoms, the patient complains only of the following symptoms, and all other reviewed systems are negative.  See HPI  ALLERGIES: Allergies  Allergen Reactions  . Penicillins Other (See Comments)    REACTION: as child    HOME MEDICATIONS: Outpatient Medications Prior to Visit  Medication Sig Dispense Refill  . escitalopram (LEXAPRO) 10 MG tablet Take 1 tablet (10 mg total) by mouth daily. 90 tablet 3  . ibuprofen (ADVIL,MOTRIN) 200 MG tablet Take 400 mg by mouth every 6  (six) hours as needed.    [name] VIMPAT 150 MG TABS TAKE 1 TABLET BY MOUTH TWICE A DAY AS NEEDED 180 tablet 1   No facility-administered medications prior to visit.     PAST MEDICAL HISTORY: Past Medical History:  Diagnosis Date  . Anxiety   . Arthritis    BOTH HANDS  . BPH (benign prostatic hypertrophy)   . Elevated PSA   . HLD (hyperlipidemia)   . Hx MRSA infection 2009  . Kidney stone   . Nocturia   . Over weight   . Rotator cuff tear   . Seizures (HCC)     PAST SURGICAL HISTORY: Past Surgical History:  Procedure Laterality Date  . COLONOSCOPY  2014?  . INGUINAL HERNIA REPAIR Left 02/17/2015   Procedure: LAPAROSCOPIC INGUINAL HERNIA;  Surgeon: Kieth Brightly, MD;  Location: [facility];  Service: General;  Laterality: Left;  . LITHOTRIPSY     x 2  . SHOULDER ARTHROSCOPY WITH OPEN ROTATOR CUFF REPAIR Left 09/23/2014   Procedure: SHOULDER ARTHROSCOPY , debridement, decompression, WITH  mini OPEN ROTATOR CUFF REPAIR;  Surgeon: Christena Flake, MD;  Location: [facility];  Service: Orthopedics;  Laterality: Left;  . SHOULDER SURGERY Bilateral    rotator cuff repair  . UMBILICAL HERNIA REPAIR N/A 02/17/2015   Procedure: LAPAROSCOPIC UMBILICAL HERNIA;  Surgeon: Kieth Brightly, MD;  Location: [facility];  Service: General;  Laterality: N/A;    FAMILY HISTORY: Family History  Problem Relation Age of Onset  . COPD Mother   . Prostate cancer Father   . Colon cancer Paternal Grandmother   . Kidney disease Paternal Uncle   . Kidney cancer Paternal Uncle     SOCIAL HISTORY: Social History   Socioeconomic History  . Marital status: Married    Spouse name: Rosalita Chessman  . Number of children: 2  . Years of education: 3  . Highest education level: Not on file  Occupational History  . Occupation: Engineer, structural   Social Needs  . Financial resource strain: Not on file  . Food insecurity:    Worry: Not on file    Inability: Not on file  . Transportation needs:    Medical: Not on  file    Non-medical: Not on file  Tobacco Use  . Smoking status: Never Smoker  . Smokeless tobacco: Former Neurosurgeon    Types: Chew  Substance and Sexual Activity  . Alcohol use: Yes    Comment: occ  . Drug use: No  . Sexual activity: Not on file  Lifestyle  . Physical activity:    Days per week: Not on file    Minutes per session: Not on file  . Stress: Not on file  Relationships  . Social connections:    Talks on phone: Not on file    Gets together: Not on file    Attends religious service: Not on file    Active member of club or organization: Not on file    Attends meetings of clubs or organizations: Not on file    Relationship status: Not on file  . Intimate partner violence:    Fear of current or ex partner: Not on file    Emotionally abused: Not on file    Physically abused: Not on file    Forced sexual activity: Not on file  Other Topics Concern  . 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  Generalized: Well developed, in no acute distress   Neurological examination  Mentation: Alert oriented to time, place, history taking. Follows all commands speech and language fluent Cranial nerve II-XII: Pupils were equal round reactive to light. Extraocular movements were full, visual field were full on confrontational test. Facial sensation and strength were normal. Uvula tongue midline. Head turning and shoulder shrug  were normal and symmetric. Motor: The motor testing reveals 5 over 5 strength of all 4 extremities.  Decreased flexion of the right wrist and extension of the digits on the right hand when wrist is in a flexed position. Sensory: Sensory testing is intact to soft touch  on all 4 extremities. No evidence of extinction is noted.  Coordination: Cerebellar testing reveals good finger-nose-finger and heel-to-shin bilaterally.  Gait and station: Gait is normal. Tandem gait is normal. Romberg is negative. No drift is seen. Reflexes: Deep tendon reflexes are symmetric and normal bilaterally.   DIAGNOSTIC DATA (LABS, IMAGING, TESTING) - I reviewed patient records, labs, notes, testing and imaging myself where available.  Lab Results  Component Value  Date   WBC 7.4 08/29/2015   HGB 12.9 (L) 08/29/2015   HCT 36.8 (L) 08/29/2015   MCV 85.4 08/29/2015   PLT 188 08/29/2015      Component Value Date/Time   NA 138 08/15/2017 0900   K 4.2 08/15/2017 0900   CL 105 08/15/2017 0900   CO2 29 08/15/2017 0900   GLUCOSE 97 08/15/2017 0900   BUN 20 08/15/2017 0900   CREATININE 0.89 08/15/2017 0900   CALCIUM 9.2 08/15/2017 0900   PROT 7.2 08/15/2017 0900   ALBUMIN 3.9 08/15/2017 0900   AST 16 08/15/2017 0900   ALT 13 08/15/2017 0900   ALKPHOS 68 08/15/2017 0900   BILITOT 0.4 08/15/2017 0900   GFRNONAA >60 08/29/2015 1840   GFRAA >60 08/29/2015 1840   Lab Results  Component Value Date   CHOL 186 08/15/2017   HDL 44.10 08/15/2017   LDLCALC 107 (H) 08/15/2017   TRIG 174.0 (H) 08/15/2017   CHOLHDL 4 08/15/2017   No results found for: HGBA1C No results found for: VITAMINB12 No results found for: TSH    ASSESSMENT AND PLAN 57 y.o. year old male  has a past medical history of Anxiety, Arthritis, BPH (benign prostatic hypertrophy), Elevated PSA, HLD (hyperlipidemia), MRSA infection (2009), Kidney stone, Nocturia, Over weight, Rotator cuff tear, and Seizures (HCC). here with :  1.  Seizures 2.  Radial nerve palsy  Overall the patient is doing well.  He will continue on Vimpat 150 mg twice a day.  He is advised that if he has any seizure events he should let us know.  He will be sent to occupational therapy for radial nerve palsy. he will follow-up in 1 year or  sooner if needed.  Butch PennyMegan Shedrick Sarli, MSN, NP-C 12/16/2017, 8:05 AM [facility] [address], [address] [place], [place] [address] [phone]

## 2017-12-16 NOTE — Progress Notes (Signed)
I have read the note, and I agree with the clinical assessment and plan.  Keimari Guttenberg K Zosia Lucchese   

## 2018-01-14 ENCOUNTER — Ambulatory Visit: Payer: 59 | Attending: Adult Health | Admitting: Occupational Therapy

## 2018-01-14 ENCOUNTER — Encounter: Payer: Self-pay | Admitting: Occupational Therapy

## 2018-01-14 DIAGNOSIS — M6281 Muscle weakness (generalized): Secondary | ICD-10-CM | POA: Insufficient documentation

## 2018-01-14 DIAGNOSIS — M25641 Stiffness of right hand, not elsewhere classified: Secondary | ICD-10-CM | POA: Diagnosis not present

## 2018-01-14 DIAGNOSIS — R29818 Other symptoms and signs involving the nervous system: Secondary | ICD-10-CM | POA: Diagnosis not present

## 2018-01-14 NOTE — Patient Instructions (Signed)
   Wrist Flexion: Resisted   With right palm up, 2 pound weight in hand, bend wrist up. Return slowly. Repeat 15 times per set.  Do 2 sessions per day.  Wrist Extension: Resisted   With right palm down, 2 pound weight in hand, bend wrist up. Return slowly. Repeat 10 times per set. Do 2 sessions per day.    Radial Deviation (Resistive)    Holding 2lb weight, bend wrist upward, with thumb pointing toward you. Hold 3 seconds. Repeat 15 times. Do 2 sessions per day.    **Hold wrist back at 45* angle with left hand, then try to straighten fingers.  10 times, hold 3 sec, 2x/day  **Place hand flat on table, try to lift each finger off table keeping palm down.      Finger and Thumb Extension (Resistive Putty)   With thumb and all fingers in center of putty donut, stretch out. Repeat 5-10 times. Do 1-2 sessions per day.      IP Fisting (Resistive Putty)   Keeping knuckles straight, bend fingertips to squeeze putty. Repeat 10 times. Do 1-2 sessions per day.  MP Flexion (Resistive Putty)   Bending only at large knuckles, press putty down against thumb. Keep fingertips straight. Repeat 10 times. Do 1-2 sessions per day.

## 2018-01-15 NOTE — Therapy (Signed)
[facility] [address] [place], [place], [address] Phone: [phone]   Fax:  [phone]  Occupational Therapy Evaluation  Patient Details  Name: Mark Hernandez. MRN: [id number] Date of Birth: 05-Dec-1960 Referring Provider (OT): Renelda Loma, NP   Encounter Date: 01/14/2018  OT End of Session - 01/15/18 1851    Visit Number  1    Number of Visits  5    Date for OT Re-Evaluation  02/28/18    Authorization Type  [employer], 20% coinsurance, 60 VL PT & ST     OT Start Time  [phone]    OT Stop Time  0917    OT Time Calculation (min)  44 min    Activity Tolerance  Patient tolerated treatment well    Behavior During Therapy  [facility] for tasks assessed/performed       Past Medical History:  Diagnosis Date  . Anxiety   . Arthritis    BOTH HANDS  . BPH (benign prostatic hypertrophy)   . Elevated PSA   . HLD (hyperlipidemia)   . Hx MRSA infection 2009  . Kidney stone   . Nocturia   . Over weight   . Rotator cuff tear   . Seizures (HCC)     Past Surgical History:  Procedure Laterality Date  . COLONOSCOPY  2014?  . INGUINAL HERNIA REPAIR Left 02/17/2015   Procedure: LAPAROSCOPIC INGUINAL HERNIA;  Surgeon: Kieth Brightly, MD;  Location: [facility] ORS;  Service: General;  Laterality: Left;  . LITHOTRIPSY     x 2  . SHOULDER ARTHROSCOPY WITH OPEN ROTATOR CUFF REPAIR Left 09/23/2014   Procedure: SHOULDER ARTHROSCOPY , debridement, decompression, WITH  mini OPEN ROTATOR CUFF REPAIR;  Surgeon: Christena Flake, MD;  Location: [facility] OR[facility];  Service: Orthopedics;  Laterality: Left;  . SHOULDER SURGERY Bilateral    rotator cuff repair  . UMBILICAL HERNIA REPAIR N/A 02/17/2015   Procedure: LAPAROSCOPIC UMBILICAL HERNIA;  Surgeon: Kieth Brightly, MD;  Location: [facility] OR[facility];  Service: General;  Laterality: N/A;    There were no vitals filed for this visit.  Subjective Assessment - 01/15/18 1918    Subjective   It was pretty bad  initially    Pertinent History  R radial nerve palsy (started 08/2017); hx of seizures (last was "a few years ago"), Anxiety, Arthritis, BPH (benign prostatic hypertrophy), HLD (hyperlipidemia), RTC repair x2 on R and x 1 on L    Limitations  none    Patient Stated Goals  improve weakness; few visits to find out what I can do        [facility] OT Assessment - 01/15/18 0001      Assessment   Medical Diagnosis  R radial nerve palsy    Referring Provider (OT)  Renelda Loma, NP    Onset Date/Surgical Date  --   August 2019, referral dated  12/16/17   Hand Dominance  Right    Prior Therapy  none      Precautions   Precautions  None      Balance Screen   Has the patient fallen in the past 6 months  No      Home  Environment   Family/patient expects to be discharged to:  Private residence    Lives With  Spouse   2 sons     Prior Function   Level of Independence  Independent    Vocation  Retired   IT sales professional   Leisure  --  fishing, hunting, lifting Weyerhaeuser Companyweights, kayaking, building things     ADL   ADL comments  pt reports difficulty reaching in pocket, using hammer in R hand, lifting things, hasn't lifted weights since this happended      Mobility   Mobility Status  Independent      Written Expression   Dominant Hand  Right    Handwriting  --   WNL per pt      Sensation   Additional Comments  denies numbness/tingling      Coordination   9 Hole Peg Test  Right;Left    Right 9 Hole Peg Test  19.34      AROM   Overall AROM   Deficits    Overall AROM Comments  finger flex/opposition WNL, wrist ext to 75* with MPs flexed (only able to ext wrist to 35* with MPs extended), MPs ext (isolated) WNL      Strength   Overall Strength  Deficits    Overall Strength Comments  LUE grossly WNL; R shoulder abduction 4+/5 (hx of RTC surgeries x2), R wrist flex/ext 4+/5       Hand Function   Right Hand Grip (lbs)  71    Right Hand Lateral Pinch  16 lbs    Right Hand 3 Point Pinch  19 lbs     Left Hand Grip (lbs)  88    Left Hand Lateral Pinch  19 lbs    Left 3 point pinch  19 lbs                      OT Education - 01/15/18 1905    Education Details  HEP--see pt instructions; OT eval results/POC    Person(s) Educated  Patient    Methods  Explanation;Demonstration;Verbal cues;Handout    Comprehension  Verbalized understanding;Returned demonstration;Verbal cues required          OT Long Term Goals - 01/15/18 1853      OT LONG TERM GOAL #1   Title  Pt will be independent with HEP.--check goals 02/28/18    Status  New      OT LONG TERM GOAL #2   Title  Pt will incr R grip strength by at least 10lbs to improve control with using hanmmer.    Status  New      OT LONG TERM GOAL #3   Title  Pt will demo at least 55* R wrist ext with full MP ext for improved grasp/release of objects for ADLs and retrieving items from pocket.    Baseline  wrist ext 75* with MP flex, but only 35* with MP ext    Status  New            Plan - 01/15/18 1857    Clinical Impression Statement  Pt is a 57 y.o. male with diagnosis of R radial nerve palsy (initially occured 08/2017 after kayaking/sleeping).  Pt with PMH that includes:  R radial nerve palsy (started 08/2017); hx of seizures (last was "a few years ago"), Anxiety, Arthritis, BPH (benign prostatic hypertrophy), HLD (hyperlipidemia), RTC repair x2 on R and x 1 on L.  Pt presents with decr strength, decr ROM, and decr dominant R hand functional use.  Pt would benefit from occupational therapy to address these deficits for improved RUE functional use, IADL performance, and to establish HEP.     Occupational Profile and client history currently impacting functional performance  Pt is indepdent and very active outdoors and enjoys building  things around the house.  Pt reports decr control/incr difficulty with IADLs/leisure activities.    Occupational performance deficits (Please refer to evaluation for details):   ADL's;IADL's;Leisure    Rehab Potential  Good    Current Impairments/barriers affecting progress:  pt with financial concerns    OT Frequency  1x / week    OT Duration  --   eval + 4 visits over 6 weeks prn to establish/update HEP (pt with financial concerns)   OT Treatment/Interventions  Self-care/ADL training;Moist Heat;Fluidtherapy;DME and/or AE instruction;Splinting;Contrast Bath;Therapeutic activities;Ultrasound;Therapeutic exercise;Neuromuscular education;Cryotherapy;Electrical Stimulation;Paraffin;Manual Therapy;Patient/family education;Passive range of motion    Plan  review and update HEP, schedule additional visits prn    Clinical Decision Making  Limited treatment options, no task modification necessary    Consulted and Agree with Plan of Care  Patient       Patient will benefit from skilled therapeutic intervention in order to improve the following deficits and impairments:  Decreased coordination, Decreased range of motion, Decreased endurance, Impaired UE functional use, Decreased strength, Decreased knowledge of use of DME, Decreased knowledge of precautions, Decreased activity tolerance  Visit Diagnosis: Muscle weakness (generalized)  Stiffness of right hand, not elsewhere classified  Other symptoms and signs involving the nervous system    Problem List Patient Active Problem List   Diagnosis Date Noted  . HLD (hyperlipidemia) 08/25/2017  . Anxiety 08/25/2017  . Pulmonary nodule 08/25/2017  . BPH with obstruction/lower urinary tract symptoms 03/18/2015  . Family history of prostate cancer 03/18/2015  . Routine general medical examination at a health care facility 12/31/2014  . Advance care planning 12/31/2014  . Injury of left shoulder 05/05/2013  . Seizures (HCC) 05/04/2013  . Syncope and collapse 05/04/2013    [facility] 01/15/2018, 7:20 PM  [facility] [address] [place], [place],  [address] Phone: [phone]   Fax:  [phone]  Name: Gerlad Pelzel. MRN: [id number] Date of Birth: [birth year]   Willa Frater, OTR/L [facility] [address] [place], [place]  [address] [phone] phone [phone] 01/15/18 7:20 PM

## 2018-01-28 ENCOUNTER — Ambulatory Visit: Payer: 59 | Admitting: Occupational Therapy

## 2018-01-28 ENCOUNTER — Encounter: Payer: Self-pay | Admitting: Occupational Therapy

## 2018-01-28 DIAGNOSIS — M6281 Muscle weakness (generalized): Secondary | ICD-10-CM

## 2018-01-28 DIAGNOSIS — M25641 Stiffness of right hand, not elsewhere classified: Secondary | ICD-10-CM

## 2018-01-28 DIAGNOSIS — R29818 Other symptoms and signs involving the nervous system: Secondary | ICD-10-CM

## 2018-01-28 NOTE — Patient Instructions (Signed)
Extension (Resistive Putty)    Straighten thumb inside putty loop anchored by fingers. Repeat 5-10 times. Do 2 sessions per day.    FINGERS: Extension (Putty)    Open hand and fingers to flatten putty. 5-10 reps per set, 2 sets per day     Extension (Assistive Putty)   Roll putty back and forth, being sure to use all fingertips. Repeat 3 times. Do 1-2 sessions per day.  Then pinch as below.   Palmar Pinch Strengthening (Resistive Putty)   Pinch putty between thumb and each fingertip in turn after rolling out        ** Place card on table.  Keep palm on table and extend your fingers to push card across table hard.

## 2018-01-28 NOTE — Therapy (Signed)
[facility] [address] [place], [place], [address] Phone: [phone]   Fax:  [phone]  Occupational Therapy Treatment  Patient Details  Name: Mark Hernandez. MRN: [id number] Date of Birth: 1960-09-10 Referring Provider (OT): Ernestine Mcmurray, NP   Encounter Date: 01/28/2018  OT End of Session - 01/28/18 0847    Visit Number  2    Number of Visits  5    Date for OT Re-Evaluation  02/28/18    Authorization Type  [employer], 20% coinsurance, 60 VL PT & ST     OT Start Time  [phone]    OT Stop Time  0913    OT Time Calculation (min)  38 min    Activity Tolerance  Patient tolerated treatment well    Behavior During Therapy  [facility] for tasks assessed/performed       Past Medical History:  Diagnosis Date  . Anxiety   . Arthritis    BOTH HANDS  . BPH (benign prostatic hypertrophy)   . Elevated PSA   . HLD (hyperlipidemia)   . Hx MRSA infection 2009  . Kidney stone   . Nocturia   . Over weight   . Rotator cuff tear   . Seizures ([place])     Past Surgical History:  Procedure Laterality Date  . COLONOSCOPY  2014?  . INGUINAL HERNIA REPAIR Left 02/17/2015   Procedure: LAPAROSCOPIC INGUINAL HERNIA;  Surgeon: Christene Lye, MD;  Location: [facility] ORS;  Service: General;  Laterality: Left;  . LITHOTRIPSY     x 2  . SHOULDER ARTHROSCOPY WITH OPEN ROTATOR CUFF REPAIR Left 09/23/2014   Procedure: SHOULDER ARTHROSCOPY , debridement, decompression, WITH  mini OPEN ROTATOR CUFF REPAIR;  Surgeon: Corky Mull, MD;  Location: [facility] ORS;  Service: Orthopedics;  Laterality: Left;  . SHOULDER SURGERY Bilateral    rotator cuff repair  . UMBILICAL HERNIA REPAIR N/A 02/17/2015   Procedure: LAPAROSCOPIC UMBILICAL HERNIA;  Surgeon: Christene Lye, MD;  Location: [facility] ORS;  Service: General;  Laterality: N/A;    There were no vitals filed for this visit.  Subjective Assessment - 01/28/18 0847    Subjective   Strength is improving.   I was able to kayak for the first time since this happened.    Pertinent History  R radial nerve palsy (started 08/2017); hx of seizures (last was "a few years ago"), Anxiety, Arthritis, BPH (benign prostatic hypertrophy), HLD (hyperlipidemia), RTC repair x2 on R and x 1 on L    Limitations  none    Patient Stated Goals  improve weakness; few visits to find out what I can do    Currently in Pain?  No/denies        Coordination activities:  Ball push ups (mod-max difficulty with technique to encourage MP ext, so discontinued), tossing ball with finger ext with min difficulty, rotating ball in fingertips with min difficulty, rotating 2 golf balls with min difficulty.       OT Education - 01/28/18 1519    Education Details  Reviewed initial HEP and instructed pt in additional exercises--see pt instructions for updates    Person(s) Educated  Patient    Methods  Explanation;Demonstration;Verbal cues;Handout    Comprehension  Verbalized understanding;Returned demonstration;Verbal cues required          OT Long Term Goals - 01/28/18 1521      OT LONG TERM GOAL #1   Title  Pt will be independent with HEP.--check goals 02/28/18  Status  Achieved      OT LONG TERM GOAL #2   Title  Pt will incr R grip strength by at least 10lbs to improve control with using hanmmer.    Status  Not Met   01/28/18:  not fully met.  Pt reports that hammer use is about the same, did not reassess grip strength due to early d/c     OT LONG TERM GOAL #3   Title  Pt will demo at least 55* R wrist ext with full MP ext for improved grasp/release of objects for ADLs and retrieving items from pocket.    Baseline  wrist ext 75* with MP flex, but only 35* with MP ext    Status  Not Met   01/28/18:  45* with MP ext           Plan - 01/28/18 1520    Clinical Impression Statement  Pt has made good progress with finger extension with wrist ext and verbalized understanding of HEP for continued improvement.  Pt  reports that he is happy with progress and would like to d/c at this time.    Occupational Profile and client history currently impacting functional performance  Pt is indepdent and very active outdoors and enjoys building things around the house.  Pt reports decr control/incr difficulty with IADLs/leisure activities.    Occupational performance deficits (Please refer to evaluation for details):  ADL's;IADL's;Leisure    Rehab Potential  Good    Current Impairments/barriers affecting progress:  pt with financial concerns    OT Frequency  1x / week    OT Duration  --   eval + 4 visits over 6 weeks prn to establish/update HEP (pt with financial concerns)   OT Treatment/Interventions  Self-care/ADL training;Moist Heat;Fluidtherapy;DME and/or AE instruction;Splinting;Contrast Bath;Therapeutic activities;Ultrasound;Therapeutic exercise;Neuromuscular education;Cryotherapy;Electrical Stimulation;Paraffin;Manual Therapy;Patient/family education;Passive range of motion    Plan  d/c OT, pt to continue with HEP    Clinical Decision Making  Limited treatment options, no task modification necessary    Consulted and Agree with Plan of Care  Patient       Patient will benefit from skilled therapeutic intervention in order to improve the following deficits and impairments:  Decreased coordination, Decreased range of motion, Decreased endurance, Impaired UE functional use, Decreased strength, Decreased knowledge of use of DME, Decreased knowledge of precautions, Decreased activity tolerance  Visit Diagnosis: Muscle weakness (generalized)  Stiffness of right hand, not elsewhere classified  Other symptoms and signs involving the nervous system    Problem List Patient Active Problem List   Diagnosis Date Noted  . HLD (hyperlipidemia) 08/25/2017  . Anxiety 08/25/2017  . Pulmonary nodule 08/25/2017  . BPH with obstruction/lower urinary tract symptoms 03/18/2015  . Family history of prostate cancer  03/18/2015  . Routine general medical examination at a health care facility 12/31/2014  . Advance care planning 12/31/2014  . Injury of left shoulder 05/05/2013  . Seizures ([place]) 05/04/2013  . Syncope and collapse 05/04/2013     OCCUPATIONAL THERAPY DISCHARGE SUMMARY  Visits from Start of Care: 2  Current functional level related to goals / functional outcomes: See above   Remaining deficits: decr strength, decr full composite finger/wrist ext   Education / Equipment: Pt instructed in HEP and verbalized understanding.  Plan: Patient agrees to discharge.  Patient goals were partially met. Patient is being discharged due to being pleased with the current functional level.  Pt plans to continue with HEP. ?????      [facility] 01/28/2018,  3:29 PM  [facility] [address] [place], [place], [address] Phone: [phone]   Fax:  [phone]  Name: Karla Vines. MRN: [id number] Date of Birth: 05-08-1960   Vianne Bulls, OTR/L [facility] [address] [place], [place]  [address] [phone] phone [phone] 01/28/18 3:29 PM

## 2018-03-11 ENCOUNTER — Other Ambulatory Visit: Payer: Self-pay

## 2018-03-11 DIAGNOSIS — N138 Other obstructive and reflux uropathy: Secondary | ICD-10-CM

## 2018-03-11 DIAGNOSIS — N401 Enlarged prostate with lower urinary tract symptoms: Principal | ICD-10-CM

## 2018-03-12 ENCOUNTER — Other Ambulatory Visit: Payer: 59

## 2018-03-12 DIAGNOSIS — N401 Enlarged prostate with lower urinary tract symptoms: Principal | ICD-10-CM

## 2018-03-12 DIAGNOSIS — N138 Other obstructive and reflux uropathy: Secondary | ICD-10-CM | POA: Diagnosis not present

## 2018-03-13 LAB — PSA: PROSTATE SPECIFIC AG, SERUM: 0.9 ng/mL (ref 0.0–4.0)

## 2018-03-18 NOTE — Progress Notes (Signed)
03/19/2017 9:24 AM   Mark Hernandez. 1960/02/02 [id number]  Referring provider: Joaquim Nam, MD [address] [place], [place] [address]  Chief Complaint  Patient presents with  . Follow-up    HPI: Mark Hernandez. is a 58 y.o. male Caucasian who presents today for an annual prostate exam.  Patient reports some intermittent pain in the groin/scrotum area, that feels like a hard strain, which started six months ago.  Patient denies any bulge or injury.  Patient says that right now it does not hurt but is not comfortable.  Patient admits that he is very active.  Patient says their urinary symptoms are stable.  BPH WITH LUTS  (prostate and/or bladder) IPSS score: 11/2  Previous score: 15/2  Major complaint(s):  Nocturia 2 x several years. Denies any dysuria, hematuria or suprapubic pain.    Denies any recent fevers, chills, nausea or vomiting.  He has a family history of PCa, with father and paternal father having had prostate cancer.  IPSS    Row Name 03/19/18 0900         International Prostate Symptom Score   How often have you had the sensation of not emptying your bladder?  Less than half the time     How often have you had to urinate less than every two hours?  Less than half the time     How often have you found you stopped and started again several times when you urinated?  Less than half the time     How often have you found it difficult to postpone urination?  Less than half the time     How often have you had a weak urinary stream?  Less than half the time     How often have you had to strain to start urination?  Not at All     How many times did you typically get up at night to urinate?  1 Time     Total IPSS Score  11       Quality of Life due to urinary symptoms   If you were to spend the rest of your life with your urinary condition just the way it is now how would you feel about that?  Mostly Satisfied        Score:  1-7 Mild 8-19  Moderate 20-35 Severe  PMH: Past Medical History:  Diagnosis Date  . Anxiety   . Arthritis    BOTH HANDS  . BPH (benign prostatic hypertrophy)   . Elevated PSA   . HLD (hyperlipidemia)   . Hx MRSA infection 2009  . Kidney stone   . Nocturia   . Over weight   . Rotator cuff tear   . Seizures [facility])     Surgical History: Past Surgical History:  Procedure Laterality Date  . COLONOSCOPY  2014?  . INGUINAL HERNIA REPAIR Left 02/17/2015   Procedure: LAPAROSCOPIC INGUINAL HERNIA;  Surgeon: Kieth Brightly, MD;  Location: [facility] OR[facility];  Service: General;  Laterality: Left;  . LITHOTRIPSY     x 2  . SHOULDER ARTHROSCOPY WITH OPEN ROTATOR CUFF REPAIR Left 09/23/2014   Procedure: SHOULDER ARTHROSCOPY , debridement, decompression, WITH  mini OPEN ROTATOR CUFF REPAIR;  Surgeon: Christena Flake, MD;  Location: [facility] OR[facility];  Service: Orthopedics;  Laterality: Left;  . SHOULDER SURGERY Bilateral    rotator cuff repair  . UMBILICAL HERNIA REPAIR N/A 02/17/2015   Procedure: LAPAROSCOPIC UMBILICAL HERNIA;  Surgeon: Kieth BrightlySeeplaputhur G Sankar, MD;  Location: [facility] ORS;  Service: General;  Laterality: N/A;    Home Medications:  Allergies as of 03/19/2018      Reactions   Penicillins Other (See Comments)   REACTION: as child      Medication List       Accurate as of March 19, 2018  9:24 AM. Always use your most recent med list.        escitalopram 10 MG tablet Commonly known as:  LEXAPRO Take 1 tablet (10 mg total) by mouth daily.   ibuprofen 200 MG tablet Commonly known as:  ADVIL,MOTRIN Take 400 mg by mouth every 6 (six) hours as needed.   VIMPAT 150 MG Tabs Generic drug:  Lacosamide TAKE 1 TABLET BY MOUTH TWICE A DAY AS NEEDED       Allergies:  Allergies  Allergen Reactions  . Penicillins Other (See Comments)    REACTION: as child    Family History: Family History  Problem Relation Age of Onset  . COPD Mother   . Prostate cancer Father   . Colon cancer Paternal  Grandmother   . Kidney disease Paternal Uncle   . Kidney cancer Paternal Uncle     Social History:  reports that he has never smoked. He quit smokeless tobacco use about 13 years ago.  His smokeless tobacco use included chew. He reports current alcohol use. He reports that he does not use drugs.  ROS: UROLOGY Frequent Urination?: No Hard to postpone urination?: No Burning/pain with urination?: No Get up at night to urinate?: Yes Leakage of urine?: No Urine stream starts and stops?: No Trouble starting stream?: No Do you have to strain to urinate?: No Blood in urine?: No Urinary tract infection?: No Sexually transmitted disease?: No Injury to kidneys or bladder?: No Painful intercourse?: No Weak stream?: No Erection problems?: No Penile pain?: No  Gastrointestinal Nausea?: No Vomiting?: No Indigestion/heartburn?: No Diarrhea?: No Constipation?: No  Constitutional Fever: No Night sweats?: No Weight loss?: No Fatigue?: No  Skin Skin rash/lesions?: No Itching?: No  Eyes Blurred vision?: No Double vision?: No  Ears/Nose/Throat Sore throat?: No Sinus problems?: Yes  Hematologic/Lymphatic Swollen glands?: No Easy bruising?: No  Cardiovascular Leg swelling?: No Chest pain?: No  Respiratory Cough?: No Shortness of breath?: No  Endocrine Excessive thirst?: No  Musculoskeletal Back pain?: No Joint pain?: No  Neurological Headaches?: No Dizziness?: No  Psychologic Depression?: No Anxiety?: No  Physical Exam: BP 139/78   Pulse 83   Ht 5\' 9"  (1.753 m)   Wt 215 lb (97.5 kg)   BMI 31.75 kg/m   Constitutional:  Well nourished. Alert and oriented, No acute distress. Cardiovascular: No clubbing, cyanosis, or edema. Respiratory: Normal respiratory effort, no increased work of breathing. GI: Abdomen is soft, non tender, non distended, no abdominal masses. Liver and spleen not palpable.  Umbilical hernia appreciated.  Stool sample for occult testing  is not indicated. Possible left inguinal hernia.  GU: No CVA tenderness.  No bladder fullness or masses.  Patient with circumcised phallus.  Urethral meatus is patent.  No penile discharge. No penile lesions or rashes. Scrotum without lesions, cysts, rashes and/or edema.  Testicles are located scrotally bilaterally.  Bilateral hydrocyles.  No masses are appreciated in the testicles.  Left and right epididymis are normal. Rectal: Patient with normal sphincter tone.  Anus and perineum without scarring or rashes.  No rectal masses are appreciated. Only the apex of the prostate could be palpated, no nodules  are appreciated.  Seminal vesicles could not be palpated. Skin: No rashes, bruises or suspicious lesions. Neurologic: Grossly intact, no focal deficits, moving all 4 extremities. Psychiatric: Normal mood and affect.  Laboratory Data:  Lab Results  Component Value Date   CREATININE 0.89 08/15/2017      Component Value Date/Time   CHOL 186 08/15/2017 0900   HDL 44.10 08/15/2017 0900   CHOLHDL 4 08/15/2017 0900   VLDL 34.8 08/15/2017 0900   LDLCALC 107 (H) 08/15/2017 0900   Lab Results  Component Value Date   AST 16 08/15/2017   Lab Results  Component Value Date   ALT 13 08/15/2017   PSA history  1.2 ng/mL on 03/05/2012  0.8 ng/mL on 05/01/2012  1.0 ng/mL on 03/05/2013  0.8 ng/mL on 03/05/2014  1.0 ng/mL on 03/18/2015  1.7 ng/mL on 03/12/2016  1.0 ng/mL on 03/12/2017  0.9 ng/mL on 03/12/2018  I have reviewed the labs.  Assessment & Plan:    1. BPH with LUTS - IPSS score is 11/2, it is improving - Continue conservative management, avoiding bladder irritants and timed voiding's - RTC in 12 months for IPSS, PSA and exam   2. Nocturia: - Patient is getting up 2 times a night on an intermittent basis.  It is not bothersome to him at this time. We will continue to monitor. He'll return in 1 year for I PSS score.  3. Family history of prostate cancer: - Patient's father and  paternal grandfather had been diagnosed with prostate cancer.  Patient will have yearly prostate exams and PSA's.  4. Suspected inguinal hernia - discussed with patient having a scrotal ultrasound performed at this time, but he would like to defer unless the pain becomes more constant or more frequent - Patient says the pain is tolerable at this point, though he once had it for a whole week and was nauseated - Follow up in a year, if patient does not decide to follow up sooner due to it worsening  Return in about 1 year (around 03/20/2019) for IPSS, PSA and exam.  Michiel Cowboy, [facility] [address] [place], [place] [address] [phone]  I, Duanne Moron, am acting as a Neurosurgeon for [employer], PA-C.   I have reviewed the above documentation for accuracy and completeness, and I agree with the above.    Michiel Cowboy, PA-C

## 2018-03-19 ENCOUNTER — Ambulatory Visit: Payer: 59 | Admitting: Urology

## 2018-03-19 ENCOUNTER — Encounter: Payer: Self-pay | Admitting: Urology

## 2018-03-19 VITALS — BP 139/78 | HR 83 | Ht 69.0 in | Wt 215.0 lb

## 2018-03-19 DIAGNOSIS — N401 Enlarged prostate with lower urinary tract symptoms: Secondary | ICD-10-CM | POA: Diagnosis not present

## 2018-03-19 DIAGNOSIS — N138 Other obstructive and reflux uropathy: Secondary | ICD-10-CM | POA: Diagnosis not present

## 2018-03-19 LAB — BLADDER SCAN AMB NON-IMAGING: SCAN RESULT: 16

## 2018-03-27 ENCOUNTER — Other Ambulatory Visit: Payer: Self-pay | Admitting: Adult Health

## 2018-08-20 ENCOUNTER — Other Ambulatory Visit (INDEPENDENT_AMBULATORY_CARE_PROVIDER_SITE_OTHER): Payer: 59

## 2018-08-20 ENCOUNTER — Other Ambulatory Visit: Payer: Self-pay | Admitting: Family Medicine

## 2018-08-20 ENCOUNTER — Other Ambulatory Visit: Payer: Self-pay

## 2018-08-20 DIAGNOSIS — E785 Hyperlipidemia, unspecified: Secondary | ICD-10-CM

## 2018-08-20 LAB — COMPREHENSIVE METABOLIC PANEL
ALT: 11 U/L (ref 0–53)
AST: 15 U/L (ref 0–37)
Albumin: 4.2 g/dL (ref 3.5–5.2)
Alkaline Phosphatase: 65 U/L (ref 39–117)
BUN: 23 mg/dL (ref 6–23)
CO2: 26 mEq/L (ref 19–32)
Calcium: 9.2 mg/dL (ref 8.4–10.5)
Chloride: 107 mEq/L (ref 96–112)
Creatinine, Ser: 0.82 mg/dL (ref 0.40–1.50)
GFR: 96.59 mL/min (ref >60.00)
Glucose, Bld: 96 mg/dL (ref 70–99)
Potassium: 4.3 mEq/L (ref 3.5–5.1)
Sodium: 139 mEq/L (ref 135–145)
Total Bilirubin: 0.5 mg/dL (ref 0.2–1.2)
Total Protein: 6.8 g/dL (ref 6.0–8.3)

## 2018-08-20 LAB — LIPID PANEL
Cholesterol: 190 mg/dL (ref 0–200)
HDL: 44.8 mg/dL (ref >39.00)
LDL Cholesterol: 122 mg/dL — ABNORMAL HIGH (ref 0–99)
NonHDL: 145.45
Total CHOL/HDL Ratio: 4
Triglycerides: 117 mg/dL (ref 0.0–149.0)
VLDL: 23.4 mg/dL (ref 0.0–40.0)

## 2018-08-25 ENCOUNTER — Encounter: Payer: Self-pay | Admitting: Family Medicine

## 2018-08-25 ENCOUNTER — Ambulatory Visit (INDEPENDENT_AMBULATORY_CARE_PROVIDER_SITE_OTHER): Payer: 59 | Admitting: Family Medicine

## 2018-08-25 VITALS — BP 132/84 | HR 86 | Temp 99.0°F | Ht 69.0 in | Wt 219.0 lb

## 2018-08-25 DIAGNOSIS — Z7189 Other specified counseling: Secondary | ICD-10-CM

## 2018-08-25 DIAGNOSIS — R569 Unspecified convulsions: Secondary | ICD-10-CM

## 2018-08-25 DIAGNOSIS — F419 Anxiety disorder, unspecified: Secondary | ICD-10-CM

## 2018-08-25 DIAGNOSIS — Z0000 Encounter for general adult medical examination without abnormal findings: Secondary | ICD-10-CM

## 2018-08-25 MED ORDER — ESCITALOPRAM OXALATE 10 MG PO TABS: 10.0000 mg | ORAL_TABLET | Freq: Every day | ORAL | 3 refills | Status: DC

## 2018-08-25 NOTE — Patient Instructions (Signed)
Keep working on diet and exercise.  I would get a flu shot each fall.   Take care.  Glad to see you.  Update me as needed.

## 2018-08-25 NOTE — Progress Notes (Signed)
CPE- See plan.  Routine anticipatory guidance given to patient.  See health maintenance.  The possibility exists that previously documented standard health maintenance information may have been brought forward from a previous encounter into this note.  If needed, that same information has been updated to reflect the current situation based on today's encounter.    Tetanus done at work, 2011 Flu done yearly, d/w pt.  PNA due at 65.  shingles due at 39.   PSA screening- done by uro, I'll defer, d/w pt.  Colonoscopy done prev, 2014. Was told 10 year f/u. Done per [facility].  Living will d/w pt. Wife designated if patient were incapacitated.  Diet and exercise d/w pt. Encouraged both.  HCV and HIV screening neg.  Labs d/w pt.   SZ per neuro, no events.  No ADE on med.  Compliant.  No restrictions at this point. His grip is "pretty good" with some improvement from time and OT.    Mood d/w pt.  In SI/HI.  Anxiety is generally well controlled with occ sx noted during periods of high stress/high anticipation/sleep deprivation.  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    PMH and SH reviewed  Meds, vitals, and allergies reviewed.   ROS: Per HPI.  Unless specifically indicated otherwise in HPI, the patient denies:  General: fever. Eyes: acute vision changes ENT: sore throat Cardiovascular: chest pain Respiratory: SOB GI: vomiting GU: dysuria Musculoskeletal: acute back pain Derm: acute rash Neuro: acute motor dysfunction Psych: worsening mood Endocrine: polydipsia Heme: bleeding Allergy: hayfever  GEN: nad, alert and oriented HEENT: ncat NECK: supple w/o LA CV: rrr. PULM: ctab, no inc wob ABD: soft, +bs EXT: no edema SKIN: no acute rash

## 2018-08-27 NOTE — Assessment & Plan Note (Signed)
Tetanus done at work, 2011 Flu done yearly, d/w pt.  PNA due at 65.  shingles due at 70.   PSA screening- done by uro, I'll defer, d/w pt.  Colonoscopy done prev, 2014. Was told 10 year f/u. Done per [facility].  Living will d/w pt. Wife designated if patient were incapacitated.  Diet and exercise d/w pt. Encouraged both.  HCV and HIV screening neg.  Labs d/w pt.

## 2018-08-27 NOTE — Assessment & Plan Note (Signed)
Mood d/w pt.  In SI/HI.  Anxiety is generally well controlled with occ sx noted during periods of high stress/high anticipation/sleep deprivation.  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   Continue as is.  He agrees.

## 2018-08-27 NOTE — Assessment & Plan Note (Signed)
SZ per neuro, no events.  No ADE on med.  Compliant.  No restrictions at this point. His grip is "pretty good" with some improvement from time and OT.   I will defer.  He agrees.

## 2018-08-27 NOTE — Assessment & Plan Note (Signed)
Living will d/w pt.  Wife designated if patient were incapacitated.   ?

## 2018-09-22 ENCOUNTER — Other Ambulatory Visit: Payer: Self-pay | Admitting: Neurology

## 2018-09-24 NOTE — Telephone Encounter (Signed)
[place] Database Verified LR: 06-27-2018 Qty: 180 Pending appointment: 12-18-2018

## 2018-09-24 NOTE — Telephone Encounter (Signed)
I faxed the signed prescription to [facility]. Received a receipt of confirmation.

## 2018-10-24 ENCOUNTER — Encounter: Payer: Self-pay | Admitting: Family Medicine

## 2018-10-24 ENCOUNTER — Ambulatory Visit (INDEPENDENT_AMBULATORY_CARE_PROVIDER_SITE_OTHER)
Admission: RE | Admit: 2018-10-24 | Discharge: 2018-10-24 | Disposition: A | Discharge status: Home / Self Care | Payer: 59 | Source: Ambulatory Visit | Attending: Family Medicine | Admitting: Family Medicine

## 2018-10-24 ENCOUNTER — Ambulatory Visit: Payer: 59 | Admitting: Family Medicine

## 2018-10-24 ENCOUNTER — Other Ambulatory Visit: Payer: Self-pay

## 2018-10-24 VITALS — BP 130/78 | HR 67 | Temp 97.9°F | Ht 69.0 in | Wt 220.4 lb

## 2018-10-24 DIAGNOSIS — M25561 Pain in right knee: Secondary | ICD-10-CM

## 2018-10-24 MED ORDER — MELOXICAM 15 MG PO TABS: 15.0000 mg | ORAL_TABLET | Freq: Every day | ORAL | 1 refills | Status: DC

## 2018-10-24 NOTE — Progress Notes (Signed)
R knee pain.  He had some pain a few years ago, after crossing his legs and propping his feet up.  It took a while to get better.  Then he had the same trouble again recently after propping his feet up.  Anterior knee pain.  It doesn't truly lock but is really sore on standing, some better with more walking.  Pain squatting.  Knee can get puffy, not bruised.  No redness.  advil and ice help temporarily.   L knee sore but that was likely from compensation from R knee pain, that felt different from his right knee pain  Meds, vitals, and allergies reviewed.   ROS: Per HPI unless specifically indicated in ROS section   nad ncat R knee with normal range of motion without locking but mild crepitus noted on the medial side. Medial right knee is slightly puffy without bruising. Medial joint line is slightly tender. Popliteal fossa not tender. Patella not tender to palpation with manipulation. ACL, MCL, LCL all feel solid. Able to bear weight.

## 2018-10-24 NOTE — Patient Instructions (Signed)
Go to the lab on the way out.  We'll contact you with your xray report. Take meloxicam with food.  Ice as needed.  Take care.  Glad to see you.

## 2018-10-26 DIAGNOSIS — M25561 Pain in right knee: Secondary | ICD-10-CM | POA: Insufficient documentation

## 2018-10-26 NOTE — Assessment & Plan Note (Signed)
Likely with arthritic changes.  Start meloxicam with food, routine cautions given.  Check plain films today.  Ice as needed.  Rationale for treatment discussed with patient.  He agrees.  Update me as needed

## 2018-11-03 ENCOUNTER — Telehealth: Payer: Self-pay | Admitting: Family Medicine

## 2018-11-03 DIAGNOSIS — M25569 Pain in unspecified knee: Secondary | ICD-10-CM

## 2018-11-03 NOTE — Telephone Encounter (Signed)
Patient saw Dr.Duncan recently for right knee.  Patient was told if the pain didn't improve, to call and he could be referred to orthopaedics.  Patient said he'd like to be referred to orthopaedics.  Patient prefers [facility].  Patient can go anytime.

## 2018-11-04 NOTE — Telephone Encounter (Signed)
I put in the order.  Thanks. 

## 2018-12-18 ENCOUNTER — Encounter: Payer: Self-pay | Admitting: Adult Health

## 2018-12-18 ENCOUNTER — Ambulatory Visit: Payer: 59 | Admitting: Adult Health

## 2018-12-18 ENCOUNTER — Other Ambulatory Visit: Payer: Self-pay

## 2018-12-18 VITALS — BP 126/78 | HR 72 | Temp 97.9°F | Ht 69.0 in | Wt 218.6 lb

## 2018-12-18 DIAGNOSIS — R569 Unspecified convulsions: Secondary | ICD-10-CM

## 2018-12-18 NOTE — Progress Notes (Addendum)
PATIENT: Mark Hernandez. DOB: 12/11/1960  REASON FOR VISIT: follow up HISTORY FROM: patient  HISTORY OF PRESENT ILLNESS: Today 12/18/18: Mark Hernandez is a 58 year old male with a history of seizures.  He returns today for follow-up.  He continues on Vimpat 150 mg twice a day.  He denies any seizure events.  He is able to complete all ADLs independently.  He operates a Librarian, academic without difficulty.  No change in his mood or behavior.  No change in his gait or balance.  Overall he feels that he is doing well.  He returns today for an evaluation  HISTORY 12/16/17:  Mark Hernandez is a 58 year old male with a history of seizures.  He returns today for follow-up.  He denies any seizure events.  He is able to complete all ADLs independently.  Denies any changes in his mood or behavior.  No change in his gait or balance.  He operates a Librarian, academic without difficulty.  He remains on Vimpat.  He reports in August he went to the ED.  Reports that he had muscle strain after kayaking and woke up with paralysis in his right hand.  He was diagnosed with radial nerve palsy.  He is given steroids but physical therapy was not ordered.  He states that he has had 90% improvement but still has trouble with flexion of his right wrist and extension of the digits.  He returns today for an evaluation   REVIEW OF SYSTEMS: Out of a complete 14 system review of symptoms, the patient complains only of the following symptoms, and all other reviewed systems are negative.  See HPI  ALLERGIES: Allergies  Allergen Reactions  . Penicillins Other (See Comments)    REACTION: as child    HOME MEDICATIONS: Outpatient Medications Prior to Visit  Medication Sig Dispense Refill  . escitalopram (LEXAPRO) 10 MG tablet Take 1 tablet (10 mg total) by mouth daily. 90 tablet 3  . Lacosamide (VIMPAT) 150 MG TABS Take 1 tablet (150 mg total) by mouth 2 (two) times daily. 180 tablet 1  . meloxicam (MOBIC) 15 MG tablet Take 1  tablet (15 mg total) by mouth daily. With food. 30 tablet 1   No facility-administered medications prior to visit.     PAST MEDICAL HISTORY: Past Medical History:  Diagnosis Date  . Anxiety   . Arthritis    BOTH HANDS  . BPH (benign prostatic hypertrophy)   . Elevated PSA   . HLD (hyperlipidemia)   . Hx MRSA infection 2009  . Kidney stone   . Nocturia   . Over weight   . Rotator cuff tear   . Seizures ([facility])     PAST SURGICAL HISTORY: Past Surgical History:  Procedure Laterality Date  . COLONOSCOPY  2014?  . INGUINAL HERNIA REPAIR Left 02/17/2015   Procedure: LAPAROSCOPIC INGUINAL HERNIA;  Surgeon: Kieth Brightly, MD;  Location: [facility] ORS;  Service: General;  Laterality: Left;  . LITHOTRIPSY     x 2  . SHOULDER ARTHROSCOPY WITH OPEN ROTATOR CUFF REPAIR Left 09/23/2014   Procedure: SHOULDER ARTHROSCOPY , debridement, decompression, WITH  mini OPEN ROTATOR CUFF REPAIR;  Surgeon: Christena Flake, MD;  Location: [facility] OR[facility];  Service: Orthopedics;  Laterality: Left;  . SHOULDER SURGERY Bilateral    rotator cuff repair  . UMBILICAL HERNIA REPAIR N/A 02/17/2015   Procedure: LAPAROSCOPIC UMBILICAL HERNIA;  Surgeon: Kieth Brightly, MD;  Location: [facility] ORS;  Service: General;  Laterality: N/A;  FAMILY HISTORY: Family History  Problem Relation Age of Onset  . COPD Mother   . Prostate cancer Father   . Colon cancer Paternal Grandmother   . Kidney disease Paternal Uncle   . Kidney cancer Paternal Uncle     SOCIAL HISTORY: Social History   Socioeconomic History  . Marital status: Married    Spouse name: [name]  . Number of children: 2  . Years of education: 2714  . Highest education level: Not on file  Occupational History  . Occupation: Engineer, structuralire Chief   Social Needs  . Financial resource strain: Not on file  . Food insecurity    Worry: Not on file    Inability: Not on file  . Transportation needs    Medical: Not on file    Non-medical: Not on file  Tobacco Use   . Smoking status: Never Smoker  . Smokeless tobacco: Former NeurosurgeonUser    Types: Chew  Substance and Sexual Activity  . Alcohol use: Yes    Comment: occ  . Drug use: No  . Sexual activity: Not on file  Lifestyle  . Physical activity    Days per week: Not on file    Minutes per session: Not on file  . Stress: Not on file  Relationships  . Social Musicianconnections    Talks on phone: Not on file    Gets together: Not on file    Attends religious service: Not on file    Active member of club or organization: Not on file    Attends meetings of clubs or organizations: Not on file    Relationship status: Not on file  . Intimate partner violence    Fear of current or ex partner: Not on file    Emotionally abused: Not on file    Physically abused: Not on file    Forced sexual activity: Not on file  Other Topics Concern  . 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  Generalized: Well developed, in no acute distress   Neurological examination  Mentation: Alert oriented to time, place, history taking. Follows all commands speech and language fluent Cranial nerve II-XII: Pupils were equal round reactive to light. Extraocular movements were full, visual field were full on confrontational test. Head turning and shoulder shrug  were normal and symmetric. Motor: The motor testing reveals 5 over 5 strength of all 4 extremities. Good symmetric motor tone is noted throughout.  Sensory: Sensory testing is intact to soft touch on all 4 extremities. No evidence of extinction is noted.  Coordination: Cerebellar testing reveals good finger-nose-finger and heel-to-shin  bilaterally.  Gait and station: Gait is normal.  Reflexes: Deep tendon reflexes are symmetric and normal bilaterally.   DIAGNOSTIC DATA (LABS, IMAGING, TESTING) - I reviewed patient records, labs, notes, testing and imaging myself where available.  Lab Results  Component Value Date   WBC 7.4 08/29/2015   HGB 12.9 (L) 08/29/2015   HCT 36.8 (L) 08/29/2015   MCV 85.4 08/29/2015   PLT 188 08/29/2015      Component  Value Date/Time   NA 139 08/20/2018 0803   K 4.3 08/20/2018 0803   CL 107 08/20/2018 0803   CO2 26 08/20/2018 0803   GLUCOSE 96 08/20/2018 0803   BUN 23 08/20/2018 0803   CREATININE 0.82 08/20/2018 0803   CALCIUM 9.2 08/20/2018 0803   PROT 6.8 08/20/2018 0803   ALBUMIN 4.2 08/20/2018 0803   AST 15 08/20/2018 0803   ALT 11 08/20/2018 0803   ALKPHOS 65 08/20/2018 0803   BILITOT 0.5 08/20/2018 0803   GFRNONAA >60 08/29/2015 1840   GFRAA >60 08/29/2015 1840   Lab Results  Component Value Date   CHOL 190 08/20/2018   HDL 44.80 08/20/2018   LDLCALC 122 (H) 08/20/2018   TRIG 117.0 08/20/2018   CHOLHDL 4 08/20/2018      ASSESSMENT AND PLAN 58 y.o. year old male  has a past medical history of Anxiety, Arthritis, BPH (benign prostatic hypertrophy), Elevated PSA, HLD (hyperlipidemia), MRSA infection (2009), Kidney stone, Nocturia, Over weight, Rotator cuff tear, and Seizures ([place]). here with :  1.  Seizures  Overall the patient is doing well he will continue on Vimpat 150 mg twice a day.  Advised that if he has any seizure events he should let us know.  He will follow-up in 1 year or sooner if needed.  I spent 15 minutes with the patient. 50% of this time was spent reviewing plan of care   Ward Givens, MSN, NP-C 12/18/2018, 8:09 AM [facility] [address], [place],  [address] [phone]  Made any corrections needed, and agree with history, physical, neuro exam,assessment and plan as stated.     Sarina Ill, MD  [facility]

## 2018-12-18 NOTE — Patient Instructions (Signed)
Continue Vimpat If you have any seizure events please let us know.  

## 2018-12-23 ENCOUNTER — Other Ambulatory Visit: Payer: Self-pay | Admitting: Family Medicine

## 2018-12-24 NOTE — Telephone Encounter (Signed)
Last office visit 10/24/2018 for Right knee pain.  Last refilled 09/258/2020 for #30 with 1 refill.  CPE scheduled for 08/31/2019.

## 2018-12-28 NOTE — Telephone Encounter (Signed)
Sent. Thanks.   

## 2019-01-01 ENCOUNTER — Other Ambulatory Visit: Payer: Self-pay | Admitting: Orthopedic Surgery

## 2019-01-08 ENCOUNTER — Encounter: Payer: Self-pay | Admitting: Orthopedic Surgery

## 2019-01-08 ENCOUNTER — Encounter
Admission: RE | Admit: 2019-01-08 | Discharge: 2019-01-08 | Disposition: A | Discharge status: Home / Self Care | Payer: 59 | Source: Ambulatory Visit | Attending: Orthopedic Surgery | Admitting: Orthopedic Surgery

## 2019-01-08 DIAGNOSIS — F419 Anxiety disorder, unspecified: Secondary | ICD-10-CM | POA: Diagnosis not present

## 2019-01-08 DIAGNOSIS — E663 Overweight: Secondary | ICD-10-CM | POA: Diagnosis not present

## 2019-01-08 DIAGNOSIS — M659 Synovitis and tenosynovitis, unspecified: Secondary | ICD-10-CM | POA: Diagnosis not present

## 2019-01-08 DIAGNOSIS — S83231A Complex tear of medial meniscus, current injury, right knee, initial encounter: Secondary | ICD-10-CM | POA: Diagnosis not present

## 2019-01-08 DIAGNOSIS — M1711 Unilateral primary osteoarthritis, right knee: Secondary | ICD-10-CM | POA: Diagnosis not present

## 2019-01-08 DIAGNOSIS — M94261 Chondromalacia, right knee: Secondary | ICD-10-CM | POA: Diagnosis not present

## 2019-01-08 DIAGNOSIS — S83241A Other tear of medial meniscus, current injury, right knee, initial encounter: Secondary | ICD-10-CM | POA: Diagnosis present

## 2019-01-08 DIAGNOSIS — M19041 Primary osteoarthritis, right hand: Secondary | ICD-10-CM | POA: Diagnosis not present

## 2019-01-08 DIAGNOSIS — Z6831 Body mass index (BMI) 31.0-31.9, adult: Secondary | ICD-10-CM | POA: Diagnosis not present

## 2019-01-08 DIAGNOSIS — R569 Unspecified convulsions: Secondary | ICD-10-CM | POA: Diagnosis not present

## 2019-01-08 DIAGNOSIS — Z791 Long term (current) use of non-steroidal anti-inflammatories (NSAID): Secondary | ICD-10-CM | POA: Diagnosis not present

## 2019-01-08 DIAGNOSIS — Z79899 Other long term (current) drug therapy: Secondary | ICD-10-CM | POA: Diagnosis not present

## 2019-01-08 DIAGNOSIS — Z880 Allergy status to penicillin: Secondary | ICD-10-CM | POA: Diagnosis not present

## 2019-01-08 DIAGNOSIS — M19042 Primary osteoarthritis, left hand: Secondary | ICD-10-CM | POA: Diagnosis not present

## 2019-01-08 NOTE — Patient Instructions (Signed)
Your procedure is scheduled IO:[facility] 12/15 Report to Day Surgery. To find out your arrival time please call [phone] between 1PM - 3PM on Mon 12/14.  Remember: Instructions that are not followed completely may result in serious medical risk,  up to and including death, or upon the discretion of your surgeon and anesthesiologist your  surgery may need to be rescheduled.     _X__ 1. Do not eat food after midnight the night before your procedure.                 No gum chewing or hard candies. You may drink clear liquids up to 2 hours                 before you are scheduled to arrive for your surgery- DO not drink clear                 liquids within 2 hours of the start of your surgery.                 Clear Liquids include:  water, apple juice without pulp, clear carbohydrate                 drink such as Clearfast of Gatorade, Black Coffee or Tea (Do not add                 anything to coffee or tea).  __X__2.  On the morning of surgery brush your teeth with toothpaste and water, you                may rinse your mouth with mouthwash if you wish.  Do not swallow any toothpaste of mouthwash.     _X__ 3.  No Alcohol for 24 hours before or after surgery.   ___ 4.  Do Not Smoke or use e-cigarettes For 24 Hours Prior to Your Surgery.                 Do not use any chewable tobacco products for at least 6 hours prior to                 surgery.  ____  5.  Bring all medications with you on the day of surgery if instructed.   _x___  6.  Notify your doctor if there is any change in your medical condition      (cold, fever, infections).     Do not wear jewelry, make-up, hairpins, clips or nail polish. Do not wear lotions, powders, or perfumes. You may wear deodorant. Do not shave 48 hours prior to surgery. Men may shave face and neck. Do not bring valuables to the hospital.    [facility] is not responsible for any belongings or valuables.  Contacts, dentures or bridgework may not  be worn into surgery. Leave your suitcase in the car. After surgery it may be brought to your room. For patients admitted to the hospital, discharge time is determined by your treatment team.   Patients discharged the day of surgery will not be allowed to drive home.   Please read over the following fact sheets that you were given:    ___x_ Take these medicines the morning of surgery with A SIP OF WATER:    1. Lacosamide (VIMPAT) 150 MG TABS  2. escitalopram (LEXAPRO) 10 MG tablet  3.   4.  5.  6.  ____ Fleet Enema (as directed)   __x__ Use CHG Soap as directed  ____ Use inhalers on the day of surgery  ____ Stop metformin 2 days prior to surgery    ____ Take 1/2 of usual insulin dose the night before surgery. No insulin the morning          of surgery.   ____ Stop Coumadin/Plavix/aspirin on   _x___ Stop Anti-inflammatories  meloxicam (MOBIC) 15 MG tablet, ibuprofen, aleve    May tylenol   ____ Stop supplements until after surgery.    ____ Bring C-Pap to the hospital.

## 2019-01-08 NOTE — Patient Instructions (Signed)
Your procedure is scheduled on:Tues 12/15 Report to Day Surgery. To find out your arrival time please call [phone] between 1PM - 3PM on Mon 12/14.  Remember: Instructions that are not followed completely may result in serious medical risk,  up to and including death, or upon the discretion of your surgeon and anesthesiologist your  surgery may need to be rescheduled.     _X__ 1. Do not eat food after midnight the night before your procedure.                 No gum chewing or hard candies. You may drink clear liquids up to 2 hours                 before you are scheduled to arrive for your surgery- DO not drink clear                 liquids within 2 hours of the start of your surgery.                 Clear Liquids include:  water, apple juice without pulp, clear carbohydrate                 drink such as Clearfast of Gatorade, Black Coffee or Tea (Do not add                 anything to coffee or tea).  __X__2.  On the morning of surgery brush your teeth with toothpaste and water, you                may rinse your mouth with mouthwash if you wish.  Do not swallow any toothpaste of mouthwash.     _X__ 3.  No Alcohol for 24 hours before or after surgery.   ___ 4.  Do Not Smoke or use e-cigarettes For 24 Hours Prior to Your Surgery.                 Do not use any chewable tobacco products for at least 6 hours prior to                 surgery.  ____  5.  Bring all medications with you on the day of surgery if instructed.   _x___  6.  Notify your doctor if there is any change in your medical condition      (cold, fever, infections).     Do not wear jewelry, make-up, hairpins, clips or nail polish. Do not wear lotions, powders, or perfumes. You may wear deodorant. Do not shave 48 hours prior to surgery. Men may shave face and neck. Do not bring valuables to the hospital.    [facility] is not responsible for any belongings or valuables.  Contacts, dentures or bridgework may not  be worn into surgery. Leave your suitcase in the car. After surgery it may be brought to your room. For patients admitted to the hospital, discharge time is determined by your treatment team.   Patients discharged the day of surgery will not be allowed to drive home.   Please read over the following fact sheets that you were given:    ___x_ Take these medicines the morning of surgery with A SIP OF WATER:    1. Lacosamide (VIMPAT) 150 MG TABS  2. escitalopram (LEXAPRO) 10 MG tablet  3.   4.  5.  6.  ____ Fleet Enema (as directed)   __x__ Use CHG Soap as directed    ____ Use inhalers on the day of surgery  ____ Stop metformin 2 days prior to surgery    ____ Take 1/2 of usual insulin dose the night before surgery. No insulin the morning          of surgery.   ____ Stop Coumadin/Plavix/aspirin on   _x___ Stop Anti-inflammatories  meloxicam (MOBIC) 15 MG tablet, ibuprofen, aleve    May tylenol   ____ Stop supplements until after surgery.    ____ Bring C-Pap to the hospital.

## 2019-01-09 ENCOUNTER — Other Ambulatory Visit
Admission: RE | Admit: 2019-01-09 | Discharge: 2019-01-09 | Disposition: A | Discharge status: Home / Self Care | Payer: 59 | Source: Ambulatory Visit | Attending: Orthopedic Surgery | Admitting: Orthopedic Surgery

## 2019-01-09 DIAGNOSIS — Z20828 Contact with and (suspected) exposure to other viral communicable diseases: Secondary | ICD-10-CM | POA: Diagnosis not present

## 2019-01-09 DIAGNOSIS — Z01812 Encounter for preprocedural laboratory examination: Secondary | ICD-10-CM | POA: Insufficient documentation

## 2019-01-09 LAB — CBC WITH DIFFERENTIAL/PLATELET
Abs Immature Granulocytes: 0.02 10*3/uL (ref 0.00–0.07)
Basophils Absolute: 0 10*3/uL (ref 0.0–0.1)
Basophils Relative: 1 %
Eosinophils Absolute: 0.2 10*3/uL (ref 0.0–0.5)
Eosinophils Relative: 3 %
HCT: 40 % (ref 39.0–52.0)
Hemoglobin: 13.4 g/dL (ref 13.0–17.0)
Immature Granulocytes: 0 %
Lymphocytes Relative: 23 %
Lymphs Abs: 1.6 10*3/uL (ref 0.7–4.0)
MCH: 29.4 pg (ref 26.0–34.0)
MCHC: 33.5 g/dL (ref 30.0–36.0)
MCV: 87.7 fL (ref 80.0–100.0)
Monocytes Absolute: 0.6 10*3/uL (ref 0.1–1.0)
Monocytes Relative: 9 %
Neutro Abs: 4.6 10*3/uL (ref 1.7–7.7)
Neutrophils Relative %: 64 %
Platelets: 206 10*3/uL (ref 150–400)
RBC: 4.56 MIL/uL (ref 4.22–5.81)
RDW: 12.5 % (ref 11.5–15.5)
WBC: 7.1 10*3/uL (ref 4.0–10.5)
nRBC: 0 % (ref 0.0–0.2)

## 2019-01-09 LAB — BASIC METABOLIC PANEL
Anion gap: 10 (ref 5–15)
BUN: 24 mg/dL — ABNORMAL HIGH (ref 6–20)
CO2: 23 mmol/L (ref 22–32)
Calcium: 9 mg/dL (ref 8.9–10.3)
Chloride: 106 mmol/L (ref 98–111)
Creatinine, Ser: 0.75 mg/dL (ref 0.61–1.24)
GFR calc Af Amer: >60 mL/min (ref >60)
GFR calc non Af Amer: >60 mL/min (ref >60)
Glucose, Bld: 89 mg/dL (ref 70–99)
Potassium: 4 mmol/L (ref 3.5–5.1)
Sodium: 139 mmol/L (ref 135–145)

## 2019-01-09 LAB — PROTIME-INR
INR: 0.9 (ref 0.8–1.2)
Prothrombin Time: 12.3 seconds (ref 11.4–15.2)

## 2019-01-09 LAB — APTT: aPTT: 31 seconds (ref 24–36)

## 2019-01-09 LAB — SARS CORONAVIRUS 2 (TAT 6-24 HRS): SARS Coronavirus 2: NEGATIVE

## 2019-01-13 ENCOUNTER — Ambulatory Visit
Admission: RE | Admit: 2019-01-13 | Discharge: 2019-01-13 | Disposition: A | Discharge status: Home / Self Care | Payer: 59 | Attending: Orthopedic Surgery | Admitting: Orthopedic Surgery

## 2019-01-13 ENCOUNTER — Ambulatory Visit: Payer: 59 | Admitting: Certified Registered"

## 2019-01-13 ENCOUNTER — Encounter
Admission: RE | Disposition: A | Discharge status: Home / Self Care | Payer: Self-pay | Source: Home / Self Care | Attending: Orthopedic Surgery

## 2019-01-13 ENCOUNTER — Encounter: Payer: Self-pay | Admitting: Orthopedic Surgery

## 2019-01-13 DIAGNOSIS — M1711 Unilateral primary osteoarthritis, right knee: Secondary | ICD-10-CM | POA: Insufficient documentation

## 2019-01-13 DIAGNOSIS — M94261 Chondromalacia, right knee: Secondary | ICD-10-CM | POA: Insufficient documentation

## 2019-01-13 DIAGNOSIS — Z880 Allergy status to penicillin: Secondary | ICD-10-CM | POA: Insufficient documentation

## 2019-01-13 DIAGNOSIS — Z6831 Body mass index (BMI) 31.0-31.9, adult: Secondary | ICD-10-CM | POA: Insufficient documentation

## 2019-01-13 DIAGNOSIS — R569 Unspecified convulsions: Secondary | ICD-10-CM | POA: Insufficient documentation

## 2019-01-13 DIAGNOSIS — S83231A Complex tear of medial meniscus, current injury, right knee, initial encounter: Secondary | ICD-10-CM | POA: Diagnosis not present

## 2019-01-13 DIAGNOSIS — M19042 Primary osteoarthritis, left hand: Secondary | ICD-10-CM | POA: Insufficient documentation

## 2019-01-13 DIAGNOSIS — Z791 Long term (current) use of non-steroidal anti-inflammatories (NSAID): Secondary | ICD-10-CM | POA: Insufficient documentation

## 2019-01-13 DIAGNOSIS — M19041 Primary osteoarthritis, right hand: Secondary | ICD-10-CM | POA: Insufficient documentation

## 2019-01-13 DIAGNOSIS — M659 Synovitis and tenosynovitis, unspecified: Secondary | ICD-10-CM | POA: Insufficient documentation

## 2019-01-13 DIAGNOSIS — Z79899 Other long term (current) drug therapy: Secondary | ICD-10-CM | POA: Insufficient documentation

## 2019-01-13 DIAGNOSIS — E663 Overweight: Secondary | ICD-10-CM | POA: Insufficient documentation

## 2019-01-13 DIAGNOSIS — F419 Anxiety disorder, unspecified: Secondary | ICD-10-CM | POA: Insufficient documentation

## 2019-01-13 HISTORY — PX: KNEE ARTHROSCOPY WITH MEDIAL MENISECTOMY: SHX5651

## 2019-01-13 HISTORY — DX: Personal history of urinary calculi: Z87.442

## 2019-01-13 SURGERY — ARTHROSCOPY, KNEE, WITH MEDIAL MENISCECTOMY
Anesthesia: General | Site: Knee | Laterality: Right

## 2019-01-13 MED ORDER — MIDAZOLAM HCL 2 MG/2ML IJ SOLN
INTRAMUSCULAR | Status: AC
  Filled 2019-01-13: qty 2

## 2019-01-13 MED ORDER — FAMOTIDINE 20 MG PO TABS
ORAL_TABLET | ORAL | Status: AC
  Administered 2019-01-13: 20 mg via ORAL
  Filled 2019-01-13: qty 1

## 2019-01-13 MED ORDER — EPHEDRINE SULFATE 50 MG/ML IJ SOLN
INTRAMUSCULAR | Status: AC
  Filled 2019-01-13: qty 1

## 2019-01-13 MED ORDER — HYDROCODONE-ACETAMINOPHEN 5-325 MG PO TABS: 1.0000 | ORAL_TABLET | ORAL | 0 refills | Status: DC | PRN

## 2019-01-13 MED ORDER — FAMOTIDINE 20 MG PO TABS: 20.0000 mg | ORAL_TABLET | Freq: Once | ORAL | Status: AC

## 2019-01-13 MED ORDER — ONDANSETRON HCL 4 MG/2ML IJ SOLN: 4.0000 mg | Freq: Once | INTRAMUSCULAR | Status: DC | PRN

## 2019-01-13 MED ORDER — FENTANYL CITRATE (PF) 100 MCG/2ML IJ SOLN
INTRAMUSCULAR | Status: AC
  Filled 2019-01-13: qty 2

## 2019-01-13 MED ORDER — ASPIRIN EC 325 MG PO TBEC: 325.0000 mg | DELAYED_RELEASE_TABLET | Freq: Every day | ORAL | 0 refills | Status: DC

## 2019-01-13 MED ORDER — LIDOCAINE HCL (CARDIAC) PF 100 MG/5ML IV SOSY
PREFILLED_SYRINGE | INTRAVENOUS | Status: DC | PRN
  Administered 2019-01-13: 100 mg via INTRAVENOUS

## 2019-01-13 MED ORDER — VANCOMYCIN HCL IN DEXTROSE 1-5 GM/200ML-% IV SOLN
1000.0000 mg | INTRAVENOUS | Status: AC
  Administered 2019-01-13: 1000 mg via INTRAVENOUS

## 2019-01-13 MED ORDER — MIDAZOLAM HCL 2 MG/2ML IJ SOLN
INTRAMUSCULAR | Status: DC | PRN
  Administered 2019-01-13: 2 mg via INTRAVENOUS

## 2019-01-13 MED ORDER — LIDOCAINE HCL (PF) 1 % IJ SOLN
INTRAMUSCULAR | Status: AC
  Filled 2019-01-13: qty 30

## 2019-01-13 MED ORDER — DEXAMETHASONE SODIUM PHOSPHATE 10 MG/ML IJ SOLN
INTRAMUSCULAR | Status: AC
  Filled 2019-01-13: qty 1

## 2019-01-13 MED ORDER — ONDANSETRON HCL 4 MG/2ML IJ SOLN
INTRAMUSCULAR | Status: DC | PRN
  Administered 2019-01-13: 4 mg via INTRAVENOUS

## 2019-01-13 MED ORDER — CHLORHEXIDINE GLUCONATE CLOTH 2 % EX PADS
6.0000 | MEDICATED_PAD | Freq: Once | CUTANEOUS | Status: AC
  Administered 2019-01-13: 6 via TOPICAL

## 2019-01-13 MED ORDER — DEXAMETHASONE SODIUM PHOSPHATE 10 MG/ML IJ SOLN
INTRAMUSCULAR | Status: DC | PRN
  Administered 2019-01-13: 10 mg via INTRAVENOUS

## 2019-01-13 MED ORDER — HYDROCODONE-ACETAMINOPHEN 5-325 MG PO TABS: 1.0000 | ORAL_TABLET | ORAL | Status: AC | PRN

## 2019-01-13 MED ORDER — CHLORHEXIDINE GLUCONATE CLOTH 2 % EX PADS: 6.0000 | MEDICATED_PAD | Freq: Once | CUTANEOUS | Status: DC

## 2019-01-13 MED ORDER — ONDANSETRON HCL 4 MG/2ML IJ SOLN
INTRAMUSCULAR | Status: AC
  Filled 2019-01-13: qty 2

## 2019-01-13 MED ORDER — BUPIVACAINE-EPINEPHRINE (PF) 0.25% -1:200000 IJ SOLN
INTRAMUSCULAR | Status: AC
  Filled 2019-01-13: qty 30

## 2019-01-13 MED ORDER — PROPOFOL 10 MG/ML IV BOLUS
INTRAVENOUS | Status: DC | PRN
  Administered 2019-01-13: 160 mg via INTRAVENOUS

## 2019-01-13 MED ORDER — FENTANYL CITRATE (PF) 100 MCG/2ML IJ SOLN
INTRAMUSCULAR | Status: DC | PRN
  Administered 2019-01-13 (×2): 25 ug via INTRAVENOUS
  Administered 2019-01-13 (×2): 50 ug via INTRAVENOUS

## 2019-01-13 MED ORDER — EPHEDRINE SULFATE 50 MG/ML IJ SOLN
INTRAMUSCULAR | Status: DC | PRN
  Administered 2019-01-13 (×5): 10 mg via INTRAVENOUS

## 2019-01-13 MED ORDER — HYDROCODONE-ACETAMINOPHEN 5-325 MG PO TABS
ORAL_TABLET | ORAL | Status: AC
  Administered 2019-01-13: 1 via ORAL
  Filled 2019-01-13: qty 1

## 2019-01-13 MED ORDER — VANCOMYCIN HCL IN DEXTROSE 1-5 GM/200ML-% IV SOLN
INTRAVENOUS | Status: AC
  Administered 2019-01-13: 1000 mg via INTRAVENOUS
  Filled 2019-01-13: qty 200

## 2019-01-13 MED ORDER — ONDANSETRON HCL 4 MG PO TABS: 4.0000 mg | ORAL_TABLET | Freq: Three times a day (TID) | ORAL | 0 refills | Status: DC | PRN

## 2019-01-13 MED ORDER — BUPIVACAINE-EPINEPHRINE (PF) 0.25% -1:200000 IJ SOLN
INTRAMUSCULAR | Status: DC | PRN
  Administered 2019-01-13: 20 mL via PERINEURAL

## 2019-01-13 MED ORDER — LIDOCAINE HCL (PF) 1 % IJ SOLN
INTRAMUSCULAR | Status: DC | PRN
  Administered 2019-01-13: 10 mL

## 2019-01-13 MED ORDER — FENTANYL CITRATE (PF) 100 MCG/2ML IJ SOLN
25.0000 ug | INTRAMUSCULAR | Status: AC | PRN
  Administered 2019-01-13 (×6): 25 ug via INTRAVENOUS

## 2019-01-13 MED ORDER — FENTANYL CITRATE (PF) 250 MCG/5ML IJ SOLN
INTRAMUSCULAR | Status: AC
  Filled 2019-01-13: qty 5

## 2019-01-13 MED ORDER — LACTATED RINGERS IV SOLN: INTRAVENOUS | Status: DC

## 2019-01-13 SURGICAL SUPPLY — 36 items
BUR RADIUS 3.5 (BURR) ×2 IMPLANT
BUR RADIUS 4.0X18.5 (BURR) ×2 IMPLANT
COVER WAND RF STERILE (DRAPES) ×2 IMPLANT
CUFF TOURN 24 STER (MISCELLANEOUS) IMPLANT
CUFF TOURN 30 STER DUAL PORT (MISCELLANEOUS) ×2 IMPLANT
DRAPE IMP U-DRAPE 54X76 (DRAPES) ×2 IMPLANT
DURAPREP 26ML APPLICATOR (WOUND CARE) ×4 IMPLANT
GAUZE SPONGE 4X4 12PLY STRL (GAUZE/BANDAGES/DRESSINGS) ×2 IMPLANT
GAUZE XEROFORM 1X8 LF (GAUZE/BANDAGES/DRESSINGS) ×2 IMPLANT
GLOVE BIOGEL PI IND STRL 9 (GLOVE) ×1 IMPLANT
GLOVE BIOGEL PI INDICATOR 9 (GLOVE) ×1
GLOVE SURG 9.0 ORTHO LTXF (GLOVE) ×4 IMPLANT
GOWN STRL REUS W/ TWL LRG LVL3 (GOWN DISPOSABLE) ×1 IMPLANT
GOWN STRL REUS W/TWL 2XL LVL3 (GOWN DISPOSABLE) ×2 IMPLANT
GOWN STRL REUS W/TWL LRG LVL3 (GOWN DISPOSABLE) ×1
IV LACTATED RINGER IRRG 3000ML (IV SOLUTION) ×9
IV LR IRRIG 3000ML ARTHROMATIC (IV SOLUTION) ×9 IMPLANT
KIT TURNOVER KIT A (KITS) ×2 IMPLANT
MANIFOLD NEPTUNE II (INSTRUMENTS) ×2 IMPLANT
MAT ABSORB  FLUID 56X50 GRAY (MISCELLANEOUS) ×1
MAT ABSORB FLUID 56X50 GRAY (MISCELLANEOUS) ×1 IMPLANT
NEEDLE HYPO 22GX1.5 SAFETY (NEEDLE) ×2 IMPLANT
PACK ARTHROSCOPY KNEE (MISCELLANEOUS) ×2 IMPLANT
PAD ABD DERMACEA PRESS 5X9 (GAUZE/BANDAGES/DRESSINGS) ×4 IMPLANT
SET TUBE SUCT SHAVER OUTFL 24K (TUBING) ×2 IMPLANT
SET TUBE TIP INTRA-ARTICULAR (MISCELLANEOUS) ×2 IMPLANT
SOL PREP PVP 2OZ (MISCELLANEOUS)
SOLUTION PREP PVP 2OZ (MISCELLANEOUS) IMPLANT
STOCKINETTE BIAS CUT 6 980064 (GAUZE/BANDAGES/DRESSINGS) ×2 IMPLANT
STRIP CLOSURE SKIN 1/2X4 (GAUZE/BANDAGES/DRESSINGS) ×2 IMPLANT
SUT ETHILON 4-0 (SUTURE) ×1
SUT ETHILON 4-0 FS2 18XMFL BLK (SUTURE) ×1
SUTURE ETHLN 4-0 FS2 18XMF BLK (SUTURE) ×1 IMPLANT
TUBING ARTHRO INFLOW-ONLY STRL (TUBING) ×2 IMPLANT
WAND HAND CNTRL MULTIVAC 50 (MISCELLANEOUS) ×2 IMPLANT
WAND HAND CNTRL MULTIVAC 90 (MISCELLANEOUS) ×2 IMPLANT

## 2019-01-13 NOTE — Transfer of Care (Signed)
Immediate Anesthesia Transfer of Care Note  Patient: Mark Hernandez.  Procedure(s) Performed: KNEE ARTHROSCOPY WITH MEDIAL MENISECTOMY (Right Knee)  Patient Location: PACU  Anesthesia Type:General  Level of Consciousness: drowsy and patient cooperative  Airway & Oxygen Therapy: Patient Spontanous Breathing and Patient connected to face mask oxygen  Post-op Assessment: Report given to RN and Post -op Vital signs reviewed and stable  Post vital signs: Reviewed and stable  Last Vitals:  Vitals Value Taken Time  BP 145/93 01/13/19 1140  Temp    Pulse 93 01/13/19 1140  Resp 12 01/13/19 1140  SpO2 99 % 01/13/19 1140  Vitals shown include unvalidated device data.  Last Pain:  Vitals:   01/13/19 0828  TempSrc: Tympanic  PainSc: 0-No pain         Complications: No apparent anesthesia complications

## 2019-01-13 NOTE — Op Note (Addendum)
PATIENT:  Mark Hernandez.  PRE-OPERATIVE DIAGNOSIS:  RIGHT KNEE TEAR OF MEDIAL MENISCUS AND OSTEOARTHRITIS  POST-OPERATIVE DIAGNOSIS:  Same  PROCEDURE:  RIGHT KNEE ARTHROSCOPY WITH  Partial MEDIAL MENISECTOMY, extensive synovectomy, excision of medial and superior plica, chondroplasty of the medial and patellofemoral compartments  SURGEON:  Thornton Park, MD  ANESTHESIA:   General  PREOPERATIVE INDICATIONS:  Mark Hernandez.  58 y.o. male with a diagnosis of [place] who failed conservative management and elected for surgical management.    The risks benefits and alternatives were discussed with the patient preoperatively including the risks of infection, bleeding, nerve injury, knee stiffness, persistent pain, osteoarthritis and the need for further surgery. Medical  risks include DVT and pulmonary embolism, myocardial infarction, stroke, pneumonia, respiratory failure and death. The patient understood these risks and wished to proceed.  OPERATIVE FINDINGS:  1. complex tear of the body and posterior horn of the medial meniscus 2.  Extensive synovitis 3.  Diffuse chondromalacia in the medial femoral condyle and patellofemoral joint with softening of the weightbearing surface of the lateral femoral condyle  OPERATIVE PROCEDURE: Patient was met in the preoperative area. The operative extremity was signed with the word yes and my initials according the hospital's correct site of surgery protocol.  The patient was brought to the operating room where they was placed supine on the operative table. General anesthesia was administered. The patient was prepped and draped in a sterile fashion.  A timeout was performed to verify the patient's name, date of birth, medical record number, correct site of surgery correct procedure to be performed. It was also used to verify the patient received antibiotics that all appropriate instruments, and radiographic studies were available  in the room. Once all in attendance were in agreement, the case began.  Proposed arthroscopy incisions were drawn out with a surgical marker. These were pre-injected with 1% lidocaine plain. An 11 blade was used to establish an inferior lateral and inferomedial portals. The inferomedial portal was created using a 18-gauge spinal needle under direct visualization.  A full diagnostic examination of the knee was performed including the suprapatellar pouch, patellofemoral joint, medial lateral compartments as well as the medial lateral gutters, the intercondylar notch in the posterior knee.  The patient had the complex medial meniscal tear treated with a 3.39m resector shaver blade and straight duckbill biter.  The tear was deemed unstable with probing.  The medial meniscus tear was debrided until a stable rim was achieved as confirmed with a hook probe. A chondroplasty of the medial femoral condyle, trochlea and undersurface of patella was also performed using a 4.0 resector shaver blade. An synovectomy including debridement of a medial and superior plica was also performed using a 4.0 resector shaver blade and 90 ArthroCare wand.    The knee was then copiously lavaged. All arthroscopic instruments were removed. The two arthroscopy portals were closed with 4-0 nylon.  An intra-articular injection of 0.25% percent Marcaine plain was injected intra-articularly at the conclusion the case to help with postoperative pain.  Steri-Strips were applied along with a dry sterile and compressive dressing. The patient was brought to the PACU in stable condition. I was scrubbed and present for the entire case and all sharp and instrument counts were correct at the conclusion the case. I spoke with the patient's wife by phone from the PACU postoperatively to let her know the case had been performed without complication and the patient was stable in the recovery  room.   Timoteo Gaul, MD

## 2019-01-13 NOTE — Discharge Instructions (Addendum)
AMBULATORY SURGERY  DISCHARGE INSTRUCTIONS   1) The drugs that you were given will stay in your system until tomorrow so for the next 24 hours you should not:  A) Drive an automobile B) Make any legal decisions C) Drink any alcoholic beverage   2) You may resume regular meals tomorrow.  Today it is better to start with liquids and gradually work up to solid foods.  You may eat anything you prefer, but it is better to start with liquids, then soup and crackers, and gradually work up to solid foods.   3) Please notify your doctor immediately if you have any unusual bleeding, trouble breathing, redness and pain at the surgery site, drainage, fever, or pain not relieved by medication.    4) Additional Instructions:        Please contact your physician with any problems or Same Day Surgery at [phone], Monday through Friday 6 am to 4 pm, or [place] at [facility] number at [phone].  Hydrocodone 5-325mg  tablet taken at 1:10pm 01/13/2019.

## 2019-01-13 NOTE — Anesthesia Post-op Follow-up Note (Signed)
Anesthesia QCDR form completed.        

## 2019-01-13 NOTE — H&P (Signed)
PREOPERATIVE H&P  Chief Complaint: [phone] other tear of medial meniscus current injury right knee  HPI: Mark Hernandez. is a 58 y.o. male who presents for preoperative history and physical with a diagnosis of S83.241A other tear of medial meniscus current injury right knee. Symptoms of medial sided right knee pain, limitation of motion and mechanical symptoms are significantly impairing activities of daily living including the ability to ambulate comfortably.  He has failed nonoperative management including corticosteroid injection, bracing and physical therapy exercises.  MRI has confirmed a medial meniscus tear along with medial and patellofemoral joint arthrosis.  Patient wished to proceed with arthroscopic partial medial meniscectomy.   Past Medical History:  Diagnosis Date  . Anxiety   . Arthritis    BOTH HANDS  . BPH (benign prostatic hypertrophy)   . Elevated PSA   . History of kidney stones   . HLD (hyperlipidemia)   . Hx MRSA infection 2009  . Nocturia   . Over weight   . Rotator cuff tear   . Seizures (HCC)    Past Surgical History:  Procedure Laterality Date  . COLONOSCOPY  2014?  . INGUINAL HERNIA REPAIR Left 02/17/2015   Procedure: LAPAROSCOPIC INGUINAL HERNIA;  Surgeon: Kieth Brightly, MD;  Location: [facility];  Service: General;  Laterality: Left;  . LITHOTRIPSY     x 2  . SHOULDER ARTHROSCOPY WITH OPEN ROTATOR CUFF REPAIR Left 09/23/2014   Procedure: SHOULDER ARTHROSCOPY , debridement, decompression, WITH  mini OPEN ROTATOR CUFF REPAIR;  Surgeon: Christena Flake, MD;  Location: [facility];  Service: Orthopedics;  Laterality: Left;  . SHOULDER SURGERY Bilateral    rotator cuff repair  . UMBILICAL HERNIA REPAIR N/A 02/17/2015   Procedure: LAPAROSCOPIC UMBILICAL HERNIA;  Surgeon: Kieth Brightly, MD;  Location: [facility];  Service: General;  Laterality: N/A;   Social History   Socioeconomic History  . Marital status: Married    Spouse name: Rosalita Chessman  . Number  of children: 2  . Years of education: 27  . Highest education level: Not on file  Occupational History  . Occupation: Engineer, structural   Tobacco Use  . Smoking status: Never Smoker  . Smokeless tobacco: Former Neurosurgeon    Types: Chew  Substance and Sexual Activity  . Alcohol use: Yes    Comment: occ  . Drug use: No  . Sexual activity: Not on file  Other Topics Concern  . 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 Difficulty of Paying Living Expenses: Not on file  Food Insecurity:   . Worried About Programme researcher, broadcasting/film/video in the Last Year: Not on file  . Ran Out of Food in the Last Year: Not on file  Transportation Needs:   . Lack of Transportation (Medical): Not on file  . Lack of Transportation (Non-Medical): Not on file  Physical Activity:   . Days of Exercise per Week: Not on file  . Minutes of Exercise per Session: Not on file  Stress:   . Feeling of Stress : Not on file  Social Connections:   . Frequency of Communication with Friends and Family: Not on file  . Frequency of Social Gatherings with Friends and Family: Not on file  . Attends Religious  Services: Not on file  . Active Member of Clubs or Organizations: Not on file  . Attends Archivist Meetings: Not on file  . Marital Status: Not on file   Family History  Problem Relation Age of Onset  . COPD Mother   . Prostate cancer Father   . Colon cancer Paternal Grandmother   . Kidney disease Paternal Uncle   . Kidney cancer Paternal Uncle    Allergies  Allergen Reactions  . Penicillins Rash    Did it involve swelling of the face/tongue/throat, SOB, or low BP? Unknown Did it involve sudden or severe rash/hives, skin peeling, or any reaction on the inside of your mouth or nose? Unknown Did you need to seek  medical attention at a hospital or doctor's office? Unknown When did it last happen?Childhood If all above answers are "NO", may proceed with cephalosporin use.    Prior to Admission medications   Medication Sig Start Date End Date Taking? Authorizing Provider  acetaminophen (TYLENOL) 500 MG tablet Take 1,000 mg by mouth every 6 (six) hours as needed for moderate pain or headache.    Yes [provider]  escitalopram (LEXAPRO) 10 MG tablet Take 1 tablet (10 mg total) by mouth daily. 08/25/18  Yes Tonia Ghent, MD  Lacosamide (VIMPAT) 150 MG TABS Take 1 tablet (150 mg total) by mouth 2 (two) times daily. 09/24/18  Yes Melvenia Beam, MD  meloxicam (MOBIC) 15 MG tablet TAKE 1 TABLET BY MOUTH EVERY DAY WITH FOOD Patient taking differently: Take 15 mg by mouth daily.  12/28/18  Yes Tonia Ghent, MD     Positive ROS: All other systems have been reviewed and were otherwise negative with the exception of those mentioned in the HPI and as above.  Physical Exam: General: Alert, no acute distress Cardiovascular: Regular rate and rhythm, no murmurs rubs or gallops.  No pedal edema Respiratory: Clear to auscultation bilaterally, no wheezes rales or rhonchi. No cyanosis, no use of accessory musculature GI: No organomegaly, abdomen is soft and non-tender nondistended with positive bowel sounds. Skin: Skin intact, no lesions within the operative field. Neurologic: Sensation intact distally Psychiatric: Patient is competent for consent with normal mood and affect Lymphatic: No cervical lymphadenopathy  MUSCULOSKELETAL: Right knee: Patient has tenderness over the medial joint line.  There is no erythema ecchymosis or effusion.  His range of motion is from 0 to 120 degrees.  He has pain with McMurray's testing but no ligamentous laxity.  5 out of 5 strength in all muscles, intact sensation light touch and palpable pedal pulses.  Assessment: Right knee medial meniscus tear with  medial and patellofemoral compartment arthrosis  Plan: Plan for Procedure(s): RIGHT KNEE ARTHROSCOPY WITH PARTIAL MEDIAL MENISECTOMY  I reviewed the details of the operation as well as the postoperative course with the patient.  I discussed the risks and benefits of surgery. The risks include but are not limited to infection, bleeding, nerve or blood vessel injury, joint stiffness or loss of motion, persistent pain, weakness or instability, retear of the meniscus and the need for further surgery. Medical risks include but are not limited to DVT and pulmonary embolism, myocardial infarction, stroke, pneumonia, respiratory failure and death. Patient understood these risks and wished to proceed.     Thornton Park, MD   01/13/2019 9:35 AM

## 2019-01-13 NOTE — Anesthesia Preprocedure Evaluation (Signed)
Anesthesia Evaluation  Patient identified by MRN, date of birth, ID band Patient awake    Reviewed: Allergy & Precautions, H&P , NPO status , Patient's Chart, lab work & pertinent test results, reviewed documented beta blocker date and time   Airway Mallampati: II  TM Distance: >3 FB Neck ROM: full    Dental  (+) Teeth Intact   Pulmonary neg pulmonary ROS,    Pulmonary exam normal        Cardiovascular Exercise Tolerance: Good negative cardio ROS Normal cardiovascular exam Rate:Normal     Neuro/Psych Seizures -, Well Controlled,  Anxiety negative psych ROS   GI/Hepatic negative GI ROS, Neg liver ROS,   Endo/Other  negative endocrine ROS  Renal/GU negative Renal ROS  negative genitourinary   Musculoskeletal   Abdominal   Peds  Hematology negative hematology ROS (+)   Anesthesia Other Findings   Reproductive/Obstetrics negative OB ROS                             Anesthesia Physical Anesthesia Plan  ASA: II  Anesthesia Plan: General LMA   Post-op Pain Management:    Induction:   PONV Risk Score and Plan:   Airway Management Planned:   Additional Equipment:   Intra-op Plan:   Post-operative Plan:   Informed Consent: I have reviewed the patients History and Physical, chart, labs and discussed the procedure including the risks, benefits and alternatives for the proposed anesthesia with the patient or authorized representative who has indicated his/her understanding and acceptance.       Plan Discussed with: CRNA  Anesthesia Plan Comments:         Anesthesia Quick Evaluation

## 2019-01-13 NOTE — Anesthesia Procedure Notes (Signed)
Procedure Name: LMA Insertion Date/Time: 01/13/2019 9:41 AM Performed by: Esaw Grandchild, CRNA Pre-anesthesia Checklist: Emergency Drugs available, Patient identified, Suction available and Patient being monitored Patient Re-evaluated:Patient Re-evaluated prior to induction Oxygen Delivery Method: Circle system utilized Preoxygenation: Pre-oxygenation with 100% oxygen Induction Type: IV induction LMA: LMA inserted LMA Size: 5.0 Dental Injury: Teeth and Oropharynx as per pre-operative assessment

## 2019-01-15 NOTE — Anesthesia Postprocedure Evaluation (Signed)
Anesthesia Post Note  Patient: Mark Hernandez.  Procedure(s) Performed: KNEE ARTHROSCOPY WITH MEDIAL MENISECTOMY (Right Knee)  Patient location during evaluation: PACU Anesthesia Type: General Level of consciousness: awake and alert Pain management: pain level controlled Vital Signs Assessment: post-procedure vital signs reviewed and stable Respiratory status: spontaneous breathing, nonlabored ventilation, respiratory function stable and patient connected to nasal cannula oxygen Cardiovascular status: blood pressure returned to baseline and stable Postop Assessment: no apparent nausea or vomiting Anesthetic complications: no     Last Vitals:  Vitals:   01/13/19 1335 01/13/19 1420  BP: (!) 156/88 (!) 149/90  Pulse: 87 90  Resp: 16 16  Temp: 36.8 C   SpO2: 98% 95%    Last Pain:  Vitals:   01/13/19 1335  TempSrc: Temporal  PainSc: 3                  Molli Barrows

## 2019-03-12 ENCOUNTER — Other Ambulatory Visit: Payer: Self-pay

## 2019-03-12 DIAGNOSIS — N401 Enlarged prostate with lower urinary tract symptoms: Secondary | ICD-10-CM

## 2019-03-12 DIAGNOSIS — N138 Other obstructive and reflux uropathy: Secondary | ICD-10-CM

## 2019-03-13 ENCOUNTER — Other Ambulatory Visit: Payer: 59

## 2019-03-13 ENCOUNTER — Other Ambulatory Visit: Payer: Self-pay

## 2019-03-13 DIAGNOSIS — N401 Enlarged prostate with lower urinary tract symptoms: Secondary | ICD-10-CM

## 2019-03-13 DIAGNOSIS — N138 Other obstructive and reflux uropathy: Secondary | ICD-10-CM

## 2019-03-14 LAB — PSA: Prostate Specific Ag, Serum: 1 ng/mL (ref 0.0–4.0)

## 2019-03-19 ENCOUNTER — Other Ambulatory Visit: Payer: Self-pay | Admitting: Neurology

## 2019-03-19 NOTE — Telephone Encounter (Signed)
Per  registry, last refilled Vimpat 150 mg, 90 day supply, on 12/23/2018. Refills were given by Dr. Lucia Gaskins. Will send to [facility] NP who saw pt last to authorize refill. Pt has pending 1 yr f/u in November 2021.

## 2019-03-20 ENCOUNTER — Ambulatory Visit: Payer: 59 | Admitting: Urology

## 2019-04-08 ENCOUNTER — Other Ambulatory Visit: Payer: Self-pay | Admitting: Family Medicine

## 2019-04-08 ENCOUNTER — Ambulatory Visit: Payer: 59 | Admitting: Urology

## 2019-04-08 ENCOUNTER — Other Ambulatory Visit: Payer: Self-pay

## 2019-04-08 ENCOUNTER — Encounter: Payer: Self-pay | Admitting: Urology

## 2019-04-08 VITALS — BP 133/76 | HR 70 | Ht 69.0 in | Wt 215.0 lb

## 2019-04-08 DIAGNOSIS — N138 Other obstructive and reflux uropathy: Secondary | ICD-10-CM | POA: Diagnosis not present

## 2019-04-08 DIAGNOSIS — Z8042 Family history of malignant neoplasm of prostate: Secondary | ICD-10-CM

## 2019-04-08 DIAGNOSIS — N401 Enlarged prostate with lower urinary tract symptoms: Secondary | ICD-10-CM | POA: Diagnosis not present

## 2019-04-08 NOTE — Progress Notes (Signed)
03/19/2017 9:10 AM   Mark Hernandez. 1960/07/03 [id number]  Referring provider: Tonia Ghent, MD [address] [place],  [name] [address]  Chief Complaint  Patient presents with  . Benign Prostatic Hypertrophy    HPI: Mark Hernandez. is a 59 y.o. male who presents today for an annual prostate exam.  BPH WITH LUTS  (prostate and/or bladder) IPSS score: 13/1    Previous score: 11/2  Major complaint(s): Minimal bother with incomplete emptying, frequency, intermittency, urgency, weak urinary stream, straining to urinate and nocturia x1.   Patient denies any modifying or aggravating factors.  Patient denies any gross hematuria, dysuria or suprapubic/flank pain.  Patient denies any fevers, chills, nausea or vomiting.   He has a family history of PCa, with father and paternal father having had prostate cancer.  IPSS    Row Name 04/08/19 0800         International Prostate Symptom Score   How often have you had the sensation of not emptying your bladder?  Less than half the time     How often have you had to urinate less than every two hours?  Less than half the time     How often have you found you stopped and started again several times when you urinated?  Less than half the time     How often have you found it difficult to postpone urination?  Less than half the time     How often have you had a weak urinary stream?  Less than half the time     How often have you had to strain to start urination?  Less than half the time     How many times did you typically get up at night to urinate?  1 Time     Total IPSS Score  13       Quality of Life due to urinary symptoms   If you were to spend the rest of your life with your urinary condition just the way it is now how would you feel about that?  Pleased        Score:  1-7 Mild 8-19 Moderate 20-35 Severe  PMH: Past Medical History:  Diagnosis Date  . Anxiety   . Arthritis    BOTH HANDS  . BPH (benign prostatic  hypertrophy)   . Elevated PSA   . History of kidney stones   . HLD (hyperlipidemia)   . Hx MRSA infection 2009  . Nocturia   . Over weight   . Rotator cuff tear   . Seizures [facility])     Surgical History: Past Surgical History:  Procedure Laterality Date  . COLONOSCOPY  2014?  . INGUINAL HERNIA REPAIR Left 02/17/2015   Procedure: LAPAROSCOPIC INGUINAL HERNIA;  Surgeon: Christene Lye, MD;  Location: [facility];  Service: General;  Laterality: Left;  . KNEE ARTHROSCOPY WITH MEDIAL MENISECTOMY Right 01/13/2019   Procedure: KNEE ARTHROSCOPY WITH MEDIAL MENISECTOMY;  Surgeon: Thornton Park, MD;  Location: [facility];  Service: Orthopedics;  Laterality: Right;  . LITHOTRIPSY     x 2  . SHOULDER ARTHROSCOPY WITH OPEN ROTATOR CUFF REPAIR Left 09/23/2014   Procedure: SHOULDER ARTHROSCOPY , debridement, decompression, WITH  mini OPEN ROTATOR CUFF REPAIR;  Surgeon: Corky Mull, MD;  Location: [facility] OR[facility];  Service: Orthopedics;  Laterality: Left;  . SHOULDER SURGERY Bilateral    rotator cuff repair  . UMBILICAL HERNIA REPAIR N/A 02/17/2015   Procedure: LAPAROSCOPIC  UMBILICAL HERNIA;  Surgeon: Kieth Brightly, MD;  Location: [facility] OR[facility];  Service: General;  Laterality: N/A;    Home Medications:  Allergies as of 04/08/2019      Reactions   Penicillins Rash   Did it involve swelling of the face/tongue/throat, SOB, or low BP? Unknown Did it involve sudden or severe rash/hives, skin peeling, or any reaction on the inside of your mouth or nose? Unknown Did you need to seek medical attention at a hospital or doctor's office? Unknown When did it last happen?Childhood If all above answers are "NO", may proceed with cephalosporin use.      Medication List       Accurate as of April 08, 2019  9:10 AM. If you have any questions, ask your nurse or doctor.        STOP taking these medications   aspirin EC 325 MG tablet Stopped by: Damire Remedios, PA-C   HYDROcodone-acetaminophen  5-325 MG tablet Commonly known as: Norco Stopped by: Michiel Cowboy, PA-C   meloxicam 15 MG tablet Commonly known as: MOBIC Stopped by: Marte Celani, PA-C   ondansetron 4 MG tablet Commonly known as: Zofran Stopped by: Terrisa Curfman, PA-C     TAKE these medications   escitalopram 10 MG tablet Commonly known as: LEXAPRO Take 1 tablet (10 mg total) by mouth daily.   Vimpat 150 MG Tabs Generic drug: Lacosamide TAKE 1 TABLET BY MOUTH TWICE A DAY       Allergies:  Allergies  Allergen Reactions  . Penicillins Rash    Did it involve swelling of the face/tongue/throat, SOB, or low BP? Unknown Did it involve sudden or severe rash/hives, skin peeling, or any reaction on the inside of your mouth or nose? Unknown Did you need to seek medical attention at a hospital or doctor's office? Unknown When did it last happen?Childhood If all above answers are "NO", may proceed with cephalosporin use.     Family History: Family History  Problem Relation Age of Onset  . COPD Mother   . Prostate cancer Father   . Colon cancer Paternal Grandmother   . Kidney disease Paternal Uncle   . Kidney cancer Paternal Uncle     Social History:  reports that he has never smoked. He quit smokeless tobacco use about 14 years ago.  His smokeless tobacco use included chew. He reports current alcohol use. He reports that he does not use drugs.  ROS: For pertinent review of systems please refer to history of present illness  Physical Exam: BP 133/76   Pulse 70   Ht 5\' 9"  (1.753 m)   Wt 215 lb (97.5 kg)   BMI 31.75 kg/m   Constitutional:  Well nourished. Alert and oriented, No acute distress. HEENT: [place] AT, mask in place.  Trachea midline, no masses. Cardiovascular: No clubbing, cyanosis, or edema. Respiratory: Normal respiratory effort, no increased work of breathing. GI: Abdomen is soft, non tender, non distended, no abdominal masses.   GU: No CVA tenderness.  No bladder fullness or  masses.  Patient with circumcised phallus.  Urethral meatus is patent.  No penile discharge. No penile lesions or rashes. Scrotum without lesions, cysts, rashes and/or edema.  Testicles are located scrotally bilaterally. No masses are appreciated in the testicles. Left and right epididymis are normal. Rectal: Patient with  normal sphincter tone. Anus and perineum without scarring or rashes. No rectal masses are appreciated. Prostate is approximately 50 grams, could only palpate the apex and the midportion of the glands,  no nodules are appreciated. Seminal vesicles could not be palpated Skin: No rashes, bruises or suspicious lesions. Lymph: No inguinal adenopathy. Neurologic: Grossly intact, no focal deficits, moving all 4 extremities. Psychiatric: Normal mood and affect.   Laboratory Data: Lab Results  Component Value Date   CREATININE 0.75 01/09/2019      Component Value Date/Time   CHOL 190 08/20/2018 0803   HDL 44.80 08/20/2018 0803   CHOLHDL 4 08/20/2018 0803   VLDL 23.4 08/20/2018 0803   LDLCALC 122 (H) 08/20/2018 0803   Lab Results  Component Value Date   AST 15 08/20/2018   Lab Results  Component Value Date   ALT 11 08/20/2018   PSA history  1.2 ng/mL on 03/05/2012  0.8 ng/mL on 05/01/2012  1.0 ng/mL on 03/05/2013  0.8 ng/mL on 03/05/2014  Component     Latest Ref Rng & Units 03/18/2015 03/12/2016 03/12/2017 03/12/2018  Prostate Specific Ag, Serum     0.0 - 4.0 ng/mL 1.0 1.7 1.0 0.9   Component     Latest Ref Rng & Units 03/13/2019  Prostate Specific Ag, Serum     0.0 - 4.0 ng/mL 1.0   I have reviewed the labs.  Assessment & Plan:    1. BPH with LUTS - IPSS score is 13/1, it is stable - Continue conservative management, avoiding bladder irritants and timed voiding's - RTC in 12 months for IPSS, PSA and exam   2. Family history of prostate cancer: - Patient's father and paternal grandfather had been diagnosed with prostate cancer.  Patient will have yearly  prostate exams and PSA's.   Return in about 1 year (around 04/07/2020) for IPSS, PSA and exam.  Michiel Cowboy, [facility]  [facility] [address] [place], [place] [address] [phone]

## 2019-08-19 ENCOUNTER — Other Ambulatory Visit: Payer: Self-pay | Admitting: Family Medicine

## 2019-08-19 DIAGNOSIS — E785 Hyperlipidemia, unspecified: Secondary | ICD-10-CM

## 2019-08-26 ENCOUNTER — Other Ambulatory Visit: Payer: Self-pay

## 2019-08-26 ENCOUNTER — Other Ambulatory Visit (INDEPENDENT_AMBULATORY_CARE_PROVIDER_SITE_OTHER): Payer: 59

## 2019-08-26 DIAGNOSIS — E785 Hyperlipidemia, unspecified: Secondary | ICD-10-CM

## 2019-08-26 LAB — COMPREHENSIVE METABOLIC PANEL
ALT: 13 U/L (ref 0–53)
AST: 18 U/L (ref 0–37)
Albumin: 4.1 g/dL (ref 3.5–5.2)
Alkaline Phosphatase: 61 U/L (ref 39–117)
BUN: 20 mg/dL (ref 6–23)
CO2: 27 mEq/L (ref 19–32)
Calcium: 9.6 mg/dL (ref 8.4–10.5)
Chloride: 105 mEq/L (ref 96–112)
Creatinine, Ser: 0.78 mg/dL (ref 0.40–1.50)
GFR: 101.96 mL/min (ref >60.00)
Glucose, Bld: 102 mg/dL — ABNORMAL HIGH (ref 70–99)
Potassium: 4.4 mEq/L (ref 3.5–5.1)
Sodium: 138 mEq/L (ref 135–145)
Total Bilirubin: 0.5 mg/dL (ref 0.2–1.2)
Total Protein: 7.1 g/dL (ref 6.0–8.3)

## 2019-08-26 LAB — LIPID PANEL
Cholesterol: 178 mg/dL (ref 0–200)
HDL: 45.7 mg/dL (ref >39.00)
LDL Cholesterol: 115 mg/dL — ABNORMAL HIGH (ref 0–99)
NonHDL: 132.52
Total CHOL/HDL Ratio: 4
Triglycerides: 86 mg/dL (ref 0.0–149.0)
VLDL: 17.2 mg/dL (ref 0.0–40.0)

## 2019-08-31 ENCOUNTER — Other Ambulatory Visit: Payer: Self-pay

## 2019-08-31 ENCOUNTER — Encounter: Payer: Self-pay | Admitting: Family Medicine

## 2019-08-31 ENCOUNTER — Ambulatory Visit (INDEPENDENT_AMBULATORY_CARE_PROVIDER_SITE_OTHER): Payer: 59 | Admitting: Family Medicine

## 2019-08-31 VITALS — BP 128/82 | HR 75 | Temp 97.7°F | Ht 69.0 in | Wt 209.0 lb

## 2019-08-31 DIAGNOSIS — Z23 Encounter for immunization: Secondary | ICD-10-CM | POA: Diagnosis not present

## 2019-08-31 DIAGNOSIS — Z7189 Other specified counseling: Secondary | ICD-10-CM

## 2019-08-31 DIAGNOSIS — Z0000 Encounter for general adult medical examination without abnormal findings: Secondary | ICD-10-CM

## 2019-08-31 DIAGNOSIS — F419 Anxiety disorder, unspecified: Secondary | ICD-10-CM

## 2019-08-31 DIAGNOSIS — M792 Neuralgia and neuritis, unspecified: Secondary | ICD-10-CM

## 2019-08-31 DIAGNOSIS — M25561 Pain in right knee: Secondary | ICD-10-CM

## 2019-08-31 DIAGNOSIS — R569 Unspecified convulsions: Secondary | ICD-10-CM

## 2019-08-31 DIAGNOSIS — E785 Hyperlipidemia, unspecified: Secondary | ICD-10-CM

## 2019-08-31 MED ORDER — PREDNISONE 10 MG PO TABS: ORAL_TABLET | ORAL | 0 refills | Status: DC

## 2019-08-31 MED ORDER — ATORVASTATIN CALCIUM 10 MG PO TABS: 10.0000 mg | ORAL_TABLET | Freq: Every day | ORAL | 3 refills | Status: DC

## 2019-08-31 MED ORDER — ESCITALOPRAM OXALATE 10 MG PO TABS: 10.0000 mg | ORAL_TABLET | Freq: Every day | ORAL | 3 refills | Status: DC

## 2019-08-31 NOTE — Patient Instructions (Signed)
Thanks for getting a covid vaccine.  Tetanus shot today.  I would get a flu shot each fall.    Take prednisone for your arm and neck and update me as needed.    Later on, try lipitor.  If tolerated, recheck fasting labs about 2 months after starting medicine.  If you have more aches on the med, then stop it and let me know.   Take care.  Glad to see you.

## 2019-08-31 NOTE — Progress Notes (Signed)
This visit occurred during the SARS-CoV-2 public health emergency.  Safety protocols were in place, including screening questions prior to the visit, additional usage of staff PPE, and extensive cleaning of exam room while observing appropriate contact time as indicated for disinfecting solutions.  CPE- See plan.  Routine anticipatory guidance given to patient.  See health maintenance.  The possibility exists that previously documented standard health maintenance information may have been brought forward from a previous encounter into this note.  If needed, that same information has been updated to reflect the current situation based on today's encounter.    Tetanus done at work, 2021 Flu done yearly, d/w pt.  PNA due at 65.  shingles due at 60.   covid vaccine prev done.   PSA screening- done by uro, I'll defer, d/w pt.  Colonoscopy done prev, 2014. Was told 10 year f/u. Done per [name].  Living will d/w pt. Wife designated if patient were incapacitated.  Diet and exercise d/w pt. Encouraged both.  HCV and HIV screening prev neg.  Labs d/w pt.  He had radicular electrical pain and numbness in the R arm/hand after kayaking about 1 month ago.  No trauma.  No L arm sx.  No R leg troubles.  He is better than prev but not resolved.    He is putting up with knee pain.  He has daily pain.  He is trying to put off knee replacement.  Taking tylenol for pain.  He has rx for meloxicam to use prn.  Icing helps.    Mood d/w pt.  Mood is good.  No ADE on med.  Medication helps with anxiety.  No SI/HI.    SZ per neuro.  I'll defer.  He agrees.  No events.  Compliant.    calcified coronary artery atherosclerosis prev noted on imaging.  D/w pt.    He passed recent recent kidney stones.  D/w pt.  Cautions d/w pt.    PMH and SH reviewed  Meds, vitals, and allergies reviewed.   ROS: Per HPI.  Unless specifically indicated otherwise in HPI, the patient denies:  General: fever. Eyes: acute  vision changes ENT: sore throat Cardiovascular: chest pain Respiratory: SOB GI: vomiting GU: dysuria Musculoskeletal: acute back pain Derm: acute rash Neuro: acute motor dysfunction Psych: worsening mood Endocrine: polydipsia Heme: bleeding Allergy: hayfever  GEN: nad, alert and oriented HEENT: ncat, normal range of motion.  Right trapezius is sore without rash or bruising. NECK: supple w/o LA CV: rrr. PULM: ctab, no inc wob ABD: soft, +bs EXT: no edema SKIN: no acute rash CN 2-12 wnl B, S/S/DTR wnl B

## 2019-09-02 DIAGNOSIS — M792 Neuralgia and neuritis, unspecified: Secondary | ICD-10-CM | POA: Insufficient documentation

## 2019-09-02 NOTE — Assessment & Plan Note (Signed)
Without weakness.  Some better in the meantime.  No leg symptoms.  Discussed options.  He can take prednisone with routine steroid cautions and update me as needed.  He agrees.

## 2019-09-02 NOTE — Assessment & Plan Note (Signed)
Living will d/w pt.  Wife designated if patient were incapacitated.   ?

## 2019-09-02 NOTE — Assessment & Plan Note (Signed)
Tetanus done at work, 2011 Flu done yearly, d/w pt.  PNA due at 65.  shingles due at 60.   covid vaccine prev done.   PSA screening- done by uro, I'll defer, d/w pt.  Colonoscopy done prev, 2014. Was told 10 year f/u. Done per [name].  Living will d/w pt. Wife designated if patient were incapacitated.  Diet and exercise d/w pt. Encouraged both.  HCV and HIV screening prev neg.  Labs d/w pt.

## 2019-09-02 NOTE — Assessment & Plan Note (Signed)
SZ per neuro.  I'll defer.  He agrees.  No events.  Compliant.

## 2019-09-02 NOTE — Assessment & Plan Note (Signed)
He is putting up with knee pain.  He has daily pain.  He is trying to put off knee replacement.  Taking tylenol for pain.  He has rx for meloxicam to use prn.  Icing helps.

## 2019-09-02 NOTE — Assessment & Plan Note (Signed)
Mood d/w pt.  Mood is good.  No ADE on med.  Medication helps with anxiety.  No SI/HI.   Continue Lexapro.

## 2019-09-02 NOTE — Assessment & Plan Note (Signed)
With calcified coronary artery atherosclerosis prev noted on imaging.  D/w pt. reasonable to try Lipitor 10 mg a day with routine cautions and he will update me as needed.  See after visit summary.

## 2019-09-20 ENCOUNTER — Other Ambulatory Visit: Payer: Self-pay | Admitting: Adult Health

## 2019-11-30 ENCOUNTER — Other Ambulatory Visit: Payer: Self-pay | Admitting: Family Medicine

## 2019-12-20 NOTE — Progress Notes (Addendum)
PATIENT: Mark Hernandez. DOB: 1960-12-15  REASON FOR VISIT: follow up HISTORY FROM: patient  HISTORY OF PRESENT ILLNESS: Today 12/20/19: Mark Hernandez is a 59 year old male with a history of seizures.  He returns today for follow-up.  He denies any seizure events.  Continues on Vimpat 150 mg twice a day.  He is able to complete all ADLs independently.  Operates a Librarian, academic.  Returns today for follow-up.  12/18/18:Mark Hernandez is a 59 year old male with a history of seizures.  He returns today for follow-up.  He continues on Vimpat 150 mg twice a day.  He denies any seizure events.  He is able to complete all ADLs independently.  He operates a Librarian, academic without difficulty.  No change in his mood or behavior.  No change in his gait or balance.  Overall he feels that he is doing well.  He returns today for an evaluation  HISTORY 12/16/17:  Mark Hernandez is a 59 year old male with a history of seizures.  He returns today for follow-up.  He denies any seizure events.  He is able to complete all ADLs independently.  Denies any changes in his mood or behavior.  No change in his gait or balance.  He operates a Librarian, academic without difficulty.  He remains on Vimpat.  He reports in August he went to the ED.  Reports that he had muscle strain after kayaking and woke up with paralysis in his right hand.  He was diagnosed with radial nerve palsy.  He is given steroids but physical therapy was not ordered.  He states that he has had 90% improvement but still has trouble with flexion of his right wrist and extension of the digits.  He returns today for an evaluation   REVIEW OF SYSTEMS: Out of a complete 14 system review of symptoms, the patient complains only of the following symptoms, and all other reviewed systems are negative.  See HPI  ALLERGIES: Allergies  Allergen Reactions  . Penicillins Rash    Did it involve swelling of the face/tongue/throat, SOB, or low BP? Unknown Did it involve sudden or  severe rash/hives, skin peeling, or any reaction on the inside of your mouth or nose? Unknown Did you need to seek medical attention at a hospital or doctor's office? Unknown When did it last happen?Childhood If all above answers are "NO", may proceed with cephalosporin use.     HOME MEDICATIONS: Outpatient Medications Prior to Visit  Medication Sig Dispense Refill  . VIMPAT 150 MG TABS TAKE 1 TABLET BY MOUTH TWICE A DAY 180 tablet 1  . atorvastatin (LIPITOR) 10 MG tablet Take 1 tablet (10 mg total) by mouth daily. 90 tablet 3  . escitalopram (LEXAPRO) 10 MG tablet TAKE 1 TABLET BY MOUTH ONCE DAILY 90 tablet 1  . meloxicam (MOBIC) 15 MG tablet Take 15 mg by mouth daily as needed for pain.    . predniSONE (DELTASONE) 10 MG tablet Take 2 a day for 5 days, then 1 a day for 5 days, with food. Don't take with aleve/ibuprofen/meloxicam. 15 tablet 0   No facility-administered medications prior to visit.    PAST MEDICAL HISTORY: Past Medical History:  Diagnosis Date  . Anxiety   . Arthritis    BOTH HANDS  . BPH (benign prostatic hypertrophy)   . Elevated PSA   . History of kidney stones   . HLD (hyperlipidemia)   . Hx MRSA infection 2009  . Nocturia   . Over weight   .  Rotator cuff tear   . Seizures (HCC)     PAST SURGICAL HISTORY: Past Surgical History:  Procedure Laterality Date  . COLONOSCOPY  2014?  . INGUINAL HERNIA REPAIR Left 02/17/2015   Procedure: LAPAROSCOPIC INGUINAL HERNIA;  Surgeon: Kieth Brightly, MD;  Location: [facility];  Service: General;  Laterality: Left;  . KNEE ARTHROSCOPY WITH MEDIAL MENISECTOMY Right 01/13/2019   Procedure: KNEE ARTHROSCOPY WITH MEDIAL MENISECTOMY;  Surgeon: Juanell Fairly, MD;  Location: [facility];  Service: Orthopedics;  Laterality: Right;  . LITHOTRIPSY     x 2  . SHOULDER ARTHROSCOPY WITH OPEN ROTATOR CUFF REPAIR Left 09/23/2014   Procedure: SHOULDER ARTHROSCOPY , debridement, decompression, WITH  mini OPEN ROTATOR CUFF  REPAIR;  Surgeon: Christena Flake, MD;  Location: [facility];  Service: Orthopedics;  Laterality: Left;  . SHOULDER SURGERY Bilateral    rotator cuff repair  . UMBILICAL HERNIA REPAIR N/A 02/17/2015   Procedure: LAPAROSCOPIC UMBILICAL HERNIA;  Surgeon: Kieth Brightly, MD;  Location: [facility];  Service: General;  Laterality: N/A;    FAMILY HISTORY: Family History  Problem Relation Age of Onset  . COPD Mother   . Lupus Mother   . Prostate cancer Father   . Colon cancer Paternal Grandmother   . Kidney disease Paternal Uncle   . Kidney cancer Paternal Uncle     SOCIAL HISTORY: Social History   Socioeconomic History  . Marital status: Married    Spouse name: Rosalita Chessman  . Number of children: 2  . Years of education: 71  . Highest education level: Not on file  Occupational History  . Occupation: Engineer, structural   Tobacco Use  . Smoking status: Never Smoker  . Smokeless tobacco: Former Neurosurgeon    Types: Engineer, drilling  . Vaping Use: Never used  Substance and Sexual Activity  . Alcohol use: Yes    Comment: occ  . Drug use: No  . Sexual activity: Not on file  Other Topics Concern  . 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 Difficulty of Paying Living Expenses: Not on file  Food Insecurity:   . Worried About Programme researcher, broadcasting/film/video in the Last Year: Not on file  . Ran Out of Food in the Last Year: Not on file  Transportation Needs:   . Lack of Transportation (Medical): Not on file  . Lack of Transportation (Non-Medical): Not on file  Physical Activity:   . Days of Exercise per Week: Not on file  . Minutes of Exercise per Session: Not on file  Stress:   . Feeling of Stress : Not on file  Social Connections:   . Frequency of Communication with Friends and Family: Not on  file  . Frequency of Social Gatherings with Friends and Family: Not on file  . Attends Religious Services: Not on file  . Active Member of Clubs or Organizations: Not on file  . Attends Banker Meetings: Not on file  . Marital Status: Not on file  Intimate Partner Violence:   . Fear of Current or Ex-Partner: Not on file  . Emotionally Abused: Not on file  . Physically Abused: Not on file  . Sexually Abused: Not on file      PHYSICAL EXAM  Vitals:   12/21/19 0846  BP: 138/72  Pulse: 81  Weight: 205 lb 12.8 oz (93.4 kg)  Height: 5\' 9"  (1.753 m)   Body mass index is 30.39 kg/m.  Generalized: Well developed, in no acute distress   Neurological examination  Mentation: Alert oriented to time, place, history taking. Follows all commands speech and language fluent Cranial nerve II-XII: Pupils were equal round reactive to light. Extraocular movements were full, visual field were full on confrontational test. Head turning and shoulder shrug  were normal and symmetric. Motor: The motor testing reveals 5 over 5 strength of all 4 extremities. Good symmetric motor tone is noted throughout.  Sensory: Sensory testing is intact to soft touch on all 4 extremities. No evidence of extinction is noted.  Coordination: Cerebellar testing reveals good finger-nose-finger and heel-to-shin bilaterally.  Gait and station: Gait is normal.  Reflexes: Deep tendon reflexes are symmetric and normal bilaterally.   DIAGNOSTIC DATA (LABS, IMAGING, TESTING) - I reviewed patient records, labs, notes, testing and imaging myself where available.  Lab Results  Component Value Date   WBC 7.1 01/09/2019   HGB 13.4 01/09/2019   HCT 40.0 01/09/2019   MCV 87.7 01/09/2019   PLT 206 01/09/2019      Component Value Date/Time   NA 138 08/26/2019 0814   K 4.4 08/26/2019 0814   CL 105 08/26/2019 0814   CO2 27 08/26/2019 0814   GLUCOSE 102 (H) 08/26/2019 0814   BUN 20 08/26/2019 0814   CREATININE  0.78 08/26/2019 0814   CALCIUM 9.6 08/26/2019 0814   PROT 7.1 08/26/2019 0814   ALBUMIN 4.1 08/26/2019 0814   AST 18 08/26/2019 0814   ALT 13 08/26/2019 0814   ALKPHOS 61 08/26/2019 0814   BILITOT 0.5 08/26/2019 0814   GFRNONAA >60 01/09/2019 0810   GFRAA >60 01/09/2019 0810   Lab Results  Component Value Date   CHOL 178 08/26/2019   HDL 45.70 08/26/2019   LDLCALC 115 (H) 08/26/2019   TRIG 86.0 08/26/2019   CHOLHDL 4 08/26/2019      ASSESSMENT AND PLAN 59 y.o. year old male  has a past medical history of Anxiety, Arthritis, BPH (benign prostatic hypertrophy), Elevated PSA, History of kidney stones, HLD (hyperlipidemia), MRSA infection (2009), Nocturia, Over weight, Rotator cuff tear, and Seizures (HCC). here with :  1.  Seizures  -Stable -Continue Vimpat 150 mg twice a day -Follow-up in 1 year or sooner if needed  I spent 25 minutes of face-to-face and non-face-to-face time with patient.  This included previsit chart review, lab review, study review, order entry, electronic health record documentation, patient education.    06-24-1984, MSN, NP-C 12/20/2019, 8:40 AM [facility] [address] [place], [place] [phone]  Made any corrections needed, and agree with history, physical, neuro exam,assessment and plan as stated.     [phone], MD [facility]  Made any corrections needed, and agree with history, physical, neuro exam,assessment and plan as stated.     Naomie Dean, MD [facility]

## 2019-12-20 NOTE — Patient Instructions (Signed)
Your Plan:  Continue Vimpat 150 mg twice a day If your symptoms worsen or you develop new symptoms please let us know.   Thank you for coming to see us at [facility]. I hope we have been able to provide you high quality care today.  You may receive a patient satisfaction survey over the next few weeks. We would appreciate your feedback and comments so that we may continue to improve ourselves and the health of our patients.  

## 2019-12-21 ENCOUNTER — Encounter: Payer: Self-pay | Admitting: Adult Health

## 2019-12-21 ENCOUNTER — Ambulatory Visit: Payer: 59 | Admitting: Adult Health

## 2019-12-21 ENCOUNTER — Other Ambulatory Visit: Payer: Self-pay

## 2019-12-21 VITALS — BP 138/72 | HR 81 | Ht 69.0 in | Wt 205.8 lb

## 2019-12-21 DIAGNOSIS — R569 Unspecified convulsions: Secondary | ICD-10-CM

## 2020-03-17 ENCOUNTER — Other Ambulatory Visit: Payer: Self-pay | Admitting: Adult Health

## 2020-04-01 ENCOUNTER — Other Ambulatory Visit: Payer: 59

## 2020-04-01 ENCOUNTER — Other Ambulatory Visit: Payer: Self-pay

## 2020-04-01 DIAGNOSIS — N138 Other obstructive and reflux uropathy: Secondary | ICD-10-CM

## 2020-04-02 LAB — PSA: Prostate Specific Ag, Serum: 1 ng/mL (ref 0.0–4.0)

## 2020-04-06 NOTE — Progress Notes (Signed)
03/19/2017 11:28 AM   Mark Hernandez. 03/14/1960 [id number]  Referring provider: Joaquim Nam, MD [address] [place],  [place] [address]  Chief Complaint  Patient presents with  . Benign Prostatic Hypertrophy   Urological history: 1. BPH with LU TS - PSA 1.0 in 03/2020 - I PSS 16/2  2. Family history of prostate cancer - father and paternal father with prostate cancer  3. Nephrolithiasis - last incident in 2010  HPI: Mark Hernandez. is a 60 y.o. male who presents today for an annual prostate exam.  He is not having urinary complaints, but he is having a lower left sided pain that radiates into his scrotum.  This has been occurring off and on for the last year.  He has not felt a bulge.  He states it doesn't bother him much during the day, but it can bother him during the night.    Patient denies any modifying or aggravating factors.  Patient denies any gross hematuria, dysuria or suprapubic/flank pain.  Patient denies any fevers, chills, nausea or vomiting.   UA is benign.    IPSS    Row Name 04/07/20 0800         International Prostate Symptom Score   How often have you had the sensation of not emptying your bladder? About half the time     How often have you had to urinate less than every two hours? Less than half the time     How often have you found you stopped and started again several times when you urinated? Less than half the time     How often have you found it difficult to postpone urination? About half the time     How often have you had a weak urinary stream? Less than half the time     How often have you had to strain to start urination? Less than half the time     How many times did you typically get up at night to urinate? 2 Times     Total IPSS Score 16           Quality of Life due to urinary symptoms   If you were to spend the rest of your life with your urinary condition just the way it is now how would you feel about that?  Mostly Satisfied            Score:  1-7 Mild 8-19 Moderate 20-35 Severe  PMH: Past Medical History:  Diagnosis Date  . Anxiety   . Arthritis    BOTH HANDS  . BPH (benign prostatic hypertrophy)   . Elevated PSA   . History of kidney stones   . HLD (hyperlipidemia)   . Hx MRSA infection 2009  . Nocturia   . Over weight   . Rotator cuff tear   . Seizures [facility])     Surgical History: Past Surgical History:  Procedure Laterality Date  . COLONOSCOPY  2014?  . INGUINAL HERNIA REPAIR Left 02/17/2015   Procedure: LAPAROSCOPIC INGUINAL HERNIA;  Surgeon: Kieth Brightly, MD;  Location: [facility];  Service: General;  Laterality: Left;  . KNEE ARTHROSCOPY WITH MEDIAL MENISECTOMY Right 01/13/2019   Procedure: KNEE ARTHROSCOPY WITH MEDIAL MENISECTOMY;  Surgeon: Juanell Fairly, MD;  Location: [facility];  Service: Orthopedics;  Laterality: Right;  . LITHOTRIPSY     x 2  . SHOULDER ARTHROSCOPY WITH OPEN ROTATOR CUFF REPAIR Left 09/23/2014   Procedure: SHOULDER  ARTHROSCOPY , debridement, decompression, WITH  mini OPEN ROTATOR CUFF REPAIR;  Surgeon: Christena Flake, MD;  Location: [facility];  Service: Orthopedics;  Laterality: Left;  . SHOULDER SURGERY Bilateral    rotator cuff repair  . UMBILICAL HERNIA REPAIR N/A 02/17/2015   Procedure: LAPAROSCOPIC UMBILICAL HERNIA;  Surgeon: Kieth Brightly, MD;  Location: [facility];  Service: General;  Laterality: N/A;    Home Medications:  Allergies as of 04/07/2020      Reactions   Penicillins Rash   Did it involve swelling of the face/tongue/throat, SOB, or low BP? Unknown Did it involve sudden or severe rash/hives, skin peeling, or any reaction on the inside of your mouth or nose? Unknown Did you need to seek medical attention at a hospital or doctor's office? Unknown When did it last happen?Childhood If all above answers are "NO", may proceed with cephalosporin use.      Medication List       Accurate as of April 07, 2020  11:59 PM. If you have any questions, ask your nurse or doctor.        atorvastatin 10 MG tablet Commonly known as: LIPITOR Take 1 tablet (10 mg total) by mouth daily.   escitalopram 10 MG tablet Commonly known as: LEXAPRO TAKE 1 TABLET BY MOUTH ONCE DAILY   Vimpat 150 MG Tabs Generic drug: Lacosamide TAKE 1 TABLET BY MOUTH TWICE A DAY       Allergies:  Allergies  Allergen Reactions  . Penicillins Rash    Did it involve swelling of the face/tongue/throat, SOB, or low BP? Unknown Did it involve sudden or severe rash/hives, skin peeling, or any reaction on the inside of your mouth or nose? Unknown Did you need to seek medical attention at a hospital or doctor's office? Unknown When did it last happen?Childhood If all above answers are "NO", may proceed with cephalosporin use.     Family History: Family History  Problem Relation Age of Onset  . COPD Mother   . Lupus Mother   . Prostate cancer Father   . Colon cancer Paternal Grandmother   . Kidney disease Paternal Uncle   . Kidney cancer Paternal Uncle     Social History:  reports that he has never smoked. He quit smokeless tobacco use about 15 years ago.  His smokeless tobacco use included chew. He reports current alcohol use. He reports that he does not use drugs.  ROS: For pertinent review of systems please refer to history of present illness  Physical Exam: BP (!) 160/94   Pulse 73   Ht 5\' 9"  (1.753 m)   Wt 206 lb (93.4 kg)   BMI 30.42 kg/m   Constitutional:  Well nourished. Alert and oriented, No acute distress. HEENT: [place] AT, mask in place.  Trachea midline Cardiovascular: No clubbing, cyanosis, or edema. Respiratory: Normal respiratory effort, no increased work of breathing. GU: No CVA tenderness.  No bladder fullness or masses.  Patient with circumcised phallus.  Urethral meatus is patent.  No penile discharge. No penile lesions or rashes. Scrotum without lesions, cysts, rashes and/or edema.   Testicles are located scrotally bilaterally. No masses are appreciated in the testicles. Left and right epididymis are normal.  Bilateral hydroceles.   Rectal: Patient with  normal sphincter tone. Anus and perineum without scarring or rashes. No rectal masses are appreciated. Prostate is approximately 50 grams, no nodules are appreciated. Seminal vesicles could not be palpated.   Lymph: No inguinal adenopathy. Neurologic: Grossly intact, no  focal deficits, moving all 4 extremities. Psychiatric: Normal mood and affect. Large vascular vein clusters on the left calf   Laboratory Data: Lab Results  Component Value Date   CREATININE 0.78 08/26/2019      Component Value Date/Time   CHOL 178 08/26/2019 0814   HDL 45.70 08/26/2019 0814   CHOLHDL 4 08/26/2019 0814   VLDL 17.2 08/26/2019 0814   LDLCALC 115 (H) 08/26/2019 0814   Lab Results  Component Value Date   AST 18 08/26/2019   Lab Results  Component Value Date   ALT 13 08/26/2019   PSA history  1.2 ng/mL on 03/05/2012  0.8 ng/mL on 05/01/2012  1.0 ng/mL on 03/05/2013  0.8 ng/mL on 03/05/2014  Component     Latest Ref Rng & Units 03/18/2015  Prostate Specific Ag, Serum    0.0 - 4.0 ng/mL 1.0   Component     Latest Ref Rng & Units 03/12/2016 03/12/2017 03/12/2018 03/13/2019  Prostate Specific Ag, Serum     0.0 - 4.0 ng/mL 1.7 1.0 0.9 1.0   Component     Latest Ref Rng & Units 04/01/2020  Prostate Specific Ag, Serum     0.0 - 4.0 ng/mL 1.0   Urinalysis Component     Latest Ref Rng & Units 04/07/2020  Specific Gravity, UA     1.005 - 1.030 1.020  pH, UA     5.0 - 7.5 7.0  Color, UA     Yellow Yellow  Appearance Ur     Clear Clear  Leukocytes,UA     Negative Negative  Protein,UA     Negative/Trace Negative  Glucose, UA     Negative Negative  Ketones, UA     Negative Negative  RBC, UA     Negative Negative  Bilirubin, UA     Negative Negative  Urobilinogen, Ur     0.2 - 1.0 mg/dL 0.2  Nitrite, UA     Negative  Negative  Microscopic Examination      See below:   Component     Latest Ref Rng & Units 04/07/2020  WBC, UA     0 - 5 /hpf None seen  RBC     0 - 2 /hpf None seen  Epithelial Cells (non renal)     0 - 10 /hpf 0-10  Bacteria, UA     None seen/Few None seen   I have reviewed the labs.  Assessment & Plan:    1. BPH with LUTS - IPSS score is slightly worse - Continue conservative management, avoiding bladder irritants and timed voiding's  2. Family history of prostate cancer: - Patient's father and paternal grandfather had been diagnosed with prostate cancer.  Patient will have yearly prostate exams and PSA's  3. Scrotal pain - likely due to hydroceles  - will obtain a scrotal ultrasound for confirmation  - will call for results    Return in about 1 year (around 04/07/2021) for IPSS, PSA and exam.  Michiel Cowboy, [facility]  [facility] [address] [place], [place] [address] [phone]

## 2020-04-07 ENCOUNTER — Encounter: Payer: Self-pay | Admitting: Urology

## 2020-04-07 ENCOUNTER — Other Ambulatory Visit: Payer: Self-pay

## 2020-04-07 ENCOUNTER — Ambulatory Visit: Payer: 59 | Admitting: Urology

## 2020-04-07 VITALS — BP 160/94 | HR 73 | Ht 69.0 in | Wt 206.0 lb

## 2020-04-07 DIAGNOSIS — N401 Enlarged prostate with lower urinary tract symptoms: Secondary | ICD-10-CM

## 2020-04-07 DIAGNOSIS — N433 Hydrocele, unspecified: Secondary | ICD-10-CM | POA: Diagnosis not present

## 2020-04-07 DIAGNOSIS — N138 Other obstructive and reflux uropathy: Secondary | ICD-10-CM

## 2020-04-07 DIAGNOSIS — Z8042 Family history of malignant neoplasm of prostate: Secondary | ICD-10-CM

## 2020-04-07 LAB — MICROSCOPIC EXAMINATION
Bacteria, UA: NONE SEEN
RBC, Urine: NONE SEEN /hpf (ref 0–2)
WBC, UA: NONE SEEN /hpf (ref 0–5)

## 2020-04-07 LAB — URINALYSIS, COMPLETE
Bilirubin, UA: NEGATIVE
Glucose, UA: NEGATIVE
Ketones, UA: NEGATIVE
Leukocytes,UA: NEGATIVE
Nitrite, UA: NEGATIVE
Protein,UA: NEGATIVE
RBC, UA: NEGATIVE
Specific Gravity, UA: 1.02 (ref 1.005–1.030)
Urobilinogen, Ur: 0.2 mg/dL (ref 0.2–1.0)
pH, UA: 7 (ref 5.0–7.5)

## 2020-04-25 ENCOUNTER — Other Ambulatory Visit: Payer: Self-pay

## 2020-04-25 ENCOUNTER — Ambulatory Visit
Admission: RE | Admit: 2020-04-25 | Discharge: 2020-04-25 | Disposition: A | Discharge status: Home / Self Care | Payer: 59 | Source: Ambulatory Visit | Attending: Urology | Admitting: Urology

## 2020-04-25 DIAGNOSIS — N433 Hydrocele, unspecified: Secondary | ICD-10-CM | POA: Diagnosis present

## 2020-08-21 ENCOUNTER — Other Ambulatory Visit: Payer: Self-pay | Admitting: Family Medicine

## 2020-08-21 DIAGNOSIS — E785 Hyperlipidemia, unspecified: Secondary | ICD-10-CM

## 2020-08-25 ENCOUNTER — Other Ambulatory Visit: Payer: Self-pay

## 2020-08-25 ENCOUNTER — Other Ambulatory Visit (INDEPENDENT_AMBULATORY_CARE_PROVIDER_SITE_OTHER): Payer: 59

## 2020-08-25 DIAGNOSIS — E785 Hyperlipidemia, unspecified: Secondary | ICD-10-CM

## 2020-08-25 LAB — COMPREHENSIVE METABOLIC PANEL
ALT: 10 U/L (ref 0–53)
AST: 16 U/L (ref 0–37)
Albumin: 4 g/dL (ref 3.5–5.2)
Alkaline Phosphatase: 61 U/L (ref 39–117)
BUN: 18 mg/dL (ref 6–23)
CO2: 27 mEq/L (ref 19–32)
Calcium: 9.4 mg/dL (ref 8.4–10.5)
Chloride: 104 mEq/L (ref 96–112)
Creatinine, Ser: 0.87 mg/dL (ref 0.40–1.50)
GFR: 94.28 mL/min (ref >60.00)
Glucose, Bld: 95 mg/dL (ref 70–99)
Potassium: 4.4 mEq/L (ref 3.5–5.1)
Sodium: 138 mEq/L (ref 135–145)
Total Bilirubin: 0.5 mg/dL (ref 0.2–1.2)
Total Protein: 7.3 g/dL (ref 6.0–8.3)

## 2020-08-25 LAB — LIPID PANEL
Cholesterol: 182 mg/dL (ref 0–200)
HDL: 44.9 mg/dL (ref >39.00)
LDL Cholesterol: 119 mg/dL — ABNORMAL HIGH (ref 0–99)
NonHDL: 137.11
Total CHOL/HDL Ratio: 4
Triglycerides: 90 mg/dL (ref 0.0–149.0)
VLDL: 18 mg/dL (ref 0.0–40.0)

## 2020-09-01 ENCOUNTER — Ambulatory Visit (INDEPENDENT_AMBULATORY_CARE_PROVIDER_SITE_OTHER): Payer: 59 | Admitting: Family Medicine

## 2020-09-01 ENCOUNTER — Other Ambulatory Visit: Payer: Self-pay

## 2020-09-01 ENCOUNTER — Encounter: Payer: Self-pay | Admitting: Family Medicine

## 2020-09-01 VITALS — BP 140/82 | HR 65 | Temp 98.4°F | Ht 69.0 in | Wt 212.0 lb

## 2020-09-01 DIAGNOSIS — F419 Anxiety disorder, unspecified: Secondary | ICD-10-CM

## 2020-09-01 DIAGNOSIS — Z7189 Other specified counseling: Secondary | ICD-10-CM

## 2020-09-01 DIAGNOSIS — Z0000 Encounter for general adult medical examination without abnormal findings: Secondary | ICD-10-CM | POA: Diagnosis not present

## 2020-09-01 DIAGNOSIS — R569 Unspecified convulsions: Secondary | ICD-10-CM

## 2020-09-01 DIAGNOSIS — E785 Hyperlipidemia, unspecified: Secondary | ICD-10-CM

## 2020-09-01 MED ORDER — ATORVASTATIN CALCIUM 10 MG PO TABS: 10.0000 mg | ORAL_TABLET | Freq: Every day | ORAL | 3 refills | Status: DC

## 2020-09-01 MED ORDER — ESCITALOPRAM OXALATE 10 MG PO TABS: 10.0000 mg | ORAL_TABLET | Freq: Every day | ORAL | 3 refills | Status: DC

## 2020-09-01 NOTE — Progress Notes (Signed)
This visit occurred during the SARS-CoV-2 public health emergency.  Safety protocols were in place, including screening questions prior to the visit, additional usage of staff PPE, and extensive cleaning of exam room while observing appropriate contact time as indicated for disinfecting solutions.  CPE- See plan.  Routine anticipatory guidance given to patient.  See health maintenance.  The possibility exists that previously documented standard health maintenance information may have been brought forward from a previous encounter into this note.  If needed, that same information has been updated to reflect the current situation based on today's encounter.    Tetanus done 2021 Flu done yearly, d/w pt.   PNA due at 65. shingles d/w pt.   covid vaccine prev done.   PSA screening- done by uro, I'll defer, d/w pt.   Colonoscopy done prev, 2014. Was told 10 year f/u. Done per [name]. Living will d/w pt. Wife designated if patient were incapacitated. Diet and exercise d/w pt. Encouraged both.  HCV and HIV screening prev neg. Labs d/w pt.   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  He is trying to manage as is.    He forgot to start lipitor, d/w pt.  Labs and use d/w pt.  No ADE on med.  With calcified coronary artery atherosclerosis prev noted on imaging.  D/w pt. reasonable to try Lipitor 10 mg a day with routine cautions and he will update me as needed.  See after visit summary.  H/o SZ d/o.  No ADE on med.  Compliant.  No events.  Seeing neurology.    Mood is good.  Lexapro helped.  He wanted to continue.  He failed taper.  More anxious off med.   PMH and SH reviewed  Meds, vitals, and allergies reviewed.   ROS: Per HPI.  Unless specifically indicated otherwise in HPI, the patient denies:  General: fever. Eyes: acute vision changes ENT: sore throat Cardiovascular: chest pain Respiratory: SOB GI: vomiting GU: dysuria Musculoskeletal:  acute back pain Derm: acute rash Neuro: acute motor dysfunction Psych: worsening mood Endocrine: polydipsia Heme: bleeding Allergy: hayfever  GEN: nad, alert and oriented HEENT: ncat NECK: supple w/o LA CV: rrr. PULM: ctab, no inc wob ABD: soft, +bs EXT: no edema SKIN: no acute rash

## 2020-09-01 NOTE — Patient Instructions (Addendum)
Try taking atorvastatin at night and let me know if you have aches on the med.  If tolerated, then recheck fasting labs in about 2 months.  Take care.  Glad to see you. I would get a flu shot each fall.

## 2020-09-04 NOTE — Assessment & Plan Note (Signed)
He forgot to start lipitor, d/w pt.  Labs and use d/w pt.  No ADE on med.  With calcified coronary artery atherosclerosis prev noted on imaging.  D/w pt. reasonable to try Lipitor 10 mg a day with routine cautions and he will update me as needed.  See after visit summary.

## 2020-09-04 NOTE — Assessment & Plan Note (Signed)
Living will d/w pt.  Wife designated if patient were incapacitated.   ?

## 2020-09-04 NOTE — Assessment & Plan Note (Signed)
No ADE on med.  Compliant.  No events.  Seeing neurology.  Continue as is.

## 2020-09-04 NOTE — Assessment & Plan Note (Signed)
Tetanus done 2021 Flu done yearly, d/w pt.   PNA due at 65. shingles d/w pt.   covid vaccine prev done.   PSA screening- done by uro, I'll defer, d/w pt.   Colonoscopy done prev, 2014. Was told 10 year f/u. Done per [facility]. Living will d/w pt. Wife designated if patient were incapacitated. Diet and exercise d/w pt. Encouraged both.  HCV and HIV screening prev neg. Labs d/w pt.  

## 2020-09-04 NOTE — Assessment & Plan Note (Signed)
Mood is good.  Lexapro helped.  He wanted to continue.  He failed taper.  More anxious off med.

## 2020-09-15 ENCOUNTER — Other Ambulatory Visit: Payer: Self-pay | Admitting: Neurology

## 2020-09-19 NOTE — Telephone Encounter (Signed)
Per [place] registry, Vimpat last filled on 06/20/2020 #180/90. Pending appt 12/20/20. Refill sent to MM NP to e-scribe.

## 2020-12-12 ENCOUNTER — Other Ambulatory Visit: Payer: Self-pay | Admitting: Adult Health

## 2020-12-15 ENCOUNTER — Other Ambulatory Visit: Payer: Self-pay | Admitting: Adult Health

## 2020-12-15 MED ORDER — LACOSAMIDE 150 MG PO TABS: 1.0000 | ORAL_TABLET | Freq: Two times a day (BID) | ORAL | 2 refills | Status: DC

## 2020-12-15 NOTE — Telephone Encounter (Signed)
Pt requesting refill for VIMPAT 150 MG TABS. Pharmacy [facility] [address], [place] - [address]. Pt requesting a call back once prescription has been sent to pahrmacy.

## 2020-12-20 ENCOUNTER — Telehealth: Payer: 59 | Admitting: Adult Health

## 2020-12-20 DIAGNOSIS — R569 Unspecified convulsions: Secondary | ICD-10-CM

## 2020-12-20 NOTE — Progress Notes (Signed)
PATIENT: Mark Hernandez. DOB: 1960/05/29  REASON FOR VISIT: follow up HISTORY FROM: patient  Virtual Visit via Video Note  I connected with Mark Hernandez. on 12/20/20 at  3:15 PM EST by a video enabled telemedicine application located remotely at [facility] and verified that I am speaking with the correct person using two identifiers who was located at their own home.   I discussed the limitations of evaluation and management by telemedicine and the availability of in person appointments. The patient expressed understanding and agreed to proceed.   PATIENT: Mark Hernandez. DOB: April 26, 1960  REASON FOR VISIT: follow up HISTORY FROM: patient  HISTORY OF PRESENT ILLNESS: Today 12/20/20:  Mark Hernandez is a 60 year old male with a history of seizures.  He returns today for follow-up.  He denies any seizure events.  He states that he is on the generic of Vimpat now.  He reports that he has tolerated that well.  Continues to operate a motor vehicle without difficulty.  Returns today for evaluation.  HISTORY 12/20/19: Mark Hernandez is a 60 year old male with a history of seizures.  He returns today for follow-up.  He denies any seizure events.  Continues on Vimpat 150 mg twice a day.  He is able to complete all ADLs independently.  Operates a Librarian, academic.  Returns today for follow-up.  REVIEW OF SYSTEMS: Out of a complete 14 system review of symptoms, the patient complains only of the following symptoms, and all other reviewed systems are negative.  ALLERGIES: Allergies  Allergen Reactions   Penicillins Rash    Did it involve swelling of the face/tongue/throat, SOB, or low BP? Unknown Did it involve sudden or severe rash/hives, skin peeling, or any reaction on the inside of your mouth or nose? Unknown Did you need to seek medical attention at a hospital or doctor's office? Unknown When did it last happen?  Childhood     If all above answers are "NO", may proceed with  cephalosporin use.     HOME MEDICATIONS: Outpatient Medications Prior to Visit  Medication Sig Dispense Refill   atorvastatin (LIPITOR) 10 MG tablet Take 1 tablet (10 mg total) by mouth daily. 90 tablet 3   escitalopram (LEXAPRO) 10 MG tablet Take 1 tablet (10 mg total) by mouth daily. 90 tablet 3   glucosamine-chondroitin 500-400 MG tablet Take 1 tablet by mouth daily.     Lacosamide (VIMPAT) 150 MG TABS Take 1 tablet (150 mg total) by mouth 2 (two) times daily. 60 tablet 2   Turmeric 500 MG TABS Take 500 mg by mouth.     No facility-administered medications prior to visit.    PAST MEDICAL HISTORY: Past Medical History:  Diagnosis Date   Anxiety    Arthritis    BOTH HANDS   BPH (benign prostatic hypertrophy)    Elevated PSA    History of kidney stones    HLD (hyperlipidemia)    Hx MRSA infection 2009   Nocturia    Over weight    Rotator cuff tear    Seizures (HCC)     PAST SURGICAL HISTORY: Past Surgical History:  Procedure Laterality Date   COLONOSCOPY  2014?   INGUINAL HERNIA REPAIR Left 02/17/2015   Procedure: LAPAROSCOPIC INGUINAL HERNIA;  Surgeon: Kieth Brightly, MD;  Location: [facility];  Service: General;  Laterality: Left;   KNEE ARTHROSCOPY WITH MEDIAL MENISECTOMY Right 01/13/2019   Procedure: KNEE ARTHROSCOPY WITH MEDIAL MENISECTOMY;  Surgeon: Martha Clan,  Caryn Bee, MD;  Location: [facility];  Service: Orthopedics;  Laterality: Right;   LITHOTRIPSY     x 2   SHOULDER ARTHROSCOPY WITH OPEN ROTATOR CUFF REPAIR Left 09/23/2014   Procedure: SHOULDER ARTHROSCOPY , debridement, decompression, WITH  mini OPEN ROTATOR CUFF REPAIR;  Surgeon: Christena Flake, MD;  Location: [facility];  Service: Orthopedics;  Laterality: Left;   SHOULDER SURGERY Bilateral    rotator cuff repair   UMBILICAL HERNIA REPAIR N/A 02/17/2015   Procedure: LAPAROSCOPIC UMBILICAL HERNIA;  Surgeon: Kieth Brightly, MD;  Location: [facility];  Service: General;  Laterality: N/A;    FAMILY  HISTORY: Family History  Problem Relation Age of Onset   COPD Mother    Lupus Mother    Prostate cancer Father    Colon cancer Paternal Grandmother    Kidney disease Paternal Uncle    Kidney cancer Paternal Uncle     SOCIAL HISTORY: Social History   Socioeconomic History   Marital status: Married    Spouse name: Rosalita Chessman   Number of children: 2   Years of education: 14   Highest education level: Not on file  Occupational History   Occupation: Engineer, structural   Tobacco Use   Smoking status: Never   Smokeless tobacco: Former    Types: Chew    Quit date: 05/07/2004  Vaping Use   Vaping Use: Never used  Substance and Sexual Activity   Alcohol use: Yes    Comment: occ   Drug use: No   Sexual activity: Not on file  Other Topics Concern   Not on file  Social History Narrative   Married 1997, 2 children   Ambidextrous   Associate's degree   IT sales professional, 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 Follows all commands speech and language fluent Cranial nerve II-XII:Extraocular movements were full. Facial symmetry noted. Head turning and shoulder shrug  were normal and symmetric.  DIAGNOSTIC DATA (LABS, IMAGING, TESTING) - I reviewed patient records, labs, notes, testing and imaging myself where available.  Lab Results  Component Value Date   WBC 7.1 01/09/2019   HGB 13.4 01/09/2019   HCT 40.0 01/09/2019   MCV 87.7 01/09/2019   PLT 206 01/09/2019      Component Value Date/Time   NA 138 08/25/2020 0806   K 4.4 08/25/2020  0806   CL 104 08/25/2020 0806   CO2 27 08/25/2020 0806   GLUCOSE 95 08/25/2020 0806   BUN 18 08/25/2020 0806   CREATININE 0.87 08/25/2020 0806   CALCIUM 9.4 08/25/2020 0806   PROT 7.3 08/25/2020 0806   ALBUMIN 4.0 08/25/2020 0806   AST 16 08/25/2020 0806   ALT 10 08/25/2020 0806   ALKPHOS 61 08/25/2020 0806   BILITOT 0.5 08/25/2020 0806   GFRNONAA >60 01/09/2019 0810   GFRAA >60 01/09/2019 0810   Lab Results  Component Value Date   CHOL 182 08/25/2020   HDL 44.90 08/25/2020   LDLCALC 119 (H) 08/25/2020   TRIG 90.0 08/25/2020   CHOLHDL 4 08/25/2020  ASSESSMENT AND PLAN 60 y.o. year old male  has a past medical history of Anxiety, Arthritis, BPH (benign prostatic hypertrophy), Elevated PSA, History of kidney stones, HLD (hyperlipidemia), MRSA infection (2009), Nocturia, Over weight, Rotator cuff tear, and Seizures (HCC). here with:  1.  Seizures  Continue lacosamide 150 mg twice a day Advised if he has any seizure events he should let us know Follow-up in 1 year or sooner if needed    Butch Penny, MSN, NP-C 12/20/2020, 3:19 PM [facility] [address], [address] [place], [place] [address] [phone]

## 2021-03-12 ENCOUNTER — Other Ambulatory Visit: Payer: Self-pay | Admitting: Adult Health

## 2021-03-13 NOTE — Telephone Encounter (Signed)
Last visit 11/2020, next visit scheduled 11-2021

## 2021-03-29 ENCOUNTER — Other Ambulatory Visit: Payer: Self-pay

## 2021-03-29 DIAGNOSIS — N138 Other obstructive and reflux uropathy: Secondary | ICD-10-CM

## 2021-03-29 DIAGNOSIS — N401 Enlarged prostate with lower urinary tract symptoms: Secondary | ICD-10-CM

## 2021-04-03 ENCOUNTER — Other Ambulatory Visit: Payer: Self-pay | Admitting: Orthopedic Surgery

## 2021-04-03 DIAGNOSIS — Z01818 Encounter for other preprocedural examination: Secondary | ICD-10-CM

## 2021-04-04 ENCOUNTER — Other Ambulatory Visit: Payer: 59

## 2021-04-04 ENCOUNTER — Other Ambulatory Visit: Payer: Self-pay

## 2021-04-04 DIAGNOSIS — N138 Other obstructive and reflux uropathy: Secondary | ICD-10-CM

## 2021-04-05 LAB — PSA: Prostate Specific Ag, Serum: 1 ng/mL (ref 0.0–4.0)

## 2021-04-06 NOTE — Progress Notes (Unsigned)
04/07/21 9:29 AM   Mark Connersony G Eichholz Jr. October 14, 1960 [id number]  Referring provider:  Joaquim Namuncan, Graham S, MD [address] [place],  [place] [id number]  Chief Complaint  Patient presents with   Benign Prostatic Hypertrophy    Urological history  1. BPH with LU TS - PSA 1.0 04/04/2021 - I PSS 15/1   2. Family history of prostate cancer - father and paternal father with prostate cancer   3. Nephrolithiasis - last incident in 2010   HPI: Mark Connersony G Ehrsam Jr. is a 61 y.o.male who presents today for a 1 year follow-up with IPSS.   He reports that he is doing well today he does now have any urinary complains. He reports that he is getting a new knee and he is hopeful about this.  Patient denies any modifying or aggravating factors.  Patient denies any gross hematuria, dysuria or suprapubic/flank pain.  Patient denies any fevers, chills, nausea or vomiting.     IPSS     Row Name 04/07/21 0900         International Prostate Symptom Score   How often have you had the sensation of not emptying your bladder? Less than half the time     How often have you had to urinate less than every two hours? Less than half the time     How often have you found you stopped and started again several times when you urinated? Less than half the time     How often have you found it difficult to postpone urination? About half the time     How often have you had a weak urinary stream? Less than half the time     How often have you had to strain to start urination? Less than half the time     How many times did you typically get up at night to urinate? 2 Times     Total IPSS Score 15       Quality of Life due to urinary symptoms   If you were to spend the rest of your life with your urinary condition just the way it is now how would you feel about that? Pleased              Score:  1-7 Mild 8-19 Moderate 20-35 Severe     PMH: Past Medical History:  Diagnosis Date   Anxiety    Arthritis     BOTH HANDS   BPH (benign prostatic hypertrophy)    Elevated PSA    History of kidney stones    HLD (hyperlipidemia)    Hx MRSA infection 2009   Nocturia    Over weight    Rotator cuff tear    Seizures (HCC)     Surgical History: Past Surgical History:  Procedure Laterality Date   COLONOSCOPY  2014?   INGUINAL HERNIA REPAIR Left 02/17/2015   Procedure: LAPAROSCOPIC INGUINAL HERNIA;  Surgeon: Kieth BrightlySeeplaputhur G Sankar, MD;  Location: [facility];  Service: General;  Laterality: Left;   KNEE ARTHROSCOPY WITH MEDIAL MENISECTOMY Right 01/13/2019   Procedure: KNEE ARTHROSCOPY WITH MEDIAL MENISECTOMY;  Surgeon: Juanell FairlyKrasinski, Kevin, MD;  Location: [facility];  Service: Orthopedics;  Laterality: Right;   LITHOTRIPSY     x 2   SHOULDER ARTHROSCOPY WITH OPEN ROTATOR CUFF REPAIR Left 09/23/2014   Procedure: SHOULDER ARTHROSCOPY , debridement, decompression, WITH  mini OPEN ROTATOR CUFF REPAIR;  Surgeon: Christena FlakeJohn J Poggi, MD;  Location: [facility];  Service: Orthopedics;  Laterality: Left;  SHOULDER SURGERY Bilateral    rotator cuff repair   UMBILICAL HERNIA REPAIR N/A 02/17/2015   Procedure: LAPAROSCOPIC UMBILICAL HERNIA;  Surgeon: Kieth Brightly, MD;  Location: [facility];  Service: General;  Laterality: N/A;    Home Medications:  Allergies as of 04/07/2021       Reactions   Penicillins Rash   Did it involve swelling of the face/tongue/throat, SOB, or low BP? Unknown Did it involve sudden or severe rash/hives, skin peeling, or any reaction on the inside of your mouth or nose? Unknown Did you need to seek medical attention at a hospital or doctor's office? Unknown When did it last happen?  Childhood     If all above answers are "NO", may proceed with cephalosporin use.        Medication List        Accurate as of April 07, 2021  9:29 AM. If you have any questions, ask your nurse or doctor.          atorvastatin 10 MG tablet Commonly known as: LIPITOR Take 1 tablet (10 mg total) by  mouth daily.   escitalopram 10 MG tablet Commonly known as: LEXAPRO Take 1 tablet (10 mg total) by mouth daily.   glucosamine-chondroitin 500-400 MG tablet Take 1 tablet by mouth daily.   Lacosamide 150 MG Tabs TAKE 1 TABLET BY MOUTH TWICE A DAY   meloxicam 7.5 MG tablet Commonly known as: MOBIC Take 7.5 mg by mouth 2 (two) times daily.   Turmeric 500 MG Tabs Take 500 mg by mouth.        Allergies:  Allergies  Allergen Reactions   Penicillins Rash    Did it involve swelling of the face/tongue/throat, SOB, or low BP? Unknown Did it involve sudden or severe rash/hives, skin peeling, or any reaction on the inside of your mouth or nose? Unknown Did you need to seek medical attention at a hospital or doctor's office? Unknown When did it last happen?  Childhood     If all above answers are "NO", may proceed with cephalosporin use.     Family History: Family History  Problem Relation Age of Onset   COPD Mother    Lupus Mother    Prostate cancer Father    Colon cancer Paternal Grandmother    Kidney disease Paternal Uncle    Kidney cancer Paternal Uncle     Social History:  reports that he has never smoked. He quit smokeless tobacco use about 16 years ago.  His smokeless tobacco use included chew. He reports current alcohol use. He reports that he does not use drugs.   Physical Exam: BP 121/83    Pulse 76    Ht 5\' 9"  (1.753 m)    Wt 210 lb (95.3 kg)    BMI 31.01 kg/m   Constitutional:  Alert and oriented, No acute distress. HEENT: [place] AT, moist mucus membranes.  Trachea midline Cardiovascular: No clubbing, cyanosis, or edema. Respiratory: Normal respiratory effort, no increased work of breathing. GU:  No CVA tenderness.  No bladder fullness or masses.  Patient with circumcised phallus.  Urethral meatus is patent.  No penile discharge. No penile lesions or rashes. Scrotum without lesions, cysts, rashes and/or edema.  Testicles are located scrotally bilaterally. No masses  are appreciated in the testicles. Left and right epididymis are normal.  Bilateral hydroceles.   Rectal: Patient with  normal sphincter tone. Anus and perineum without scarring or rashes. No rectal masses are appreciated. Prostate is approximately 50  grams, could only palpate the apex, no nodules are appreciated. Seminal vesicles could not be palpated.   Skin: No rashes, bruises or suspicious lesions. Neurologic: Grossly intact, no focal deficits, moving all 4 extremities. Psychiatric: Normal mood and affect.  Laboratory Data: Lab Results  Component Value Date   CREATININE 0.87 08/25/2020   Component     Latest Ref Rng & Units 08/25/2020  Sodium     135 - 145 mEq/L 138  Potassium     3.5 - 5.1 mEq/L 4.4  Chloride     96 - 112 mEq/L 104  CO2     19 - 32 mEq/L 27  Glucose     70 - 99 mg/dL 95  BUN     6 - 23 mg/dL 18  Creatinine     1.91 - 1.50 mg/dL 4.78  Total Bilirubin     0.2 - 1.2 mg/dL 0.5  Alkaline Phosphatase     39 - 117 U/L 61  AST     0 - 37 U/L 16  ALT     0 - 53 U/L 10  Total Protein     6.0 - 8.3 g/dL 7.3  Albumin     3.5 - 5.2 g/dL 4.0  Calcium     8.4 - 10.5 mg/dL 9.4  GFR     >29.56 mL/min 94.28   Component Prostate Specific Ag, Serum  Latest Ref Rng & Units 0.0 - 4.0 ng/mL  03/12/2017 1.0  03/12/2018 0.9  03/13/2019 1.0  04/01/2020 1.0  04/04/2021 1.0   Assessment & Plan:   1. BPH with LUTS - IPSS slightly improving  - Continue conservative management, avoiding bladder irritants and timed voiding's   2. Family history of prostate cancer: - Patient's father and paternal grandfather had been diagnosed with prostate cancer.   - PSA has remained stable  - Rectal exam was reassuring today  - Patient will continue to have yearly prostate exams and PSA's  Return in about 1 year (around 04/08/2022) for IPSS, PSA and exam.  [facility] [address], [address] [place], [place] [address] [phone]  I,Addie Cederberg,acting as a scribe for [facility], PA-C.,have documented all relevant documentation on the behalf of Sharla Tankard, PA-C,as directed by  [facility], PA-C while in the presence of Diahann Guajardo, PA-C.

## 2021-04-07 ENCOUNTER — Encounter: Payer: Self-pay | Admitting: Urology

## 2021-04-07 ENCOUNTER — Other Ambulatory Visit: Payer: Self-pay

## 2021-04-07 ENCOUNTER — Ambulatory Visit: Payer: 59 | Admitting: Urology

## 2021-04-07 VITALS — BP 121/83 | HR 76 | Ht 69.0 in | Wt 210.0 lb

## 2021-04-07 DIAGNOSIS — N138 Other obstructive and reflux uropathy: Secondary | ICD-10-CM | POA: Diagnosis not present

## 2021-04-07 DIAGNOSIS — Z8042 Family history of malignant neoplasm of prostate: Secondary | ICD-10-CM | POA: Diagnosis not present

## 2021-04-07 DIAGNOSIS — N401 Enlarged prostate with lower urinary tract symptoms: Secondary | ICD-10-CM | POA: Diagnosis not present

## 2021-04-11 ENCOUNTER — Telehealth: Payer: Self-pay

## 2021-04-11 DIAGNOSIS — E785 Hyperlipidemia, unspecified: Secondary | ICD-10-CM

## 2021-04-11 NOTE — Telephone Encounter (Signed)
Received pre op form from [facility] for Rt total knee surgery to be done on 05/09/21. Patient is due for labs and need to discuss form with him. LMTCB - Staff please route call to me when patient calls back.  ?

## 2021-04-13 NOTE — Telephone Encounter (Signed)
Labs scheduled for 04/18/21 at 8:20 am. Patient has not had any CP or SOB. Form placed in your inbox; please place lab orders.  ?

## 2021-04-17 NOTE — Addendum Note (Signed)
Addended by: Joaquim Nam on: 04/17/2021 12:28 AM ? ? Modules accepted: Orders ? ?

## 2021-04-17 NOTE — Telephone Encounter (Signed)
Form done. Thanks. 

## 2021-04-18 ENCOUNTER — Other Ambulatory Visit: Payer: Self-pay

## 2021-04-18 ENCOUNTER — Other Ambulatory Visit (INDEPENDENT_AMBULATORY_CARE_PROVIDER_SITE_OTHER): Payer: 59

## 2021-04-18 DIAGNOSIS — E785 Hyperlipidemia, unspecified: Secondary | ICD-10-CM | POA: Diagnosis not present

## 2021-04-18 LAB — CBC WITH DIFFERENTIAL/PLATELET
Basophils Absolute: 0 10*3/uL (ref 0.0–0.1)
Basophils Relative: 0.8 % (ref 0.0–3.0)
Eosinophils Absolute: 0.2 10*3/uL (ref 0.0–0.7)
Eosinophils Relative: 2.8 % (ref 0.0–5.0)
HCT: 41 % (ref 39.0–52.0)
Hemoglobin: 13.6 g/dL (ref 13.0–17.0)
Lymphocytes Relative: 26.2 % (ref 12.0–46.0)
Lymphs Abs: 1.4 10*3/uL (ref 0.7–4.0)
MCHC: 33.1 g/dL (ref 30.0–36.0)
MCV: 90.6 fl (ref 78.0–100.0)
Monocytes Absolute: 0.7 10*3/uL (ref 0.1–1.0)
Monocytes Relative: 12.6 % — ABNORMAL HIGH (ref 3.0–12.0)
Neutro Abs: 3.1 10*3/uL (ref 1.4–7.7)
Neutrophils Relative %: 57.6 % (ref 43.0–77.0)
Platelets: 208 10*3/uL (ref 150.0–400.0)
RBC: 4.52 Mil/uL (ref 4.22–5.81)
RDW: 13.3 % (ref 11.5–15.5)
WBC: 5.4 10*3/uL (ref 4.0–10.5)

## 2021-04-18 LAB — COMPREHENSIVE METABOLIC PANEL
ALT: 15 U/L (ref 0–53)
AST: 18 U/L (ref 0–37)
Albumin: 4.4 g/dL (ref 3.5–5.2)
Alkaline Phosphatase: 75 U/L (ref 39–117)
BUN: 23 mg/dL (ref 6–23)
CO2: 29 mEq/L (ref 19–32)
Calcium: 9.7 mg/dL (ref 8.4–10.5)
Chloride: 103 mEq/L (ref 96–112)
Creatinine, Ser: 0.88 mg/dL (ref 0.40–1.50)
GFR: 93.53 mL/min (ref >60.00)
Glucose, Bld: 90 mg/dL (ref 70–99)
Potassium: 4.8 mEq/L (ref 3.5–5.1)
Sodium: 138 mEq/L (ref 135–145)
Total Bilirubin: 0.6 mg/dL (ref 0.2–1.2)
Total Protein: 7.1 g/dL (ref 6.0–8.3)

## 2021-04-18 LAB — LIPID PANEL
Cholesterol: 155 mg/dL (ref 0–200)
HDL: 52 mg/dL (ref >39.00)
LDL Cholesterol: 85 mg/dL (ref 0–99)
NonHDL: 102.77
Total CHOL/HDL Ratio: 3
Triglycerides: 88 mg/dL (ref 0.0–149.0)
VLDL: 17.6 mg/dL (ref 0.0–40.0)

## 2021-04-18 LAB — HEPATIC FUNCTION PANEL
ALT: 15 U/L (ref 0–53)
AST: 18 U/L (ref 0–37)
Albumin: 4.4 g/dL (ref 3.5–5.2)
Alkaline Phosphatase: 75 U/L (ref 39–117)
Bilirubin, Direct: 0.1 mg/dL (ref 0.0–0.3)
Total Bilirubin: 0.6 mg/dL (ref 0.2–1.2)
Total Protein: 7.1 g/dL (ref 6.0–8.3)

## 2021-04-18 NOTE — Telephone Encounter (Signed)
Waiting on lab results and will fax form back once done ?

## 2021-04-20 NOTE — Telephone Encounter (Signed)
Labs and form have been faxed back to [facility] ?

## 2021-04-28 ENCOUNTER — Encounter
Admission: RE | Admit: 2021-04-28 | Discharge: 2021-04-28 | Disposition: A | Discharge status: Home / Self Care | Payer: 59 | Source: Ambulatory Visit | Attending: Orthopedic Surgery | Admitting: Orthopedic Surgery

## 2021-04-28 VITALS — BP 133/90 | HR 63 | Resp 16 | Ht 69.0 in | Wt 213.8 lb

## 2021-04-28 DIAGNOSIS — Z01818 Encounter for other preprocedural examination: Secondary | ICD-10-CM | POA: Insufficient documentation

## 2021-04-28 HISTORY — DX: Solitary pulmonary nodule: R91.1

## 2021-04-28 LAB — TYPE AND SCREEN
ABO/RH(D): A POS
Antibody Screen: NEGATIVE

## 2021-04-28 LAB — PROTIME-INR
INR: 0.9 (ref 0.8–1.2)
Prothrombin Time: 12.3 seconds (ref 11.4–15.2)

## 2021-04-28 LAB — URINALYSIS, ROUTINE W REFLEX MICROSCOPIC
Bacteria, UA: NONE SEEN
Bilirubin Urine: NEGATIVE
Glucose, UA: NEGATIVE mg/dL
Ketones, ur: NEGATIVE mg/dL
Leukocytes,Ua: NEGATIVE
Nitrite: NEGATIVE
Protein, ur: NEGATIVE mg/dL
Specific Gravity, Urine: 1.015 (ref 1.005–1.030)
Squamous Epithelial / HPF: NONE SEEN (ref 0–5)
pH: 6 (ref 5.0–8.0)

## 2021-04-28 LAB — SURGICAL PCR SCREEN
MRSA, PCR: NEGATIVE
Staphylococcus aureus: NEGATIVE

## 2021-04-28 LAB — APTT: aPTT: 31 seconds (ref 24–36)

## 2021-04-28 NOTE — Patient Instructions (Addendum)
Your procedure is scheduled on:05-09-21 Tuesday ?Report to the Registration Desk on the 1st floor of the Medical Mall.Then proceed to the 2nd floor Surgery Desk in the Medical Mall ?To find out your arrival time, please call [phone] between 1PM - 3PM on:05-08-21 Monday ? ?REMEMBER: ?Instructions that are not followed completely may result in serious medical risk, up to and including death; or upon the discretion of your surgeon and anesthesiologist your surgery may need to be rescheduled. ? ?Do not eat food after midnight the night before surgery.  ?No gum chewing, lozengers or hard candies. ? ?You may however, drink CLEAR liquids up to 2 hours before you are scheduled to arrive for your surgery. Do not drink anything within 2 hours of your scheduled arrival time. ? ?Clear liquids include: ?- water  ?- apple juice without pulp ?- gatorade (not RED colors) ?- black coffee or tea (Do NOT add milk or creamers to the coffee or tea) ?Do NOT drink anything that is not on this list. ? ?TAKE THESE MEDICATIONS THE MORNING OF SURGERY WITH A SIP OF WATER: ?-escitalopram (LEXAPRO)  ?-Lacosamide  ? ?One week prior to surgery:Last dose on 05-01-21 Monday ?Stop Anti-inflammatories (NSAIDS) such as meloxicam (MOBIC), Advil, Aleve, Ibuprofen, Motrin, Naproxen, Naprosyn and Aspirin based products such as Excedrin, Goodys Powder, BC Powder.You may however, continue to take Tylenol if needed for pain up until the day of surgery. ? ?Stop ANY OVER THE COUNTER supplements/vitamins 7 days prior to surgery (glucosamine-chondroitin and Turmeric ) ? ?No Alcohol for 24 hours before or after surgery. ? ?No Smoking including e-cigarettes for 24 hours prior to surgery.  ?No chewable tobacco products for at least 6 hours prior to surgery.  ?No nicotine patches on the day of surgery. ? ?Do not use any "recreational" drugs for at least a week prior to your surgery.  ?Please be advised that the combination of cocaine and anesthesia may have  negative outcomes, up to and including death. ?If you test positive for cocaine, your surgery will be cancelled. ? ?On the morning of surgery brush your teeth with toothpaste and water, you may rinse your mouth with mouthwash if you wish. ?Do not swallow any toothpaste or mouthwash. ? ?Use CHG Soap as directed on instruction sheet. ? ?Do not wear jewelry, make-up, hairpins, clips or nail polish. ? ?Do not wear lotions, powders, or perfumes.  ? ?Do not shave body from the neck down 48 hours prior to surgery just in case you cut yourself which could leave a site for infection.  ?Also, freshly shaved skin may become irritated if using the CHG soap. ? ?Contact lenses, hearing aids and dentures may not be worn into surgery. ? ?Do not bring valuables to the hospital. [facility] is not responsible for any missing/lost belongings or valuables.  ? ?Notify your doctor if there is any change in your medical condition (cold, fever, infection). ? ?Wear comfortable clothing (specific to your surgery type) to the hospital. ? ?After surgery, you can help prevent lung complications by doing breathing exercises.  ?Take deep breaths and cough every 1-2 hours. Your doctor may order a device called an Incentive Spirometer to help you take deep breaths. ?When coughing or sneezing, hold a pillow firmly against your incision with both hands. This is called ?splinting.? Doing this helps protect your incision. It also decreases belly discomfort. ? ?If you are being admitted to the hospital overnight, leave your suitcase in the car. ?After surgery it may be brought to  your room. ? ?If you are being discharged the day of surgery, you will not be allowed to drive home. ?You will need a responsible adult (18 years or older) to drive you home and stay with you that night.  ? ?If you are taking public transportation, you will need to have a responsible adult (18 years or older) with you. ?Please confirm with your physician that it is acceptable  to use public transportation.  ? ?Please call the [place] Dept. at [phone] if you have any questions about these instructions. ? ?Surgery Visitation Policy: ? ?Patients undergoing a surgery or procedure may have two family members or support persons with them as long as the person is not COVID-19 positive or experiencing its symptoms.  ? ?Inpatient Visitation:   ? ?Visiting hours are 7 a.m. to 8 p.m. ?Up to four visitors are allowed at one time in a patient room, including children. The visitors may rotate out with other people during the day. One designated support person (adult) may remain overnight.  ?

## 2021-05-05 ENCOUNTER — Other Ambulatory Visit: Admission: RE | Admit: 2021-05-05 | Payer: 59 | Source: Ambulatory Visit

## 2021-05-08 MED ORDER — CLINDAMYCIN PHOSPHATE 600 MG/50ML IV SOLN
600.0000 mg | Freq: Once | INTRAVENOUS | Status: AC
  Administered 2021-05-09: 600 mg via INTRAVENOUS

## 2021-05-08 MED ORDER — CHLORHEXIDINE GLUCONATE 0.12 % MT SOLN: 15.0000 mL | Freq: Once | OROMUCOSAL | Status: AC

## 2021-05-08 MED ORDER — LACTATED RINGERS IV SOLN: INTRAVENOUS | Status: DC

## 2021-05-08 MED ORDER — FAMOTIDINE 20 MG PO TABS
20.0000 mg | ORAL_TABLET | Freq: Once | ORAL | Status: AC
Start: 2021-05-08 — End: 2021-05-09

## 2021-05-08 MED ORDER — TRANEXAMIC ACID-NACL 1000-0.7 MG/100ML-% IV SOLN
1000.0000 mg | INTRAVENOUS | Status: AC
  Administered 2021-05-09: 1000 mg via INTRAVENOUS

## 2021-05-08 MED ORDER — ORAL CARE MOUTH RINSE: 15.0000 mL | Freq: Once | OROMUCOSAL | Status: AC

## 2021-05-09 ENCOUNTER — Other Ambulatory Visit: Payer: Self-pay

## 2021-05-09 ENCOUNTER — Encounter: Payer: Self-pay | Admitting: Orthopedic Surgery

## 2021-05-09 ENCOUNTER — Ambulatory Visit: Payer: 59

## 2021-05-09 ENCOUNTER — Encounter
Admission: RE | Disposition: A | Discharge status: Home Health | Payer: Self-pay | Source: Home / Self Care | Attending: Orthopedic Surgery

## 2021-05-09 ENCOUNTER — Ambulatory Visit: Payer: 59 | Admitting: Certified Registered"

## 2021-05-09 ENCOUNTER — Ambulatory Visit: Payer: 59 | Admitting: Urgent Care

## 2021-05-09 ENCOUNTER — Observation Stay
Admission: RE | Admit: 2021-05-09 | Discharge: 2021-05-10 | Disposition: A | Discharge status: Home Health | Payer: 59 | Attending: Orthopedic Surgery | Admitting: Orthopedic Surgery

## 2021-05-09 DIAGNOSIS — Z96651 Presence of right artificial knee joint: Secondary | ICD-10-CM

## 2021-05-09 DIAGNOSIS — M1711 Unilateral primary osteoarthritis, right knee: Principal | ICD-10-CM | POA: Insufficient documentation

## 2021-05-09 DIAGNOSIS — Z87891 Personal history of nicotine dependence: Secondary | ICD-10-CM | POA: Diagnosis not present

## 2021-05-09 DIAGNOSIS — M25561 Pain in right knee: Secondary | ICD-10-CM

## 2021-05-09 HISTORY — PX: TOTAL KNEE ARTHROPLASTY: SHX125

## 2021-05-09 LAB — ABO/RH: ABO/RH(D): A POS

## 2021-05-09 SURGERY — ARTHROPLASTY, KNEE, TOTAL
Anesthesia: Spinal | Site: Knee | Laterality: Right

## 2021-05-09 MED ORDER — PROPOFOL 10 MG/ML IV BOLUS
INTRAVENOUS | Status: AC
  Filled 2021-05-09: qty 20

## 2021-05-09 MED ORDER — NEOMYCIN-POLYMYXIN B GU 40-200000 IR SOLN
Status: AC
  Filled 2021-05-09: qty 20

## 2021-05-09 MED ORDER — ONDANSETRON HCL 4 MG/2ML IJ SOLN: 4.0000 mg | Freq: Once | INTRAMUSCULAR | Status: DC | PRN

## 2021-05-09 MED ORDER — PHENYLEPHRINE 40 MCG/ML (10ML) SYRINGE FOR IV PUSH (FOR BLOOD PRESSURE SUPPORT)
PREFILLED_SYRINGE | INTRAVENOUS | Status: DC | PRN
  Administered 2021-05-09: 160 ug via INTRAVENOUS

## 2021-05-09 MED ORDER — 0.9 % SODIUM CHLORIDE (POUR BTL) OPTIME
TOPICAL | Status: DC | PRN
  Administered 2021-05-09: 1000 mL

## 2021-05-09 MED ORDER — CEFOTETAN DISODIUM 2 G IJ SOLR
INTRAMUSCULAR | Status: AC
  Filled 2021-05-09: qty 2

## 2021-05-09 MED ORDER — MORPHINE SULFATE 4 MG/ML IJ SOLN
INTRAMUSCULAR | Status: DC | PRN
Start: 2021-05-09 — End: 2021-05-09
  Administered 2021-05-09: 4 mg via INTRAMUSCULAR

## 2021-05-09 MED ORDER — CHLORHEXIDINE GLUCONATE CLOTH 2 % EX PADS
6.0000 | MEDICATED_PAD | Freq: Once | CUTANEOUS | Status: DC
Start: 2021-05-09 — End: 2021-05-09

## 2021-05-09 MED ORDER — CEFAZOLIN SODIUM-DEXTROSE 2-4 GM/100ML-% IV SOLN
INTRAVENOUS | Status: AC
  Administered 2021-05-09: 2 g via INTRAVENOUS
  Filled 2021-05-09: qty 100

## 2021-05-09 MED ORDER — HYDROMORPHONE HCL 1 MG/ML IJ SOLN
0.5000 mg | INTRAMUSCULAR | Status: DC | PRN
  Administered 2021-05-09: 1 mg via INTRAVENOUS
  Filled 2021-05-09: qty 1

## 2021-05-09 MED ORDER — DEXAMETHASONE SODIUM PHOSPHATE 10 MG/ML IJ SOLN
INTRAMUSCULAR | Status: DC | PRN
  Administered 2021-05-09: 10 mg via INTRAVENOUS

## 2021-05-09 MED ORDER — MIDAZOLAM HCL 2 MG/2ML IJ SOLN
INTRAMUSCULAR | Status: AC
  Filled 2021-05-09: qty 2

## 2021-05-09 MED ORDER — BUPIVACAINE-EPINEPHRINE (PF) 0.25% -1:200000 IJ SOLN
INTRAMUSCULAR | Status: AC
  Filled 2021-05-09: qty 30

## 2021-05-09 MED ORDER — ONDANSETRON HCL 4 MG/2ML IJ SOLN
INTRAMUSCULAR | Status: AC
  Filled 2021-05-09: qty 2

## 2021-05-09 MED ORDER — METHOCARBAMOL 500 MG PO TABS
500.0000 mg | ORAL_TABLET | Freq: Four times a day (QID) | ORAL | Status: DC | PRN
  Administered 2021-05-10: 500 mg via ORAL
  Filled 2021-05-09: qty 1

## 2021-05-09 MED ORDER — CLINDAMYCIN PHOSPHATE 600 MG/50ML IV SOLN
INTRAVENOUS | Status: AC
Start: 2021-05-09 — End: 2021-05-09
  Filled 2021-05-09: qty 50

## 2021-05-09 MED ORDER — ENOXAPARIN SODIUM 40 MG/0.4ML IJ SOSY
40.0000 mg | PREFILLED_SYRINGE | INTRAMUSCULAR | Status: DC
  Administered 2021-05-10: 40 mg via SUBCUTANEOUS
  Filled 2021-05-09: qty 0.4

## 2021-05-09 MED ORDER — TRAMADOL HCL 50 MG PO TABS
50.0000 mg | ORAL_TABLET | Freq: Four times a day (QID) | ORAL | Status: DC
  Administered 2021-05-09 – 2021-05-10 (×3): 50 mg via ORAL
  Filled 2021-05-09 (×3): qty 1

## 2021-05-09 MED ORDER — GABAPENTIN 300 MG PO CAPS
ORAL_CAPSULE | ORAL | Status: AC
  Filled 2021-05-09: qty 1

## 2021-05-09 MED ORDER — SODIUM CHLORIDE (PF) 0.9 % IJ SOLN
INTRAMUSCULAR | Status: AC
  Filled 2021-05-09: qty 50

## 2021-05-09 MED ORDER — ONDANSETRON HCL 4 MG/2ML IJ SOLN: 4.0000 mg | Freq: Four times a day (QID) | INTRAMUSCULAR | Status: DC | PRN

## 2021-05-09 MED ORDER — DEXAMETHASONE SODIUM PHOSPHATE 10 MG/ML IJ SOLN
INTRAMUSCULAR | Status: AC
  Filled 2021-05-09: qty 1

## 2021-05-09 MED ORDER — METHOCARBAMOL 1000 MG/10ML IJ SOLN
500.0000 mg | Freq: Four times a day (QID) | INTRAVENOUS | Status: DC | PRN
  Filled 2021-05-09: qty 5

## 2021-05-09 MED ORDER — NEOMYCIN-POLYMYXIN B GU 40-200000 IR SOLN
Status: DC | PRN
  Administered 2021-05-09: 4 mL

## 2021-05-09 MED ORDER — ACETAMINOPHEN 325 MG PO TABS: 325.0000 mg | ORAL_TABLET | Freq: Four times a day (QID) | ORAL | Status: DC | PRN

## 2021-05-09 MED ORDER — PROPOFOL 10 MG/ML IV BOLUS
INTRAVENOUS | Status: DC | PRN
  Administered 2021-05-09: 100 ug/kg/min via INTRAVENOUS

## 2021-05-09 MED ORDER — LACOSAMIDE 50 MG PO TABS
150.0000 mg | ORAL_TABLET | Freq: Two times a day (BID) | ORAL | Status: DC
  Administered 2021-05-09 – 2021-05-10 (×2): 150 mg via ORAL
  Filled 2021-05-09 (×3): qty 3

## 2021-05-09 MED ORDER — MORPHINE SULFATE (PF) 4 MG/ML IV SOLN
INTRAVENOUS | Status: AC
  Filled 2021-05-09: qty 1

## 2021-05-09 MED ORDER — TRANEXAMIC ACID-NACL 1000-0.7 MG/100ML-% IV SOLN
INTRAVENOUS | Status: AC
  Filled 2021-05-09: qty 100

## 2021-05-09 MED ORDER — FLEET ENEMA 7-19 GM/118ML RE ENEM: 1.0000 | ENEMA | Freq: Once | RECTAL | Status: DC | PRN

## 2021-05-09 MED ORDER — FENTANYL CITRATE (PF) 100 MCG/2ML IJ SOLN: 25.0000 ug | INTRAMUSCULAR | Status: DC | PRN

## 2021-05-09 MED ORDER — FAMOTIDINE 20 MG PO TABS
ORAL_TABLET | ORAL | Status: AC
  Administered 2021-05-09: 20 mg via ORAL
  Filled 2021-05-09: qty 1

## 2021-05-09 MED ORDER — KETOROLAC TROMETHAMINE 30 MG/ML IJ SOLN
INTRAMUSCULAR | Status: AC
  Filled 2021-05-09: qty 1

## 2021-05-09 MED ORDER — CEFAZOLIN SODIUM-DEXTROSE 2-4 GM/100ML-% IV SOLN
2.0000 g | INTRAVENOUS | Status: AC
  Administered 2021-05-09: 2 g via INTRAVENOUS

## 2021-05-09 MED ORDER — ONDANSETRON HCL 4 MG/2ML IJ SOLN
INTRAMUSCULAR | Status: DC | PRN
  Administered 2021-05-09: 4 mg via INTRAVENOUS

## 2021-05-09 MED ORDER — PHENYLEPHRINE HCL (PRESSORS) 10 MG/ML IV SOLN
INTRAVENOUS | Status: AC
  Filled 2021-05-09: qty 1

## 2021-05-09 MED ORDER — PHENYLEPHRINE HCL-NACL 20-0.9 MG/250ML-% IV SOLN
INTRAVENOUS | Status: DC | PRN
  Administered 2021-05-09: 20 ug/min via INTRAVENOUS

## 2021-05-09 MED ORDER — DIPHENHYDRAMINE HCL 12.5 MG/5ML PO ELIX: 12.5000 mg | ORAL_SOLUTION | ORAL | Status: DC | PRN

## 2021-05-09 MED ORDER — CEFAZOLIN SODIUM-DEXTROSE 2-4 GM/100ML-% IV SOLN
2.0000 g | Freq: Four times a day (QID) | INTRAVENOUS | Status: AC
  Administered 2021-05-09: 2 g via INTRAVENOUS
  Filled 2021-05-09: qty 100

## 2021-05-09 MED ORDER — DOCUSATE SODIUM 100 MG PO CAPS
100.0000 mg | ORAL_CAPSULE | Freq: Two times a day (BID) | ORAL | Status: DC
  Administered 2021-05-10: 100 mg via ORAL
  Filled 2021-05-09 (×2): qty 1

## 2021-05-09 MED ORDER — SODIUM CHLORIDE 0.9 % IV SOLN: INTRAVENOUS | Status: DC

## 2021-05-09 MED ORDER — ACETAMINOPHEN 500 MG PO TABS
1000.0000 mg | ORAL_TABLET | Freq: Four times a day (QID) | ORAL | Status: DC
  Administered 2021-05-09 – 2021-05-10 (×3): 1000 mg via ORAL
  Filled 2021-05-09 (×3): qty 2

## 2021-05-09 MED ORDER — ACETAMINOPHEN 500 MG PO TABS
ORAL_TABLET | ORAL | Status: AC
Start: 2021-05-09 — End: 2021-05-09
  Administered 2021-05-09: 1000 mg via ORAL
  Filled 2021-05-09: qty 2

## 2021-05-09 MED ORDER — BUPIVACAINE LIPOSOME 1.3 % IJ SUSP
INTRAMUSCULAR | Status: AC
  Filled 2021-05-09: qty 20

## 2021-05-09 MED ORDER — FAMOTIDINE 20 MG PO TABS
ORAL_TABLET | ORAL | Status: AC
  Filled 2021-05-09: qty 1

## 2021-05-09 MED ORDER — CHLORHEXIDINE GLUCONATE 0.12 % MT SOLN
OROMUCOSAL | Status: AC
  Administered 2021-05-09: 15 mL via OROMUCOSAL
  Filled 2021-05-09: qty 15

## 2021-05-09 MED ORDER — SENNOSIDES-DOCUSATE SODIUM 8.6-50 MG PO TABS: 1.0000 | ORAL_TABLET | Freq: Every evening | ORAL | Status: DC | PRN

## 2021-05-09 MED ORDER — BUPIVACAINE-EPINEPHRINE 0.25% -1:200000 IJ SOLN
INTRAMUSCULAR | Status: DC | PRN
  Administered 2021-05-09: 60 mL

## 2021-05-09 MED ORDER — ONDANSETRON HCL 4 MG PO TABS: 4.0000 mg | ORAL_TABLET | Freq: Four times a day (QID) | ORAL | Status: DC | PRN

## 2021-05-09 MED ORDER — MIDAZOLAM HCL 5 MG/5ML IJ SOLN
INTRAMUSCULAR | Status: DC | PRN
  Administered 2021-05-09: 2 mg via INTRAVENOUS

## 2021-05-09 MED ORDER — HYDROMORPHONE HCL 2 MG PO TABS: 2.0000 mg | ORAL_TABLET | ORAL | Status: DC | PRN

## 2021-05-09 MED ORDER — TRANEXAMIC ACID 1000 MG/10ML IV SOLN
INTRAVENOUS | Status: AC
  Filled 2021-05-09: qty 10

## 2021-05-09 MED ORDER — SODIUM CHLORIDE 0.9 % IR SOLN
Status: DC | PRN
  Administered 2021-05-09: 3000 mL

## 2021-05-09 MED ORDER — ESCITALOPRAM OXALATE 10 MG PO TABS
10.0000 mg | ORAL_TABLET | ORAL | Status: DC
  Administered 2021-05-10: 10 mg via ORAL
  Filled 2021-05-09: qty 1

## 2021-05-09 MED ORDER — KETOROLAC TROMETHAMINE 30 MG/ML IJ SOLN
INTRAMUSCULAR | Status: DC | PRN
Start: 2021-05-09 — End: 2021-05-09
  Administered 2021-05-09: 30 mg via INTRAMUSCULAR

## 2021-05-09 MED ORDER — CHLORHEXIDINE GLUCONATE CLOTH 2 % EX PADS: 6.0000 | MEDICATED_PAD | Freq: Once | CUTANEOUS | Status: DC

## 2021-05-09 MED ORDER — BUPIVACAINE HCL (PF) 0.5 % IJ SOLN
INTRAMUSCULAR | Status: DC | PRN
  Administered 2021-05-09: 3 mL via INTRATHECAL

## 2021-05-09 MED ORDER — PROPOFOL 1000 MG/100ML IV EMUL
INTRAVENOUS | Status: AC
  Filled 2021-05-09: qty 100

## 2021-05-09 MED ORDER — HYDROMORPHONE HCL 2 MG PO TABS: 1.0000 mg | ORAL_TABLET | ORAL | Status: DC | PRN

## 2021-05-09 MED ORDER — ATORVASTATIN CALCIUM 10 MG PO TABS
10.0000 mg | ORAL_TABLET | Freq: Every day | ORAL | Status: DC
  Administered 2021-05-10: 10 mg via ORAL
  Filled 2021-05-09: qty 1

## 2021-05-09 MED ORDER — OXYCODONE HCL 5 MG PO TABS
5.0000 mg | ORAL_TABLET | ORAL | Status: DC | PRN
  Administered 2021-05-10: 10 mg via ORAL
  Filled 2021-05-09: qty 2

## 2021-05-09 MED ORDER — SODIUM CHLORIDE 0.9 % IV SOLN
INTRAVENOUS | Status: DC | PRN
  Administered 2021-05-09: 60 mL

## 2021-05-09 MED ORDER — EPHEDRINE SULFATE (PRESSORS) 50 MG/ML IJ SOLN
INTRAMUSCULAR | Status: DC | PRN
  Administered 2021-05-09 (×2): 5 mg via INTRAVENOUS

## 2021-05-09 MED ORDER — OXYCODONE HCL 5 MG PO TABS: 10.0000 mg | ORAL_TABLET | ORAL | Status: DC | PRN

## 2021-05-09 MED ORDER — ACETAMINOPHEN 500 MG PO TABS
ORAL_TABLET | ORAL | Status: AC
Start: 2021-05-09 — End: 2021-05-09
  Filled 2021-05-09: qty 2

## 2021-05-09 MED ORDER — BISACODYL 5 MG PO TBEC: 10.0000 mg | DELAYED_RELEASE_TABLET | Freq: Every day | ORAL | Status: DC | PRN

## 2021-05-09 MED ORDER — CHLORHEXIDINE GLUCONATE 0.12 % MT SOLN
OROMUCOSAL | Status: AC
  Filled 2021-05-09: qty 15

## 2021-05-09 MED ORDER — CEFAZOLIN SODIUM-DEXTROSE 2-4 GM/100ML-% IV SOLN
INTRAVENOUS | Status: AC
  Filled 2021-05-09: qty 100

## 2021-05-09 MED ORDER — ACETAMINOPHEN 500 MG PO TABS: 1000.0000 mg | ORAL_TABLET | ORAL | Status: AC

## 2021-05-09 SURGICAL SUPPLY — 80 items
BLADE SAW 90X13X1.19 OSCILLAT (BLADE) ×2 IMPLANT
BLADE SAW 90X25X1.19 OSCILLAT (BLADE) ×2 IMPLANT
CEMENT HV SMART SET (Cement) ×4 IMPLANT
CEMENT TIBIA MBT SIZE 4 (Knees) IMPLANT
CNTNR SPEC 2.5X3XGRAD LEK (MISCELLANEOUS) ×1
CONT SPEC 4OZ STER OR WHT (MISCELLANEOUS) ×1
CONT SPEC 4OZ STRL OR WHT (MISCELLANEOUS) ×1
CONTAINER SPEC 2.5X3XGRAD LEK (MISCELLANEOUS) ×1 IMPLANT
COOLER POLAR GLACIER W/PUMP (MISCELLANEOUS) ×2 IMPLANT
CUFF TOURN SGL QUICK 24 (TOURNIQUET CUFF)
CUFF TOURN SGL QUICK 34 (TOURNIQUET CUFF)
CUFF TRNQT CYL 24X4X16.5-23 (TOURNIQUET CUFF) IMPLANT
CUFF TRNQT CYL 34X4.125X (TOURNIQUET CUFF) IMPLANT
DRAPE 3/4 80X56 (DRAPES) ×4 IMPLANT
DRAPE IMP U-DRAPE 54X76 (DRAPES) ×4 IMPLANT
DRAPE INCISE IOBAN 66X60 STRL (DRAPES) ×2 IMPLANT
DRAPE SURG 17X11 SM STRL (DRAPES) ×4 IMPLANT
DRSG AQUACEL AG ADV 3.5X10 (GAUZE/BANDAGES/DRESSINGS) ×2 IMPLANT
DRSG MEPILEX SACRM 8.7X9.8 (GAUZE/BANDAGES/DRESSINGS) ×2 IMPLANT
DURAPREP 26ML APPLICATOR (WOUND CARE) ×8 IMPLANT
ELECT REM PT RETURN 9FT ADLT (ELECTROSURGICAL) ×2
ELECTRODE REM PT RTRN 9FT ADLT (ELECTROSURGICAL) ×1 IMPLANT
FEMUR SIGMA PS SZ 4.0 R (Femur) ×1 IMPLANT
GAUZE SPONGE 4X4 12PLY STRL (GAUZE/BANDAGES/DRESSINGS) ×2 IMPLANT
GAUZE SPONGE 4X4 16PLY XRAY LF (GAUZE/BANDAGES/DRESSINGS) ×1 IMPLANT
GLOVE SURG ORTHO LTX SZ9 (GLOVE) ×8 IMPLANT
GLOVE SURG SYN 7.5  E (GLOVE) ×2
GLOVE SURG SYN 7.5 E (GLOVE) ×1 IMPLANT
GLOVE SURG SYN 7.5 PF PI (GLOVE) ×1 IMPLANT
GLOVE SURG UNDER POLY LF SZ7.5 (GLOVE) ×2 IMPLANT
GLOVE SURG UNDER POLY LF SZ9 (GLOVE) ×4 IMPLANT
GOWN STRL REUS TWL 2XL XL LVL4 (GOWN DISPOSABLE) ×2 IMPLANT
GOWN STRL REUS W/ TWL LRG LVL3 (GOWN DISPOSABLE) ×1 IMPLANT
GOWN STRL REUS W/ TWL LRG LVL4 (GOWN DISPOSABLE) ×1 IMPLANT
GOWN STRL REUS W/ TWL XL LVL3 (GOWN DISPOSABLE) ×1 IMPLANT
GOWN STRL REUS W/TWL 2XL LVL3 (GOWN DISPOSABLE) ×2 IMPLANT
GOWN STRL REUS W/TWL LRG LVL3 (GOWN DISPOSABLE) ×2
GOWN STRL REUS W/TWL LRG LVL4 (GOWN DISPOSABLE) ×2
GOWN STRL REUS W/TWL XL LVL3 (GOWN DISPOSABLE) ×2
HOLDER FOLEY CATH W/STRAP (MISCELLANEOUS) ×2 IMPLANT
IMMBOLIZER KNEE 19 BLUE UNIV (SOFTGOODS) ×2 IMPLANT
IMMOB KNEE 24 THIGH 24 443303 (SOFTGOODS) ×1 IMPLANT
IV NS IRRIG 3000ML ARTHROMATIC (IV SOLUTION) ×2 IMPLANT
KIT TURNOVER KIT A (KITS) ×2 IMPLANT
MANIFOLD NEPTUNE II (INSTRUMENTS) ×4 IMPLANT
NDL SAFETY ECLIPSE 18X1.5 (NEEDLE) ×1 IMPLANT
NDL SPNL 20GX3.5 QUINCKE YW (NEEDLE) ×1 IMPLANT
NEEDLE HYPO 18GX1.5 SHARP (NEEDLE) ×2
NEEDLE HYPO 22GX1.5 SAFETY (NEEDLE) ×2 IMPLANT
NEEDLE SPNL 20GX3.5 QUINCKE YW (NEEDLE) ×2 IMPLANT
NS IRRIG 1000ML POUR BTL (IV SOLUTION) ×2 IMPLANT
PACK TOTAL KNEE (MISCELLANEOUS) ×2 IMPLANT
PAD ABD DERMACEA PRESS 5X9 (GAUZE/BANDAGES/DRESSINGS) ×3 IMPLANT
PAD CAST 4YDX4 CTTN HI CHSV (CAST SUPPLIES) IMPLANT
PAD WRAPON POLAR KNEE (MISCELLANEOUS) ×1 IMPLANT
PADDING CAST COTTON 4X4 STRL (CAST SUPPLIES) ×2
PATELLA DOME PFC 38MM (Knees) ×1 IMPLANT
PENCIL SMOKE EVACUATOR COATED (MISCELLANEOUS) ×2 IMPLANT
PIN DRILL FIX HALF THREAD (BIT) ×4 IMPLANT
PIN DRILL QUICK PACK ×2 IMPLANT
PIN FIXATION 1/8DIA X 3INL (PIN) ×5 IMPLANT
PLATE ROT INSERT 10MM SIZE 4 (Plate) ×1 IMPLANT
PULSAVAC PLUS IRRIG FAN TIP (DISPOSABLE) ×2
STAPLER SKIN PROX 35W (STAPLE) ×2 IMPLANT
STOCKINETTE BIAS CUT 6 980064 (GAUZE/BANDAGES/DRESSINGS) ×1 IMPLANT
SUCTION FRAZIER HANDLE 10FR (MISCELLANEOUS) ×2
SUCTION TUBE FRAZIER 10FR DISP (MISCELLANEOUS) ×1 IMPLANT
SUT ETHIBOND NAB CT1 #1 30IN (SUTURE) ×4 IMPLANT
SUT VIC AB 0 CT1 36 (SUTURE) ×2 IMPLANT
SUT VIC AB 2-0 CT1 (SUTURE) ×4 IMPLANT
SYR 20ML LL LF (SYRINGE) ×2 IMPLANT
SYR 30ML LL (SYRINGE) ×4 IMPLANT
TAPE PAPER 3X10 WHT MICROPORE (GAUZE/BANDAGES/DRESSINGS) ×1 IMPLANT
TIBIA MBT CEMENT SIZE 4 (Knees) ×2 IMPLANT
TIP FAN IRRIG PULSAVAC PLUS (DISPOSABLE) ×1 IMPLANT
TOWER CARTRIDGE SMART MIX (DISPOSABLE) ×2 IMPLANT
TRAY FOLEY MTR SLVR 16FR STAT (SET/KITS/TRAYS/PACK) ×2 IMPLANT
TUBE SUCT KAM VAC (TUBING) ×2 IMPLANT
WATER STERILE IRR 500ML POUR (IV SOLUTION) ×2 IMPLANT
WRAPON POLAR PAD KNEE (MISCELLANEOUS) ×2

## 2021-05-09 NOTE — Transfer of Care (Signed)
Immediate Anesthesia Transfer of Care Note ? ?Patient: Mark Hernandez. ? ?Procedure(s) Performed: TOTAL KNEE ARTHROPLASTY (Right: Knee) ? ?Patient Location: PACU ? ?Anesthesia Type:Spinal ? ?Level of Consciousness: awake ? ?Airway & Oxygen Therapy: Patient Spontanous Breathing ? ?Post-op Assessment: Report given to RN and Post -op Vital signs reviewed and stable ? ?Post vital signs: Reviewed and stable ? ?Last Vitals:  ?Vitals Value Taken Time  ?BP 101/61 05/09/21 1045  ?Temp 35.9   ?Pulse 82 05/09/21 1048  ?Resp 17 05/09/21 1050  ?SpO2 93 % 05/09/21 1048  ?Vitals shown include unvalidated device data. ? ?Last Pain:  ?Vitals:  ? 05/09/21 0656  ?TempSrc: Temporal  ?PainSc: 3   ?   ? ?  ? ?Complications: No notable events documented. ?

## 2021-05-09 NOTE — Anesthesia Procedure Notes (Signed)
Spinal ? ?Patient location during procedure: OR ?Start time: 05/09/2021 8:00 AM ?End time: 05/09/2021 8:07 AM ?Reason for block: surgical anesthesia ?Staffing ?Resident/CRNA: Cammie Sickle, CRNA ?Preanesthetic Checklist ?Completed: patient identified, IV checked, site marked, risks and benefits discussed, surgical consent, monitors and equipment checked, pre-op evaluation and timeout performed ?Spinal Block ?Patient position: sitting ?Prep: Betadine ?Patient monitoring: heart rate, continuous pulse ox, blood pressure and cardiac monitor ?Approach: midline ?Location: L4-5 ?Injection technique: single-shot ?Needle ?Needle type: Whitacre and Introducer  ?Needle gauge: 24 G ?Needle length: 9 cm ?Assessment ?Sensory level: T6 ?Events: CSF return ?Additional Notes ?Negative paresthesia. Negative blood return. Positive free-flowing CSF. Expiration date of kit checked and confirmed. Patient tolerated procedure well, without complications. ? ? ? ? ? ?

## 2021-05-09 NOTE — Evaluation (Addendum)
Physical Therapy Evaluation ?Patient Details ?Name: Mark Hernandez. ?MRN: [id number] ?DOB: [birth year] ?Today's Date: 05/09/2021 ? ?History of Present Illness ? Patient is a 61 year old male s/p TKA (R).  ?Clinical Impression ? Physical Therapy Evaluation completed this date. Patient tolerated session fairly and was agreeable to treatment. Upon arrival into room patient was supine in bed resting, with no pain reported. Patient lives in a 2 level home with 5 STE and bilateral hand rails with his wife and son. Patient will be staying on the main level of the house post d/c from hospital, and has PRN assistance from both his wife and son. Patient reports being Independent with all ADLs and mobility prior to hospitalization.  ? ?Patient demonstrated mild decreased sensation to light tough on the RLE upon arrival into room, patient was however able to perform a SLR on BLEs, demonstrating at least 3/5 strength. Knee flexion: 73 degrees. Patient was able to perform all bed mobility at supervision with no physical assistance required. Functional transfers from EOB was completed at [facility] for safety due to patient's first being out of bed, and he could ambulate 1 lap around the nurses station with RW at [facility]. No knee buckling or LOB was noted during ambulation. HEP was left in room however not reviewed. Patient was left in bed with all needs met. Patient would continue to benefit from skilled physical therapy in order to optimize patient's return to PLOF. Recommend HHPT upon discharge from acute hospitalization.  ?   ? ?Recommendations for follow up therapy are one component of a multi-disciplinary discharge planning process, led by the attending physician.  Recommendations may be updated based on patient status, additional functional criteria and insurance authorization. ? ?Follow Up Recommendations Home health PT ? ?  ?Assistance Recommended at Discharge Intermittent Supervision/Assistance  ?Patient can return home with the  following ? A little help with walking and/or transfers;Help with stairs or ramp for entrance;A little help with bathing/dressing/bathroom ? ?  ?Equipment Recommendations Rolling walker (2 wheels)  ?Recommendations for Other Services ?    ?  ?Functional Status Assessment Patient has had a recent decline in their functional status and demonstrates the ability to make significant improvements in function in a reasonable and predictable amount of time.  ? ?  ?Precautions / Restrictions Precautions ?Precautions: Knee;Fall ?Restrictions ?Weight Bearing Restrictions: Yes ?RLE Weight Bearing: Weight bearing as tolerated  ? ?  ? ?Mobility ? Bed Mobility ?Overal bed mobility: Needs Assistance ?Bed Mobility: Supine to Sit, Sit to Supine ?  ?  ?Supine to sit: Supervision ?Sit to supine: Supervision ?  ?General bed mobility comments: minimal use of handrails ?  ? ?Transfers ?Overall transfer level: Needs assistance ?Equipment used: Rolling walker (2 wheels) ?Transfers: Sit to/from Stand ?Sit to Stand: Min guard ?  ?  ?  ?  ?  ?General transfer comment: [facility] for safety for patient's first time OOB post surgery ?  ? ?Ambulation/Gait ?Ambulation/Gait assistance: Min guard ?Gait Distance (Feet): 245 Feet ?Assistive device: Rolling walker (2 wheels) ?Gait Pattern/deviations: Step-through pattern, Decreased step length - right, Decreased step length - left, Decreased stride length, Narrow base of support ?Gait velocity: decreased ?  ?  ?General Gait Details: no LOB noted ? ?Stairs ?  ?  ?  ?  ?  ? ?Wheelchair Mobility ?  ? ?Modified Rankin (Stroke Patients Only) ?  ? ?  ? ?Balance Overall balance assessment: Needs assistance ?Sitting-balance support: Bilateral upper extremity supported, Feet supported ?Sitting balance-Leahy Scale:  Good ?  ?  ?Standing balance support: Bilateral upper extremity supported, During functional activity, Reliant on assistive device for balance ?Standing balance-Leahy Scale: Fair ?  ?  ?  ?  ?  ?  ?  ?  ?  ?   ?  ?  ?   ? ? ? ?Pertinent Vitals/Pain Pain Assessment ?Pain Assessment: 0-10 ?Pain Score: 1  ?Pain Location: R knee ?Pain Descriptors / Indicators: Discomfort ?Pain Intervention(s): Monitored during session  ? ? ?Home Living Family/patient expects to be discharged to:: Private residence ?Living Arrangements: Spouse/significant other;Children ?Available Help at Discharge: Family;Available PRN/intermittently ?Type of Home: House ?Home Access: Stairs to enter ?Entrance Stairs-Rails: Right;Left;Can reach both ?Entrance Stairs-Number of Steps: 5 ?  ?Home Layout: Two level;Able to live on main level with bedroom/bathroom ?Home Equipment: None ?   ?  ?Prior Function Prior Level of Function : Independent/Modified Independent ?  ?  ?  ?  ?  ?  ?  ?  ?  ? ? ?Hand Dominance  ? Dominant Hand: Right ? ?  ?Extremity/Trunk Assessment  ? Upper Extremity Assessment ?Upper Extremity Assessment: Overall WFL for tasks assessed ?  ? ?Lower Extremity Assessment ?Lower Extremity Assessment: Generalized weakness;RLE deficits/detail (At least 3+/5 bilaterally) ?RLE Sensation: decreased light touch ?  ? ?   ?Communication  ? Communication: No difficulties  ?Cognition Arousal/Alertness: Awake/alert ?Behavior During Therapy: [facility] for tasks assessed/performed ?Overall Cognitive Status: Within Functional Limits for tasks assessed ?  ?  ?  ?  ?  ?  ?  ?  ?  ?  ?  ?  ?  ?  ?  ?  ?General Comments: A&Ox3- self, location, situation ?  ?  ? ?  ?General Comments   ? ?  ?Exercises Other Exercises ?Other Exercises: patient educated on role of PT in acute setting, WBing precautions, fall risk, d/c recommendations ?Range of Motion: 73 degrees knee flexion  ? ?Assessment/Plan  ?  ?PT Assessment Patient needs continued PT services  ?PT Problem List Decreased strength;Decreased balance;Decreased range of motion;Decreased mobility;Decreased activity tolerance;Pain ? ?   ?  ?PT Treatment Interventions     ? ?PT Goals (Current goals can be found in the Care  Plan section)  ?Acute Rehab PT Goals ?Patient Stated Goal: to get better ?PT Goal Formulation: With patient ?Time For Goal Achievement: 05/23/21 ?Potential to Achieve Goals: Good ? ?  ?Frequency BID ?  ? ? ?Co-evaluation   ?  ?  ?  ?  ? ? ?  ?AM-PAC PT "6 Clicks" Mobility  ?Outcome Measure Help needed turning from your back to your side while in a flat bed without using bedrails?: None ?Help needed moving from lying on your back to sitting on the side of a flat bed without using bedrails?: None ?Help needed moving to and from a bed to a chair (including a wheelchair)?: A Little ?Help needed standing up from a chair using your arms (e.g., wheelchair or bedside chair)?: A Little ?Help needed to walk in hospital room?: A Little ?Help needed climbing 3-5 steps with a railing? : A Little ?6 Click Score: 20 ? ?  ?End of Session Equipment Utilized During Treatment: Gait belt ?Activity Tolerance: Patient tolerated treatment well ?Patient left: in bed;with call bell/phone within reach;with bed alarm set ?Nurse Communication: Mobility status ?PT Visit Diagnosis: Unsteadiness on feet (R26.81);Muscle weakness (generalized) (M62.81);Difficulty in walking, not elsewhere classified (R26.2);Pain ?Pain - Right/Left: Right ?Pain - part of body: Knee ?  ? ?Time:  5993-5701 ?PT Time Calculation (min) (ACUTE ONLY): 37 min ? ? ?Charges:   PT Evaluation ?$PT Eval Low Complexity: 1 Low ?PT Treatments ?$Gait Training: 8-22 mins ?  ?   ? ? ?Iva Boop, PT  ?05/09/21. 3:56 PM ? ? ?

## 2021-05-09 NOTE — H&P (Signed)
PREOPERATIVE H&P ? ?Chief Complaint: Right Knee Osteoarthritis ? ?HPI: ?Mark Hernandez. is a 61 y.o. male who presents for preoperative history and physical with a diagnosis of Right Knee Osteoarthritis. The patient states he is having severe pain in the right knee, which is affecting his ability to weightbear and ambulate. The patient is a retired IT sales professional and works for the [employer] and is on his feet much of the day. He states when he stands from a seated position he has to wait several minutes before he is able to weightbear. He states the pain radiates into his lower leg and up his thigh. He is having lateral pain as well as more diffuse pain across the entire right knee. He states that his knee feels unstable. He has not had any recent injury. The patient is status post arthroscopic partial medial meniscectomy with extensive synovectomy and chondroplasty of medial femoral condyle and patellofemoral compartment on 01/13/2019.  His right knee pain is significantly impairing activities of daily living.  He has failed nonoperative management and wished to proceed with a right total knee arthroplasty.  X-rays of the right knee demonstrate tricompartmental osteoarthritis with joint space narrowing, marginal osteophytes and subchondral sclerosis. ? ?Past Medical History:  ?Diagnosis Date  ? Anxiety   ? Arthritis   ? BOTH HANDS  ? BPH (benign prostatic hypertrophy)   ? Elevated PSA   ? History of kidney stones   ? History of methicillin resistant staphylococcus aureus (MRSA) 2008  ? HLD (hyperlipidemia)   ? Hx MRSA infection 2009  ? Nocturia   ? Over weight   ? Pulmonary nodule   ? Rotator cuff tear   ? Seizures (HCC) 2017  ? ?Past Surgical History:  ?Procedure Laterality Date  ? COLONOSCOPY  2014?  ? INGUINAL HERNIA REPAIR Left 02/17/2015  ? Procedure: LAPAROSCOPIC INGUINAL HERNIA;  Surgeon: Kieth Brightly, MD;  Location: [facility] ORS;  Service: General;  Laterality: Left;  ? KNEE ARTHROSCOPY  WITH MEDIAL MENISECTOMY Right 01/13/2019  ? Procedure: KNEE ARTHROSCOPY WITH MEDIAL MENISECTOMY;  Surgeon: Juanell Fairly, MD;  Location: [facility] ORS;  Service: Orthopedics;  Laterality: Right;  ? LITHOTRIPSY    ? x 2  ? SHOULDER ARTHROSCOPY WITH OPEN ROTATOR CUFF REPAIR Left 09/23/2014  ? Procedure: SHOULDER ARTHROSCOPY , debridement, decompression, WITH  mini OPEN ROTATOR CUFF REPAIR;  Surgeon: Christena Flake, MD;  Location: [facility] ORS;  Service: Orthopedics;  Laterality: Left;  ? SHOULDER SURGERY Bilateral   ? rotator cuff repair  ? UMBILICAL HERNIA REPAIR N/A 02/17/2015  ? Procedure: LAPAROSCOPIC UMBILICAL HERNIA;  Surgeon: Kieth Brightly, MD;  Location: [facility] ORS;  Service: General;  Laterality: N/A;  ? ?Social History  ? ?Socioeconomic History  ? Marital status: Married  ?  Spouse name: Rosalita Chessman  ? Number of children: 2  ? Years of education: 47  ? Highest education level: Not on file  ?Occupational History  ? Occupation: Engineer, structural   ?Tobacco Use  ? Smoking status: Never  ? Smokeless tobacco: Former  ?  Types: Chew  ?  Quit date: 05/07/2004  ?Vaping Use  ? Vaping Use: Never used  ?Substance and Sexual Activity  ? Alcohol use: Yes  ?  Comment: occ  ? Drug use: No  ? Sexual activity: Not on file  ?Other Topics Concern  ? Not on file  ?Social History Narrative  ? Married 1997, 2 children  ? Ambidextrous  ? Associate's degree  ? 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 Caffeine use: Tea  ? ?Social Determinants of Health  ? ?Financial Resource Strain: Not on file  ?Food Insecurity: Not on file  ?Transportation Needs: Not on file  ?Physical Activity: Not on file  ?Stress: Not on file  ?Social Connections: Not on file  ? ?Family History  ?Problem Relation Age of Onset  ? COPD Mother   ? Lupus Mother   ? Prostate cancer Father   ? Colon cancer Paternal Grandmother   ? Kidney disease Paternal Uncle   ? Kidney cancer Paternal Uncle   ? ?Allergies  ?Allergen Reactions  ? Penicillins Rash  ?  As a  child ?  ? ?Prior to Admission medications   ?Medication Sig Start Date End Date Taking? Authorizing Provider  ?atorvastatin (LIPITOR) 10 MG tablet Take 1 tablet (10 mg total) by mouth daily. ?Patient taking differently: Take 10 mg by mouth at bedtime. 09/01/20  Yes Joaquim Nam, MD  ?escitalopram (LEXAPRO) 10 MG tablet Take 1 tablet (10 mg total) by mouth daily. ?Patient taking differently: Take 10 mg by mouth every morning. 09/01/20  Yes Joaquim Nam, MD  ?glucosamine-chondroitin 500-400 MG tablet Take 1 tablet by mouth daily.   Yes [provider]  ?Lacosamide 150 MG TABS TAKE 1 TABLET BY MOUTH TWICE A DAY 03/13/21  Yes Butch Penny, NP  ?meloxicam (MOBIC) 7.5 MG tablet Take 7.5 mg by mouth daily. 03/20/21  Yes [provider]  ?Turmeric 500 MG TABS Take 500 mg by mouth.   Yes [provider]  ? ? ? ?Positive ROS: All other systems have been reviewed and were otherwise negative with the exception of those mentioned in the HPI and as above. ? ?Physical Exam: ?General: Alert, no acute distress ?Cardiovascular: Regular rate and rhythm, no murmurs rubs or gallops.  No pedal edema ?Respiratory: Clear to auscultation bilaterally, no wheezes rales or rhonchi. No cyanosis, no use of accessory musculature ?GI: No organomegaly, abdomen is soft and non-tender nondistended with positive bowel sounds. ?Skin: Skin intact, no lesions within the operative field. ?Neurologic: Sensation intact distally ?Psychiatric: Patient is competent for consent with normal mood and affect ?Lymphatic: No cervical lymphadenopathy ? ?MUSCULOSKELETAL: Right Knee: The patient can fully extend and flex to approximately 120 degrees. He demonstrates patellofemoral crepitus. He has moderate medial and lateral joint line tenderness.  Patient has no instability.  He has no lower leg edema. Distally, he is neurovascularly intact.  ? ?Radiology: X-ray films of the right knee were taken today in clinic today and reviewed and  interpreted by me. These films demonstrate compartmental osteoarthritis with near bone-on-bone contact in the medial compartment with marginal osteophytes both medially and laterally. The patient has subchondral sclerosis and cyst formation. Appears to have a possible subchondral fracture of the medial femoral condyle.  ? ?Assessment: ?Right Knee Osteoarthritis ? ?Plan: ?Plan for Procedure(s): ?RIGHT TOTAL KNEE ARTHROPLASTY ? ?Admit the patient the preoperative area this morning.  His wife is at the bedside.  I reviewed the details of the operation as well as the postoperative course with the patient.  I marked the right leg according to hospital's correct site of surgery protocol.  A preop history and physical was performed at the bedside.  I answered all the patient's questions. ? ?I also discussed the risks and benefits of surgery. They understand risks include but are not limited to infection, bleeding requiring blood transfusion, nerve or blood vessel injury, joint stiffness or loss of motion, persistent  pain, weakness or instability, fracture, dislocation and hardware failure and the need for further surgery.  Patient understood these risks and wished to proceed.  ? ? ? ?Juanell FairlyKevin Kamry Faraci, MD ? ? ?05/09/2021 ?10:42 AM ?  ?

## 2021-05-09 NOTE — Anesthesia Preprocedure Evaluation (Signed)
Anesthesia Evaluation  ?Patient identified by MRN, date of birth, ID band ?Patient awake ? ? ? ?Reviewed: ?Allergy & Precautions, H&P , NPO status , Patient's Chart, lab work & pertinent test results ? ?History of Anesthesia Complications ?Negative for: history of anesthetic complications ? ?Airway ?Mallampati: III ? ?TM Distance: >3 FB ?Neck ROM: full ? ? ? Dental ? ?(+) Poor Dentition, Chipped, Dental Advidsory Given ?  ?Pulmonary ?neg pulmonary ROS, neg shortness of breath,  ?  ?Pulmonary exam normal ?breath sounds clear to auscultation ? ? ? ? ? ? Cardiovascular ?Exercise Tolerance: Good ?(-) angina(-) Past MI and (-) DOE negative cardio ROS ?Normal cardiovascular exam ?Rhythm:regular Rate:Normal ? ? ?  ?Neuro/Psych ?PSYCHIATRIC DISORDERS Anxiety negative neurological ROS ?   ? GI/Hepatic ?negative GI ROS, Neg liver ROS,   ?Endo/Other  ?negative endocrine ROS ? Renal/GU ?Renal disease  ?negative genitourinary ?  ?Musculoskeletal ? ? Abdominal ?  ?Peds ? Hematology ?negative hematology ROS ?(+)   ?Anesthesia Other Findings ?Past Medical History: ?  Kidney stone                                               ?  Rotator cuff tear                                          ?  Anxiety                                                    ?  Arthritis                                                  ?    Comment:BOTH HANDS ?  Hx MRSA infection                               2009       ? ?Past Surgical History: ?  LITHOTRIPSY                                                 ?  SHOULDER SURGERY                                Bilateral            ?    Comment:rotator cuff repair ?  SHOULDER ARTHROSCOPY WITH OPEN ROTATOR CUFF RE* Left 09/23/2014  ?    Comment:Procedure: SHOULDER ARTHROSCOPY , debridement,  ?             decompression, WITH  mini OPEN ROTATOR CUFF  ?             REPAIR;  Surgeon: Christena Flake, MD;  Location:  ?  [facility] ORS;  Service: Orthopedics;  Laterality:  ?              Left; ?  COLONOSCOPY                                      2014?      ? ?BMI   ? Body Mass Index  ? 32.47 kg/m 2  ?  ?Signs and symptoms suggestive of sleep apnea  ? ? ? Reproductive/Obstetrics ?negative OB ROS ? ?  ? ? ? ? ? ? ? ? ? ? ? ? ? ?  ?  ? ? ? ? ? ? ? ? ?Anesthesia Physical ? ?Anesthesia Plan ? ?ASA: 2 ? ?Anesthesia Plan: Spinal  ? ?Post-op Pain Management:   ? ?Induction: Intravenous ? ?PONV Risk Score and Plan: Ondansetron, Dexamethasone, Midazolam and Treatment may vary due to age or medical condition ? ?Airway Management Planned: Natural Airway and Simple Face Mask ? ?Additional Equipment:  ? ?Intra-op Plan:  ? ?Post-operative Plan:  ? ?Informed Consent: I have reviewed the patients History and Physical, chart, labs and discussed the procedure including the risks, benefits and alternatives for the proposed anesthesia with the patient or authorized representative who has indicated his/her understanding and acceptance.  ? ? ? ?Dental Advisory Given ? ?Plan Discussed with: Anesthesiologist, CRNA and Surgeon ? ?Anesthesia Plan Comments:   ? ? ? ? ? ? ?Anesthesia Quick Evaluation ? ?

## 2021-05-09 NOTE — Op Note (Signed)
DATE OF SURGERY:  05/09/2021 ?TIME: 10:48 AM ? ?PATIENT NAME:  Mark Hernandez.   ?AGE: 61 y.o.  ? ? ?PRE-OPERATIVE DIAGNOSIS:  Right Knee Osteoarthritis ? ?POST-OPERATIVE DIAGNOSIS:  Same ? ?PROCEDURE:  Procedure(s): ?RIGHT TOTAL KNEE ARTHROPLASTY ? ?SURGEON:  Juanell Fairly, MD  ? ?ASSISTANT:  Lanney Gins, PA ? ?OPERATIVE IMPLANTS: Depuy PFC Sigma, Posterior Stabilized Femural component size 4, Tibia size rotating platform component size 4, Patella polyethylene 3-peg oval button size 38, with a 10 mm polyethylene insert. ? ?EBL:  50 cc ? ?TOURNIQUET TIME:  82 minutes ? ?PREOPERATIVE INDICATIONS: ? ?Mark Hernandez. is an 61 y.o. male who has a diagnosis of  Right Knee Osteoarthritis and elected for a right total knee arthroplasty after failing nonoperative treatment.  Their knee pain significantly impacts their activity of daily living.  Radiographs have demonstrated tricompartmental osteoarthritis joint space narrowing, osteophytes and subchondral sclerosis. ? ?The risks, benefits, and alternatives were discussed at length including but not limited to the risks of infection, bleeding, nerve or blood vessel injury, knee stiffness, fracture, dislocation, loosening or failure of the hardware and the need for further surgery. Medical risks include but not limited to DVT and pulmonary embolism, myocardial infarction, stroke, pneumonia, respiratory failure and death. I discussed these risks with the patient in my office prior to the date of surgery. They understood these risks and were willing to proceed. ? ?OPERATIVE FINDINGS AND UNIQUE ASPECTS OF THE CASE: Right knee tricompartmental osteoarthritis ? ?OPERATIVE DESCRIPTION: ? ?The patient was brought to the operative room and placed in a supine position after undergoing placement of a spinal anesthetic.  A Foley catheter was placed.  IV antibiotics were given. Patient received Ancef 2 g IV and clindamycin 600 mg IV.  Patient also received a dose of tranexamic acid  prior to the inflation of the tourniquet.  The lower extremity was prepped and draped in the usual sterile fashion.  A time out was performed to verify the patient's name, date of birth, medical record number, correct site of surgery and correct procedure to be performed. The timeout was also used to confirm the patient received antibiotics and that appropriate instruments, implants and radiographs studies were available in the room.  The leg was elevated and exsanguinated with an Esmarch and the tourniquet was inflated to 275 mmHg for 82 minutes.. ? ?A midline incision was made over the right knee. Full-thickness skin flaps were developed. A medial parapatellar arthrotomy was then made and the patella everted and the knee was brought into 90? of flexion. Hoffa's fat pad along with the cruciate ligaments and medial and lateral menisci were resected.  ? ?The distal femoral intramedullary canal was opened with a drill and the intramedullary distal femoral cutting jig was inserted into the femoral canal pinned into position. It was set at 5 degrees resecting 10 mm off the distal femur.  Care was taken to protect the collateral ligaments during distal femoral resection.  The distal femoral resection was performed with an oscillating saw. The femoral cutting guide was then removed. ? ?The extramedullary tibial cutting guide was then placed using the anterior tibial crest and second ray of the foot as a references.  The tibial cutting guide was adjusted to allow for appropriate posterior slope.  The tibial cutting block was pinned into position. The slotted stylus was used to measure the proximal tibial resection of 10 mm off the high lateral side.  The tibial long rod alignment guide was then used  to confirm position of the cutting block. A third cross pin through the tibial cutting block was then drilled into position to allow for rotational stability. Care was taken during the tibial resection to protect the medial and  collateral ligaments.  The resected tibial bone was removed along with the posterior horns of the menisci.  The PCL was sacrificed.  Extension gap was measured with a spacer block and alignment and extension was confirmed using a long alignment rod. ? ?The attention was then turned back to the femur. The posterior referencing distal femoral sizing guide was applied to the distal femur.  The femur was sized to be a size 4. Rotation of the referencing guide was checked with the epicondylar axis and Whitesides line. Then the 4-in-1 cutting jig was then applied to the distal femur. A stylus was used to confirm that the anterior femur would not be notched.   Then the anterior, posterior and chamfer femoral cuts were then made with an oscillating saw.  The flexion gap was then measured with a flexion spacer block and long alignment rod and was found to be symmetric with the extension gap and perpendicular to mechanical axis of the tibia. ? ?The distal femoral preparation was completed by performing the posterior stabilized box cut using the cutting block. The entry site for the intramedullary femoral guide was filled with autologous bone graft from bone previously resected earlier in the case. ? ?The proximal tibia plateau was then sized with trial trays. The best coverage was achieved with a size 4. This tibial tray was then pinned into position. The proximal tibia was then prepared with the reamer and keel punch.  After tibial preparation was completed, all trial components were inserted with polyethylene trials.  The knee was found to have excellent balance and full motion with a size 10 mm tibial polyethylene insert..   ? ?The attention was then turned to preparation of the patella. The thickness of the patella was measured with a caliper, the diameter measured with the patella templates.  The patella resection was then made with an oscillating saw using the patella cutting guide.  The patella component diameter was  38 mm.  3 peg holes for the patella component were then drilled. The trial patella was then placed. Knee was taken through a full range of motion and deemed to be stable with the trial components. All trial components were then removed. The knee capsule was then injected with Exparel.  The knee joint capsule was injected with a mixture of quarter percent Marcaine, Toradol and morphine to assist with postoperative pain relief.  The joint was copiously irrigated with pulse lavage. ? ?The final total knee arthroplasty components were then cemented into place with a 10 mm trial polyethylene insert and all excess methylmethacrylate was removed.  The joint was again copiously irrigated. After the cement had hardened the knee was again taken through a full range of motion. It was felt to be most stable with the 10 mm tibial polyethylene insert. The actual tibial polyethylene insert was then placed.   The knee was taken through a range of motion and the patella tracked well and the knee was again irrigated copiously.   ? ?The medial arthrotomy was closed with #1 Ethibond. The subcutaneous tissue closed with 0 and 2-0 vicryl, and skin approximated with staples.  A dry sterile and compressive dressing was applied.  A Polar Care was applied to the operative knee along with a knee immobilizer. ? ?The patient  was awakened and brought to the PACU in stable and satisfactory condition.  All sharp, lap and instrument counts were correct at the conclusion the case. I spoke with the patient's wife in the postop consultation room to let her know the case had been performed without complication and the patient was stable in recovery room.  ?  ?

## 2021-05-10 DIAGNOSIS — M1711 Unilateral primary osteoarthritis, right knee: Secondary | ICD-10-CM | POA: Diagnosis not present

## 2021-05-10 LAB — CBC
HCT: 32 % — ABNORMAL LOW (ref 39.0–52.0)
Hemoglobin: 10.5 g/dL — ABNORMAL LOW (ref 13.0–17.0)
MCH: 29.6 pg (ref 26.0–34.0)
MCHC: 32.8 g/dL (ref 30.0–36.0)
MCV: 90.1 fL (ref 80.0–100.0)
Platelets: 174 10*3/uL (ref 150–400)
RBC: 3.55 MIL/uL — ABNORMAL LOW (ref 4.22–5.81)
RDW: 12.4 % (ref 11.5–15.5)
WBC: 13.4 10*3/uL — ABNORMAL HIGH (ref 4.0–10.5)
nRBC: 0 % (ref 0.0–0.2)

## 2021-05-10 LAB — BASIC METABOLIC PANEL
Anion gap: 6 (ref 5–15)
BUN: 21 mg/dL — ABNORMAL HIGH (ref 6–20)
CO2: 26 mmol/L (ref 22–32)
Calcium: 8.7 mg/dL — ABNORMAL LOW (ref 8.9–10.3)
Chloride: 105 mmol/L (ref 98–111)
Creatinine, Ser: 0.78 mg/dL (ref 0.61–1.24)
GFR, Estimated: >60 mL/min (ref >60)
Glucose, Bld: 122 mg/dL — ABNORMAL HIGH (ref 70–99)
Potassium: 4.2 mmol/L (ref 3.5–5.1)
Sodium: 137 mmol/L (ref 135–145)

## 2021-05-10 MED ORDER — DOCUSATE SODIUM 100 MG PO CAPS: 100.0000 mg | ORAL_CAPSULE | Freq: Two times a day (BID) | ORAL | 0 refills | Status: DC

## 2021-05-10 MED ORDER — BISACODYL 5 MG PO TBEC: 10.0000 mg | DELAYED_RELEASE_TABLET | Freq: Every day | ORAL | 0 refills | Status: DC | PRN

## 2021-05-10 MED ORDER — OXYCODONE HCL 5 MG PO TABS: 5.0000 mg | ORAL_TABLET | ORAL | 0 refills | Status: DC | PRN

## 2021-05-10 MED ORDER — ASPIRIN EC 325 MG PO TBEC
325.0000 mg | DELAYED_RELEASE_TABLET | Freq: Two times a day (BID) | ORAL | 0 refills | Status: DC
Start: 2021-05-10 — End: 2021-09-18

## 2021-05-10 NOTE — Progress Notes (Signed)
Met with the patient at the bedside with his wife, he needs a RW and refuses a 3 in 1, He has an outpatient PT appointment on Monday ?He has transportation with his wife ?

## 2021-05-10 NOTE — Discharge Summary (Signed)
Physician Discharge Summary  ?Patient ID: ?Mark Connersony G Taira Jr. ?MRN: [id number] ?DOB/AGE: Jul 14, 1960 60 y.o. ? ?Admit date: 05/09/2021 ?Discharge date: 05/10/2021 ? ?Admission Diagnoses:  ?Right Knee Osteoarthritis ?S/P TKR (total knee replacement) using cement, right ? ?Discharge Diagnoses:  ?Right Knee Osteoarthritis ?Principal Problem: ?  S/P TKR (total knee replacement) using cement, right ? ? ?Past Medical History:  ?Diagnosis Date  ? Anxiety   ? Arthritis   ? BOTH HANDS  ? BPH (benign prostatic hypertrophy)   ? Elevated PSA   ? History of kidney stones   ? History of methicillin resistant staphylococcus aureus (MRSA) 2008  ? HLD (hyperlipidemia)   ? Hx MRSA infection 2009  ? Nocturia   ? Over weight   ? Pulmonary nodule   ? Rotator cuff tear   ? Seizures (HCC) 2017  ? ? ?Surgeries: Procedure(s): ?TOTAL KNEE ARTHROPLASTY on 05/09/2021 ?  ?Consultants (if any): Treatment Team:  ?Juanell FairlyKrasinski, Mandie Crabbe, MD ? ?Discharged Condition: Improved ? ?Hospital Course: Mark Connersony G Ebbert Jr. is an 61 y.o. male who was admitted 05/09/2021 with a diagnosis of  Right Knee Osteoarthritis S/P TKR (total knee replacement) using cement, right and went to the operating room on 05/09/2021 and underwent an uncomplicated right total knee arthroplasty.   ? ?He was given perioperative antibiotics:  ?Anti-infectives (From admission, onward)  ? ? Start     Dose/Rate Route Frequency Ordered Stop  ? 05/09/21 1400  ceFAZolin (ANCEF) IVPB 2g/100 mL premix       ? 2 g ?200 mL/hr over 30 Minutes Intravenous Every 6 hours 05/09/21 1346 05/09/21 2146  ? 05/09/21 1347  ceFAZolin (ANCEF) 2-4 GM/100ML-% IVPB       ?Note to Pharmacy: Chaya JanJoyce, Heather M: cabinet override  ?    05/09/21 1347 05/09/21 1444  ? 05/09/21 0712  clindamycin (CLEOCIN) 600 MG/50ML IVPB       ?Note to Pharmacy: Brain HiltsFuentes, Silvia F: cabinet override  ?    05/09/21 0712 05/09/21 0903  ? 05/09/21 0712  ceFAZolin (ANCEF) 2-4 GM/100ML-% IVPB       ?Note to Pharmacy: Brain HiltsFuentes, Silvia F: cabinet override  ?     05/09/21 [id number] 05/09/21 1420  ? 05/09/21 0646  sodium chloride 0.9 % with cefoTEtan (CEFOTAN) ADS Med       ?Note to Pharmacy: Carma LairMachia, Millissa L: cabinet override  ?    05/09/21 0646 05/09/21 1445  ? 05/09/21 0645  ceFAZolin (ANCEF) IVPB 2g/100 mL premix       ? 2 g ?200 mL/hr over 30 Minutes Intravenous On call to O.R. 05/09/21 [id number] 05/09/21 0815  ? 05/08/21 2330  clindamycin (CLEOCIN) IVPB 600 mg       ? 600 mg ?100 mL/hr over 30 Minutes Intravenous  Once 05/08/21 2324 05/09/21 0815  ? ?  ?. ? ?He was given sequential compression devices, early ambulation, and Lovenox for DVT prophylaxis. ? ?Patient was admitted overnight for IV antibiotics, pain control and physical therapy evaluation.  Patient is doing well postop.  His pain is controlled.  He has progressed well with physical therapy and is ready for discharge home today. ? ?He benefited maximally from the hospital stay and there were no complications.   ? ?Recent vital signs:  ?Vitals:  ? 05/10/21 0700 05/10/21 1104  ?BP: 138/75 (!) 141/96  ?Pulse: 74 69  ?Resp: 19 19  ?Temp: 98.3 ?F (36.8 ?C) 98.2 ?F (36.8 ?C)  ?SpO2: 96% 99%  ? ? ?Recent laboratory studies:  ?Lab Results  ?Component  Value Date  ? HGB 10.5 (L) 05/10/2021  ? HGB 13.6 04/18/2021  ? HGB 13.4 01/09/2019  ? ?Lab Results  ?Component Value Date  ? WBC 13.4 (H) 05/10/2021  ? PLT 174 05/10/2021  ? ?Lab Results  ?Component Value Date  ? INR 0.9 04/28/2021  ? ?Lab Results  ?Component Value Date  ? NA 137 05/10/2021  ? K 4.2 05/10/2021  ? CL 105 05/10/2021  ? CO2 26 05/10/2021  ? BUN 21 (H) 05/10/2021  ? CREATININE 0.78 05/10/2021  ? GLUCOSE 122 (H) 05/10/2021  ? ? ?Discharge Medications:   ?Allergies as of 05/10/2021   ? ?   Reactions  ? Penicillins Rash  ? As a child  ? ?  ? ?  ?Medication List  ?  ? ?STOP taking these medications   ? ?meloxicam 7.5 MG tablet ?Commonly known as: MOBIC ?  ?Turmeric 500 MG Tabs ?  ? ?  ? ?TAKE these medications   ? ?aspirin EC 325 MG tablet ?Take 1 tablet (325 mg total)  by mouth 2 (two) times daily. ?  ?atorvastatin 10 MG tablet ?Commonly known as: LIPITOR ?Take 1 tablet (10 mg total) by mouth daily. ?What changed: when to take this ?  ?bisacodyl 5 MG EC tablet ?Commonly known as: DULCOLAX ?Take 2 tablets (10 mg total) by mouth daily as needed for moderate constipation. ?  ?docusate sodium 100 MG capsule ?Commonly known as: COLACE ?Take 1 capsule (100 mg total) by mouth 2 (two) times daily. ?  ?escitalopram 10 MG tablet ?Commonly known as: LEXAPRO ?Take 1 tablet (10 mg total) by mouth daily. ?What changed: when to take this ?  ?glucosamine-chondroitin 500-400 MG tablet ?Take 1 tablet by mouth daily. ?  ?Lacosamide 150 MG Tabs ?TAKE 1 TABLET BY MOUTH TWICE A DAY ?  ?oxyCODONE 5 MG immediate release tablet ?Commonly known as: Oxy IR/ROXICODONE ?Take 1-2 tablets (5-10 mg total) by mouth every 4 (four) hours as needed for moderate pain (for post-operative pain.   Total knee arthroplasty on 05/09/21). ?  ? ?  ? ? ?Diagnostic Studies: DG Knee 1-2 Views Right ? ?Result Date: 05/09/2021 ?CLINICAL DATA:  Postop right knee replacement. EXAM: RIGHT KNEE - 1-2 VIEW COMPARISON:  Right knee radiographs 10/24/2018 FINDINGS: Sequelae of total knee arthroplasty are identified. The prosthetic components appear normally located, and no acute fracture is identified. Postoperative gas is noted in the knee joint and surrounding soft tissues, and skin staples are in place. IMPRESSION: Right total knee arthroplasty without evidence of acute osseous abnormality. Electronically Signed   By: Sebastian Ache M.D.   On: 05/09/2021 11:14   ? ?Disposition: Discharge disposition: 01-Home or Self Care ? ? ? ? ? ? ?Discharge Instructions   ? ? Call MD / Call 911   Complete by: As directed ?  ? If you experience chest pain or shortness of breath, CALL 911 and be transported to the hospital emergency room.  If you develope a fever above 101 F, pus (white drainage) or increased drainage or redness at the wound, or calf  pain, call your surgeon's office.  ? Constipation Prevention   Complete by: As directed ?  ? Drink plenty of fluids.  Prune juice may be helpful.  You may use a stool softener, such as Colace (over the counter) 100 mg twice a day.  Use MiraLax (over the counter) for constipation as needed.  ? Diet general   Complete by: As directed ?  ? Discharge instructions  Complete by: As directed ?  ? Follow-up with Dr. Martha Clan in the office on 05-22-2021 @ 11:30 AM ? ?Patient's first postoperative physical therapy visit at [facility] in [place] is on 05-15-2021 @ 11:00 AM WITH Berenice Primas, PT ? ?The patient may continue to bear weight on the right lower extremity with use of a walker. The patient should continue to use TED stockings until follow-up. Patient should remove the TED stockings at night for sleep. The patient needs to continue to elevate the right lower extremity whenever possible. The knee immobilizer should be used at night. The patient may remove the knee immobilizer to perform exercises or sit in a chair during the day.  Patient should not place a pillow under their knee. The Polar Care may be used by the patient for comfort.  The dressing should remain on until follow up in the office.   The patient must cover the right knee dressing/incision during showers with a plastic bag or Saran wrap.  The patient will take aspirin 325mg  twice a day for blood clot prevention and continue to work on knee range of motion exercises at home as instructed by physical therapy until follow-up in the office.  ? Driving restrictions   Complete by: As directed ?  ? No driving for 6-8 weeks  ? Increase activity slowly as tolerated   Complete by: As directed ?  ? Lifting restrictions   Complete by: As directed ?  ? No lifting for 12-16 weeks  ? Post-operative opioid taper instructions:   Complete by: As directed ?  ? POST-OPERATIVE OPIOID TAPER INSTRUCTIONS: ?It is important to wean off of your opioid medication as soon  as possible. If you do not need pain medication after your surgery it is ok to stop day one. ?Opioids include: ?Codeine, Hydrocodone(Norco, Vicodin), Oxycodone(Percocet, oxycontin) and hydromorphone amongs

## 2021-05-10 NOTE — Progress Notes (Signed)
Physical Therapy Treatment ?Patient Details ?Name: Mark Hernandez. ?MRN: [id number] ?DOB: [birth year] ?Today's Date: 05/10/2021 ? ? ?History of Present Illness Patient is a 61 year old male s/p TKA (R). ? ?  ?PT Comments  ? ? Physical Therapy treatment completed this session. Patient tolerated session well and was agreeable to treatment. Upon arrival patient was supine in bed, and reported 4-5/10 pain in R knee. Patient was able to complete supine to sit at Mod I with increased effort, and sit to stand from EOB was supervision with RW. Good dynamic standing balance was demonstrated when patient brushed his teeth, and reached down to the ground to grab him overnight back. Both completed at Supervision. Patient ambulated ~150 feet with RW at Supervision from room>therapy gym>room, and ascended and descended 8 steps with bilateral hand rail support with a step to pattern. HEP packet reviewed with patient, and patient verbalized understanding. Patient is progressing towards his goals and would continue to benefit from skilled physical therapy in order to optimize patient's return to PLOF. Continue to recommend HHPT upon discharge from acute hospitalization.  ?  ?Recommendations for follow up therapy are one component of a multi-disciplinary discharge planning process, led by the attending physician.  Recommendations may be updated based on patient status, additional functional criteria and insurance authorization. ? ?Follow Up Recommendations ? Home health PT ?  ?  ?Assistance Recommended at Discharge Intermittent Supervision/Assistance  ?Patient can return home with the following A little help with walking and/or transfers;Help with stairs or ramp for entrance;A little help with bathing/dressing/bathroom ?  ?Equipment Recommendations ? Rolling walker (2 wheels)  ?  ?Recommendations for Other Services   ? ? ?  ?Precautions / Restrictions Precautions ?Precautions: Knee;Fall ?Precaution Booklet Issued: Yes  (comment) ?Restrictions ?Weight Bearing Restrictions: Yes ?RLE Weight Bearing: Weight bearing as tolerated  ?  ? ?Mobility ? Bed Mobility ?Overal bed mobility: Needs Assistance ?Bed Mobility: Supine to Sit ?  ?  ?Supine to sit: Modified independent (Device/Increase time) ?  ?  ?  ?  ? ?Transfers ?Overall transfer level: Needs assistance ?Equipment used: Rolling walker (2 wheels) ?Transfers: Sit to/from Stand ?Sit to Stand: Supervision ?  ?  ?  ?  ?  ?  ?  ? ?Ambulation/Gait ?Ambulation/Gait assistance: Supervision ?Gait Distance (Feet): 250 Feet ?Assistive device: Rolling walker (2 wheels) ?Gait Pattern/deviations: Step-through pattern, Decreased step length - right, Decreased step length - left, Decreased stride length, Narrow base of support ?Gait velocity: decreased ?  ?  ?General Gait Details: no LOB noted ? ? ?Stairs ?Stairs: Yes ?Stairs assistance: Supervision ?Stair Management: Two rails, Step to pattern ?Number of Stairs: 8 ?General stair comments: patient educated on proper stair sequencing ? ? ?Wheelchair Mobility ?  ? ?Modified Rankin (Stroke Patients Only) ?  ? ? ?  ?Balance Overall balance assessment: Needs assistance ?Sitting-balance support: Bilateral upper extremity supported, Feet supported ?Sitting balance-Leahy Scale: Good ?  ?  ?Standing balance support: Bilateral upper extremity supported, During functional activity, Reliant on assistive device for balance ?Standing balance-Leahy Scale: Good ?Standing balance comment: demonstrated good dynamic standing balance when brushing teeth ?  ?  ?  ?  ?  ?  ?  ?  ?  ?  ?  ?  ? ?  ?Cognition Arousal/Alertness: Awake/alert ?Behavior During Therapy: [facility] for tasks assessed/performed ?Overall Cognitive Status: Within Functional Limits for tasks assessed ?  ?  ?  ?  ?  ?  ?  ?  ?  ?  ?  ?  ?  ?  ?  ?  ?  ?  ?  ? ?  ?  Exercises   ? ?  ?General Comments   ?  ?  ? ?Pertinent Vitals/Pain Pain Assessment ?Pain Score: 5  ?Pain Location: R knee ?Pain Descriptors /  Indicators: Discomfort ?Pain Intervention(s): Monitored during session, Repositioned  ? ? ?Home Living   ?  ?  ?  ?  ?  ?  ?  ?  ?  ?   ?  ?Prior Function    ?  ?  ?   ? ?PT Goals (current goals can now be found in the care plan section) Acute Rehab PT Goals ?Patient Stated Goal: to get better ?PT Goal Formulation: With patient ?Time For Goal Achievement: 05/23/21 ?Potential to Achieve Goals: Good ?Progress towards PT goals: Progressing toward goals ? ?  ?Frequency ? ? ? BID ? ? ? ?  ?PT Plan Current plan remains appropriate  ? ? ?Co-evaluation   ?  ?  ?  ?  ? ?  ?AM-PAC PT "6 Clicks" Mobility   ?Outcome Measure ? Help needed turning from your back to your side while in a flat bed without using bedrails?: None ?Help needed moving from lying on your back to sitting on the side of a flat bed without using bedrails?: None ?Help needed moving to and from a bed to a chair (including a wheelchair)?: A Little ?Help needed standing up from a chair using your arms (e.g., wheelchair or bedside chair)?: A Little ?Help needed to walk in hospital room?: A Little ?Help needed climbing 3-5 steps with a railing? : A Little ?6 Click Score: 20 ? ?  ?End of Session Equipment Utilized During Treatment: Gait belt ?Activity Tolerance: Patient tolerated treatment well ?Patient left: in chair;with call bell/phone within reach;with chair alarm set ?Nurse Communication: Mobility status ?PT Visit Diagnosis: Unsteadiness on feet (R26.81);Muscle weakness (generalized) (M62.81);Difficulty in walking, not elsewhere classified (R26.2);Pain ?Pain - Right/Left: Right ?Pain - part of body: Knee ?  ? ? ?Time: [id number] ?PT Time Calculation (min) (ACUTE ONLY): 28 min ? ?Charges:  $Gait Training: 8-22 mins ?$Therapeutic Activity: 8-22 mins          ?          ? ?Iva Boop, PT  ?05/10/21. 9:01 AM ? ? ?

## 2021-05-10 NOTE — Plan of Care (Signed)

## 2021-05-10 NOTE — Anesthesia Postprocedure Evaluation (Signed)
Anesthesia Post Note ? ?Patient: Mark Hernandez. ? ?Procedure(s) Performed: TOTAL KNEE ARTHROPLASTY (Right: Knee) ? ?Patient location during evaluation: Nursing Unit ?Anesthesia Type: Spinal ?Level of consciousness: awake, awake and alert, oriented and patient cooperative ?Pain management: pain level controlled ?Vital Signs Assessment: post-procedure vital signs reviewed and stable ?Respiratory status: spontaneous breathing, nonlabored ventilation and respiratory function stable ?Cardiovascular status: stable ?Postop Assessment: no apparent nausea or vomiting and able to ambulate ?Anesthetic complications: no ? ? ?No notable events documented. ? ? ?Last Vitals:  ?Vitals:  ? 05/10/21 0700 05/10/21 1104  ?BP: 138/75 (!) 141/96  ?Pulse: 74 69  ?Resp: 19 19  ?Temp: 36.8 ?C 36.8 ?C  ?SpO2: 96% 99%  ?  ?Last Pain:  ?Vitals:  ? 05/10/21 1104  ?TempSrc: Oral  ?PainSc: 6   ? ? ?  ?  ?  ?  ?  ?  ? ?Kolson Chovanec,  Loraine Leriche R ? ? ? ? ?

## 2021-05-10 NOTE — Progress Notes (Signed)
D/c paper avs reviewed with pt and wife. IV removed ?All questions and concerns answered. ?Awaiting walker prior to pt being d/c  ?

## 2021-05-10 NOTE — Evaluation (Signed)
Occupational Therapy Evaluation ?Patient Details ?Name: Mark Hernandez. ?MRN: [id number] ?DOB: [birth year] ?Today's Date: 05/10/2021 ? ? ?History of Present Illness Patient is a 61 year old male s/p TKA (R).  ? ?Clinical Impression ?  ?Mr Schar was seen for OT evaluation this date. Prior to hospital admission, pt was Independent for mobility including working. Pt lives with family in home c 5 STE. Pt presents to acute OT demonstrating impaired ADL performance and functional mobility 2/2 decreased activity tolerance and functional ROM deficits. Pt currently requires MOD A don B socks seated EOB. MOD A don knee immobilizer. SUPERVISION for ADL t/f. Pt instructed in polar care mgt, falls prevention strategies, home/routines modifications, DME/AE for LB bathing/dressing tasks, and compression stocking mgt. All education complete, no acute OT needs identified, will sign off. Upon hospital discharge, recommend no OT follow up.    ? ?Recommendations for follow up therapy are one component of a multi-disciplinary discharge planning process, led by the attending physician.  Recommendations may be updated based on patient status, additional functional criteria and insurance authorization.  ? ?Follow Up Recommendations ? No OT follow up  ?  ?Assistance Recommended at Discharge Intermittent Supervision/Assistance  ?Patient can return home with the following A little help with bathing/dressing/bathroom ? ?  ?Functional Status Assessment ? Patient has had a recent decline in their functional status and demonstrates the ability to make significant improvements in function in a reasonable and predictable amount of time.  ?Equipment Recommendations ? None recommended by OT  ?  ?Recommendations for Other Services   ? ? ?  ?Precautions / Restrictions Precautions ?Precautions: Knee;Fall ?Precaution Booklet Issued: Yes (comment) ?Restrictions ?Weight Bearing Restrictions: Yes ?RLE Weight Bearing: Weight bearing as tolerated  ? ?   ? ?Mobility Bed Mobility ?Overal bed mobility: Independent ?  ?  ?  ?  ?  ?  ?  ?  ? ?Transfers ?Overall transfer level: Needs assistance ?Equipment used: None ?Transfers: Sit to/from Stand ?Sit to Stand: Supervision ?  ?  ?  ?  ?  ?  ?  ? ?  ?Balance Overall balance assessment: Needs assistance ?Sitting-balance support: No upper extremity supported, Feet supported ?Sitting balance-Leahy Scale: Normal ?  ?  ?Standing balance support: No upper extremity supported, During functional activity ?Standing balance-Leahy Scale: Good ?  ?  ?  ?  ?  ?  ?  ?  ?  ?  ?  ?  ?   ? ?ADL either performed or assessed with clinical judgement  ? ?ADL Overall ADL's : Needs assistance/impaired ?  ?  ?  ?  ?  ?  ?  ?  ?  ?  ?  ?  ?  ?  ?  ?  ?  ?  ?  ?General ADL Comments: MOD A don B socks seated EOB. MOD A don knee immobilizer. SUPERVISION for ADL t/f  ? ? ? ? ?Pertinent Vitals/Pain Pain Assessment ?Pain Assessment: 0-10 ?Pain Score: 4  ?Pain Location: R knee ?Pain Descriptors / Indicators: Discomfort ?Pain Intervention(s): Limited activity within patient's tolerance, Repositioned  ? ? ? ?Hand Dominance Right ?  ?Extremity/Trunk Assessment Upper Extremity Assessment ?Upper Extremity Assessment: Overall WFL for tasks assessed ?  ?Lower Extremity Assessment ?Lower Extremity Assessment: Generalized weakness ?  ?  ?  ?Communication Communication ?Communication: No difficulties ?  ?Cognition Arousal/Alertness: Awake/alert ?Behavior During Therapy: [facility] for tasks assessed/performed ?Overall Cognitive Status: Within Functional Limits for tasks assessed ?  ?  ?  ?  ?  ?  ?  ?  ?  ?  ?  ?  ?  ?  ?  ?  ?  ?  ?  ?   ?   ?   ? ? ?  Home Living Family/patient expects to be discharged to:: Private residence ?Living Arrangements: Spouse/significant other;Children ?Available Help at Discharge: Family;Available PRN/intermittently ?Type of Home: House ?Home Access: Stairs to enter ?Entrance Stairs-Number of Steps: 5 ?Entrance Stairs-Rails: Right;Left;Can  reach both ?Home Layout: Two level;Able to live on main level with bedroom/bathroom ?  ?  ?Bathroom Shower/Tub: Tub/shower unit;Walk-in shower ?  ?Bathroom Toilet: Standard ?Bathroom Accessibility: Yes ?  ?Home Equipment: None ?  ?  ?  ? ?  ?Prior Functioning/Environment Prior Level of Function : Independent/Modified Independent ?  ?  ?  ?  ?  ?  ?  ?  ?  ? ?  ?  ?OT Problem List: Decreased strength;Decreased range of motion;Decreased activity tolerance ?  ?   ?   ?OT Goals(Current goals can be found in the care plan section) Acute Rehab OT Goals ?Patient Stated Goal: to go home ?OT Goal Formulation: With patient ?Time For Goal Achievement: 05/24/21 ?Potential to Achieve Goals: Good  ? ?Co-evaluation   ?  ?  ?  ?  ? ?  ?AM-PAC OT "6 Clicks" Daily Activity     ?Outcome Measure Help from another person eating meals?: None ?Help from another person taking care of personal grooming?: None ?Help from another person toileting, which includes using toliet, bedpan, or urinal?: A Little ?Help from another person bathing (including washing, rinsing, drying)?: A Little ?Help from another person to put on and taking off regular upper body clothing?: None ?Help from another person to put on and taking off regular lower body clothing?: A Lot ?6 Click Score: 20 ?  ?End of Session   ? ?Activity Tolerance: Patient tolerated treatment well ?Patient left: in bed;with call bell/phone within reach;with nursing/sitter in room ? ?OT Visit Diagnosis: Unsteadiness on feet (R26.81)  ?              ?Time: [id number] ?OT Time Calculation (min): 19 min ?Charges:  OT General Charges ?$OT Visit: 1 Visit ?OT Evaluation ?$OT Eval Low Complexity: 1 Low ?OT Treatments ?$Self Care/Home Management : 8-22 mins ? ?Kathie Dike, M.S. OTR/L  ?05/10/21, 9:45 AM  ?ascom [phone] ? ?

## 2021-05-10 NOTE — Progress Notes (Signed)
?Subjective: ? ?POD #1 s/p right total knee arthroplasty.   Patient reports right knee pain as mild to moderate.  Patient just recently completed physical therapy.  His wife is in the room.  He denies any nausea or vomiting.  He states he is passing flatus. ? ?Objective:  ? ?VITALS:   ?Vitals:  ? 05/09/21 2314 05/10/21 0516 05/10/21 0700 05/10/21 1104  ?BP: 120/68 133/79 138/75 (!) 141/96  ?Pulse: 72 60 74 69  ?Resp: 18 18 19 19   ?Temp:  98 ?F (36.7 ?C) 98.3 ?F (36.8 ?C) 98.2 ?F (36.8 ?C)  ?TempSrc:   Oral Oral  ?SpO2: 97% 98% 96% 99%  ?Weight:      ?Height:      ? ? ?PHYSICAL EXAM: ?Right lower extremity: ?Patient's outer compressive dressing was removed by me today.  His Aquacel dressing has scant serosanguineous drainage.  Patient has mild to moderate knee swelling without erythema or significant ecchymosis.  Distally patient has intact sensation light touch with palpable pedal pulses and intact motor function without weakness. ? ? ?LABS ? ?Results for orders placed or performed during the hospital encounter of 05/09/21 (from the past 24 hour(s))  ?CBC     Status: Abnormal  ? Collection Time: 05/10/21  4:52 AM  ?Result Value Ref Range  ? WBC 13.4 (H) 4.0 - 10.5 K/uL  ? RBC 3.55 (L) 4.22 - 5.81 MIL/uL  ? Hemoglobin 10.5 (L) 13.0 - 17.0 g/dL  ? HCT 32.0 (L) 39.0 - 52.0 %  ? MCV 90.1 80.0 - 100.0 fL  ? MCH 29.6 26.0 - 34.0 pg  ? MCHC 32.8 30.0 - 36.0 g/dL  ? RDW 12.4 11.5 - 15.5 %  ? Platelets 174 150 - 400 K/uL  ? nRBC 0.0 0.0 - 0.2 %  ?Basic metabolic panel     Status: Abnormal  ? Collection Time: 05/10/21  4:52 AM  ?Result Value Ref Range  ? Sodium 137 135 - 145 mmol/L  ? Potassium 4.2 3.5 - 5.1 mmol/L  ? Chloride 105 98 - 111 mmol/L  ? CO2 26 22 - 32 mmol/L  ? Glucose, Bld 122 (H) 70 - 99 mg/dL  ? BUN 21 (H) 6 - 20 mg/dL  ? Creatinine, Ser 0.78 0.61 - 1.24 mg/dL  ? Calcium 8.7 (L) 8.9 - 10.3 mg/dL  ? GFR, Estimated >60 >60 mL/min  ? Anion gap 6 5 - 15  ? ? ?DG Knee 1-2 Views Right ? ?Result Date:  05/09/2021 ?CLINICAL DATA:  Postop right knee replacement. EXAM: RIGHT KNEE - 1-2 VIEW COMPARISON:  Right knee radiographs 10/24/2018 FINDINGS: Sequelae of total knee arthroplasty are identified. The prosthetic components appear normally located, and no acute fracture is identified. Postoperative gas is noted in the knee joint and surrounding soft tissues, and skin staples are in place. IMPRESSION: Right total knee arthroplasty without evidence of acute osseous abnormality. Electronically Signed   By: 10/26/2018 M.D.   On: 05/09/2021 11:14   ? ?Assessment/Plan: ?1 Day Post-Op  ? ?Principal Problem: ?  S/P TKR (total knee replacement) using cement, right ? ?Patient is doing well postop.  His pain is controlled on oxycodone.  Patient has progressed well with physical and Occupational Therapy.  He will repaired for discharge home.  Patient will follow-up with me in the office for his first postop visit on 05-22-2021 11:30 AM.  He will be discharged on aspirin 325 mg p.o. twice daily for DVT prophylaxis. ? ? ? ?05-24-2021 , MD ?05/10/2021,  12:50 PM ? ? ? ? ?  ?

## 2021-05-10 NOTE — Progress Notes (Signed)
Physical Therapy Treatment ?Patient Details ?Name: Mark Hernandez. ?MRN: [id number] ?DOB: 07/08/1960 ?Today's Date: 05/10/2021 ? ? ?History of Present Illness Patient is a 61 year old male s/p TKA (R). ? ?  ?PT Comments  ? ? Physical Therapy session competed this date. Patient tolerated session well and was agreeable to treatment. Increased pain/stiffness reported this session 5/10. When attempting passive knee flexion, decreased ROM noted compared to previous sessions due to pain. Upon arrival patient was in bed with wife present, talking with TOC. Therapeutic Exercises completed in supine and seated to improve RLE strengthening and ROM. Patient completed 1 lap around the nurses station at [facility] with RW. Increased antalgic gait noted this session due to pain. Upon entry into room patient utilized the bathroom at [facility]. Patient left in bed with all needs met and family present. Patient tolerated session well and was agreeable to treatment. Continue to recommend HHPT upon discharge from acute hospitalization. ?   ?Recommendations for follow up therapy are one component of a multi-disciplinary discharge planning process, led by the attending physician.  Recommendations may be updated based on patient status, additional functional criteria and insurance authorization. ? ?Follow Up Recommendations ? Home health PT ?  ?  ?Assistance Recommended at Discharge Intermittent Supervision/Assistance  ?Patient can return home with the following A little help with walking and/or transfers;Help with stairs or ramp for entrance;A little help with bathing/dressing/bathroom ?  ?Equipment Recommendations ? Rolling walker (2 wheels)  ?  ?Recommendations for Other Services   ? ? ?  ?Precautions / Restrictions Precautions ?Precautions: Knee;Fall ?Precaution Booklet Issued: Yes (comment) ?Restrictions ?Weight Bearing Restrictions: Yes ?RLE Weight Bearing: Weight bearing as tolerated  ?  ? ?Mobility ? Bed Mobility ?Overal bed mobility: Modified  Independent ?Bed Mobility: Supine to Sit, Sit to Supine ?  ?  ?Supine to sit: Modified independent (Device/Increase time) ?Sit to supine: Modified independent (Device/Increase time) ?  ?  ?  ? ?Transfers ?Overall transfer level: Needs assistance ?Equipment used: None ?Transfers: Sit to/from Stand ?Sit to Stand: Supervision ?  ?  ?  ?  ?  ?  ?  ? ?Ambulation/Gait ?Ambulation/Gait assistance: Supervision ?Gait Distance (Feet): 180 Feet ?Assistive device: Rolling walker (2 wheels) ?Gait Pattern/deviations: Step-through pattern, Decreased step length - right, Decreased step length - left, Decreased stride length, Narrow base of support, Antalgic ?Gait velocity: decreased ?  ?  ?General Gait Details: Increased stiffness and pain noted during ambulation this session, no LOB noted ? ? ?Stairs ?Stairs: Yes ?Stairs assistance: Supervision ?Stair Management: Two rails, Step to pattern ?Number of Stairs: 8 ?General stair comments: patient educated on proper stair sequencing ? ? ?Wheelchair Mobility ?  ? ?Modified Rankin (Stroke Patients Only) ?  ? ? ?  ?Balance Overall balance assessment: Needs assistance ?Sitting-balance support: No upper extremity supported, Feet supported ?Sitting balance-Leahy Scale: Normal ?  ?  ?Standing balance support: No upper extremity supported, During functional activity ?Standing balance-Leahy Scale: Good ?Standing balance comment: demonstrated good dynamic standing balance when brushing teeth ?  ?  ?  ?  ?  ?  ?  ?  ?  ?  ?  ?  ? ?  ?Cognition Arousal/Alertness: Awake/alert ?Behavior During Therapy: [facility] for tasks assessed/performed ?Overall Cognitive Status: Within Functional Limits for tasks assessed ?  ?  ?  ?  ?  ?  ?  ?  ?  ?  ?  ?  ?  ?  ?  ?  ?  ?  ?  ? ?  ?  Exercises General Exercises - Lower Extremity ?Quad Sets: Supine, AROM, 10 reps, Both ?Short Arc Quad: Supine, 10 reps, AROM, Both ?Long Arc Quad: Seated, 10 reps, AROM, Both ?Heel Slides: Supine, Left, AROM, Right, AAROM, 10 reps,  Both ?Hip ABduction/ADduction: Supine, 10 reps, AROM, Both ?Straight Leg Raises: Supine, Left, AROM, Right, AAROM, 10 reps, Both ? ?  ?General Comments   ?  ?  ? ?Pertinent Vitals/Pain Pain Assessment ?Pain Score: 6  ?Pain Location: R knee ?Pain Descriptors / Indicators: Discomfort, Other (Comment) (Stiff) ?Pain Intervention(s): Monitored during session  ? ? ?Home Living Family/patient expects to be discharged to:: Private residence ?Living Arrangements: Spouse/significant other;Children ?Available Help at Discharge: Family;Available PRN/intermittently ?Type of Home: House ?Home Access: Stairs to enter ?Entrance Stairs-Rails: Right;Left;Can reach both ?Entrance Stairs-Number of Steps: 5 ?  ?Home Layout: Two level;Able to live on main level with bedroom/bathroom ?Home Equipment: None ?   ?  ?Prior Function    ?  ?  ?   ? ?PT Goals (current goals can now be found in the care plan section) Acute Rehab PT Goals ?Patient Stated Goal: to get better ?PT Goal Formulation: With patient ?Time For Goal Achievement: 05/23/21 ?Potential to Achieve Goals: Good ?Progress towards PT goals: Progressing toward goals ? ?  ?Frequency ? ? ? BID ? ? ? ?  ?PT Plan Current plan remains appropriate  ? ? ?Co-evaluation   ?  ?  ?  ?  ? ?  ?AM-PAC PT "6 Clicks" Mobility   ?Outcome Measure ? Help needed turning from your back to your side while in a flat bed without using bedrails?: None ?Help needed moving from lying on your back to sitting on the side of a flat bed without using bedrails?: None ?Help needed moving to and from a bed to a chair (including a wheelchair)?: A Little ?Help needed standing up from a chair using your arms (e.g., wheelchair or bedside chair)?: A Little ?Help needed to walk in hospital room?: A Little ?Help needed climbing 3-5 steps with a railing? : A Little ?6 Click Score: 20 ? ?  ?End of Session Equipment Utilized During Treatment: Gait belt ?Activity Tolerance: Patient tolerated treatment well ?Patient left: in  bed;with call bell/phone within reach;with chair alarm set ?Nurse Communication: Mobility status ?PT Visit Diagnosis: Unsteadiness on feet (R26.81);Muscle weakness (generalized) (M62.81);Difficulty in walking, not elsewhere classified (R26.2);Pain ?Pain - Right/Left: Right ?Pain - part of body: Knee ?  ? ? ?Time: 1200-1230 ?PT Time Calculation (min) (ACUTE ONLY): 30 min ? ?Charges:  $Gait Training: 8-22 mins ?$Therapeutic Exercise: 8-22 mins ?                ?Iva Boop, PT  ?05/10/21. 12:53 PM ? ? ?

## 2021-08-25 ENCOUNTER — Other Ambulatory Visit: Payer: Self-pay | Admitting: Family Medicine

## 2021-08-25 DIAGNOSIS — E785 Hyperlipidemia, unspecified: Secondary | ICD-10-CM

## 2021-08-25 NOTE — Telephone Encounter (Signed)
Patient due for CPE after 09/01/21. Please call to schedule appt.

## 2021-09-01 ENCOUNTER — Other Ambulatory Visit: Payer: Self-pay | Admitting: Family Medicine

## 2021-09-01 DIAGNOSIS — E785 Hyperlipidemia, unspecified: Secondary | ICD-10-CM

## 2021-09-09 ENCOUNTER — Other Ambulatory Visit: Payer: Self-pay | Admitting: Family Medicine

## 2021-09-11 ENCOUNTER — Other Ambulatory Visit (INDEPENDENT_AMBULATORY_CARE_PROVIDER_SITE_OTHER): Payer: 59

## 2021-09-11 DIAGNOSIS — E785 Hyperlipidemia, unspecified: Secondary | ICD-10-CM | POA: Diagnosis not present

## 2021-09-11 LAB — COMPREHENSIVE METABOLIC PANEL
ALT: 12 U/L (ref 0–53)
AST: 16 U/L (ref 0–37)
Albumin: 4.1 g/dL (ref 3.5–5.2)
Alkaline Phosphatase: 68 U/L (ref 39–117)
BUN: 18 mg/dL (ref 6–23)
CO2: 25 mEq/L (ref 19–32)
Calcium: 9.3 mg/dL (ref 8.4–10.5)
Chloride: 105 mEq/L (ref 96–112)
Creatinine, Ser: 0.79 mg/dL (ref 0.40–1.50)
GFR: 96.36 mL/min (ref >60.00)
Glucose, Bld: 96 mg/dL (ref 70–99)
Potassium: 4.2 mEq/L (ref 3.5–5.1)
Sodium: 139 mEq/L (ref 135–145)
Total Bilirubin: 0.4 mg/dL (ref 0.2–1.2)
Total Protein: 7.2 g/dL (ref 6.0–8.3)

## 2021-09-11 LAB — CBC WITH DIFFERENTIAL/PLATELET
Basophils Absolute: 0 10*3/uL (ref 0.0–0.1)
Basophils Relative: 0.6 % (ref 0.0–3.0)
Eosinophils Absolute: 0.2 10*3/uL (ref 0.0–0.7)
Eosinophils Relative: 4.5 % (ref 0.0–5.0)
HCT: 39.6 % (ref 39.0–52.0)
Hemoglobin: 13.2 g/dL (ref 13.0–17.0)
Lymphocytes Relative: 17.5 % (ref 12.0–46.0)
Lymphs Abs: 0.8 10*3/uL (ref 0.7–4.0)
MCHC: 33.2 g/dL (ref 30.0–36.0)
MCV: 88.2 fl (ref 78.0–100.0)
Monocytes Absolute: 0.5 10*3/uL (ref 0.1–1.0)
Monocytes Relative: 11.6 % (ref 3.0–12.0)
Neutro Abs: 3 10*3/uL (ref 1.4–7.7)
Neutrophils Relative %: 65.8 % (ref 43.0–77.0)
Platelets: 194 10*3/uL (ref 150.0–400.0)
RBC: 4.49 Mil/uL (ref 4.22–5.81)
RDW: 13.5 % (ref 11.5–15.5)
WBC: 4.5 10*3/uL (ref 4.0–10.5)

## 2021-09-11 LAB — LIPID PANEL
Cholesterol: 148 mg/dL (ref 0–200)
HDL: 45.5 mg/dL (ref >39.00)
LDL Cholesterol: 82 mg/dL (ref 0–99)
NonHDL: 102.23
Total CHOL/HDL Ratio: 3
Triglycerides: 103 mg/dL (ref 0.0–149.0)
VLDL: 20.6 mg/dL (ref 0.0–40.0)

## 2021-09-15 ENCOUNTER — Other Ambulatory Visit: Payer: Self-pay | Admitting: Adult Health

## 2021-09-18 ENCOUNTER — Ambulatory Visit (INDEPENDENT_AMBULATORY_CARE_PROVIDER_SITE_OTHER): Payer: 59 | Admitting: Family Medicine

## 2021-09-18 ENCOUNTER — Encounter: Payer: Self-pay | Admitting: Adult Health

## 2021-09-18 ENCOUNTER — Encounter: Payer: Self-pay | Admitting: Family Medicine

## 2021-09-18 VITALS — BP 124/70 | HR 76 | Temp 98.4°F | Ht 69.0 in | Wt 218.0 lb

## 2021-09-18 DIAGNOSIS — R918 Other nonspecific abnormal finding of lung field: Secondary | ICD-10-CM

## 2021-09-18 DIAGNOSIS — Z0000 Encounter for general adult medical examination without abnormal findings: Secondary | ICD-10-CM | POA: Diagnosis not present

## 2021-09-18 DIAGNOSIS — M25561 Pain in right knee: Secondary | ICD-10-CM

## 2021-09-18 DIAGNOSIS — E785 Hyperlipidemia, unspecified: Secondary | ICD-10-CM

## 2021-09-18 DIAGNOSIS — R911 Solitary pulmonary nodule: Secondary | ICD-10-CM

## 2021-09-18 DIAGNOSIS — F419 Anxiety disorder, unspecified: Secondary | ICD-10-CM

## 2021-09-18 DIAGNOSIS — R569 Unspecified convulsions: Secondary | ICD-10-CM

## 2021-09-18 DIAGNOSIS — Z7189 Other specified counseling: Secondary | ICD-10-CM

## 2021-09-18 MED ORDER — ESCITALOPRAM OXALATE 10 MG PO TABS: 10.0000 mg | ORAL_TABLET | Freq: Every day | ORAL | 3 refills | Status: DC

## 2021-09-18 MED ORDER — ATORVASTATIN CALCIUM 10 MG PO TABS: 10.0000 mg | ORAL_TABLET | Freq: Every day | ORAL | 3 refills | Status: DC

## 2021-09-18 NOTE — Patient Instructions (Signed)
You should get a call about scheduling the CT.  Take care.  Glad to see you. Update me as needed.   Try taking meloxicam with food prior to work.  See if that helps with knee pain.

## 2021-09-18 NOTE — Progress Notes (Unsigned)
CPE- See plan.  Routine anticipatory guidance given to patient.  See health maintenance.  The possibility exists that previously documented standard health maintenance information may have been brought forward from a previous encounter into this note.  If needed, that same information has been updated to reflect the current situation based on today's encounter.    Tetanus done 2021 Flu done yearly, d/w pt.   PNA due at 65. shingles d/w pt.   covid vaccine prev done.   PSA screening- done by uro, I'll defer, d/w pt.   Colonoscopy done prev, 2014. Was told 10 year f/u. Done per [name]. Living will d/w pt. Wife designated if patient were incapacitated. Diet and exercise d/w pt. Encouraged both.  HCV and HIV screening prev neg. Labs d/w pt.   D/w pt about pulmonary nodule hx with work related smoke exposure.  CT ordered.    Elevated Cholesterol: Using medications without problems: yes Muscle aches: likely not from statin.   Diet compliance: yes Exercise: yes  R knee pain, in spite of meloxicam.  R knee surgery his spring, recently getting some better.    Mood d/w pt.  Mood is okay.  Still working at [employer] center.  Compliant. No SI/HI.    Seizure hx per neuro.  No events.  Compliant.  Has neuro f/u pending.    PMH and SH reviewed  Meds, vitals, and allergies reviewed.   ROS: Per HPI.  Unless specifically indicated otherwise in HPI, the patient denies:  General: fever. Eyes: acute vision changes ENT: sore throat Cardiovascular: chest pain Respiratory: SOB GI: vomiting GU: dysuria Musculoskeletal: acute back pain Derm: acute rash Neuro: acute motor dysfunction Psych: worsening mood Endocrine: polydipsia Heme: bleeding Allergy: hayfever  GEN: nad, alert and oriented HEENT: mucous membranes moist NECK: supple w/o LA CV: rrr. PULM: ctab, no inc wob ABD: soft, +bs EXT: no edema SKIN: no acute rash

## 2021-09-20 NOTE — Assessment & Plan Note (Signed)
Per Ortho.  NSAID cautions discussed with patient.  Discussed trying meloxicam prior to work to see if that helps with pain during and after the shift.

## 2021-09-20 NOTE — Assessment & Plan Note (Signed)
Mood is okay.  Still working at [employer] center.  Compliant. No SI/HI.  Continue Lexapro.

## 2021-09-20 NOTE — Assessment & Plan Note (Signed)
Tetanus done 2021 Flu done yearly, d/w pt.   PNA due at 65. shingles d/w pt.   covid vaccine prev done.   PSA screening- done by uro, I'll defer, d/w pt.   Colonoscopy done prev, 2014. Was told 10 year f/u. Done per [name]. Living will d/w pt. Wife designated if patient were incapacitated. Diet and exercise d/w pt. Encouraged both.  HCV and HIV screening prev neg. Labs d/w pt.

## 2021-09-20 NOTE — Assessment & Plan Note (Signed)
No events.  Per neurology.  I will defer.  Compliant.

## 2021-09-20 NOTE — Assessment & Plan Note (Signed)
Continue atorvastatin

## 2021-09-20 NOTE — Assessment & Plan Note (Signed)
Living will d/w pt.  Wife designated if patient were incapacitated.   ?

## 2021-09-20 NOTE — Assessment & Plan Note (Signed)
D/w pt about pulmonary nodule hx with work related smoke exposure.  CT ordered.

## 2021-10-15 ENCOUNTER — Other Ambulatory Visit: Payer: Self-pay | Admitting: Adult Health

## 2021-10-16 ENCOUNTER — Other Ambulatory Visit: Payer: Self-pay | Admitting: Adult Health

## 2021-10-16 MED ORDER — LACOSAMIDE 150 MG PO TABS: 1.0000 | ORAL_TABLET | Freq: Two times a day (BID) | ORAL | 5 refills | Status: DC

## 2021-10-16 NOTE — Telephone Encounter (Signed)
Last visit 12/20/20 Next visit 12/26/21 Per [facility], last filled on 09/18/2021 #60/30. Rx refill sent to [facility].

## 2021-10-16 NOTE — Telephone Encounter (Signed)
Order sent.

## 2021-12-26 ENCOUNTER — Telehealth (INDEPENDENT_AMBULATORY_CARE_PROVIDER_SITE_OTHER): Payer: 59 | Admitting: Adult Health

## 2021-12-26 DIAGNOSIS — R569 Unspecified convulsions: Secondary | ICD-10-CM

## 2021-12-26 NOTE — Progress Notes (Signed)
PATIENT: Mark Hernandez. DOB: 06/14/1960  REASON FOR VISIT: follow up HISTORY FROM: patient  Virtual Visit via Video Note  I connected with Mark Hernandez. on 12/26/21 at  2:30 PM EST by a video enabled telemedicine application located remotely at [facility] and verified that I am speaking with the correct person using two identifiers who was located at their own home.   I discussed the limitations of evaluation and management by telemedicine and the availability of in person appointments. The patient expressed understanding and agreed to proceed.   PATIENT: Mark Hernandez. DOB: 06/10/1960  REASON FOR VISIT: follow up HISTORY FROM: patient  HISTORY OF PRESENT ILLNESS: Today 12/26/21: Mr. Suniga  is a 61 year old male with a history of seizures.  He returns today for follow-up.  He denies any seizure events.  Continues on Vimpat 150 mg twice a day.  Operates a motor vehicle without difficulty.  Able to complete ADLs independently.  Returns today for an evaluation.  12/20/20: Mr. Corneau is a 61 year old male with a history of seizures.  He returns today for follow-up.  He denies any seizure events.  He states that he is on the generic of Vimpat now.  He reports that he has tolerated that well.  Continues to operate a motor vehicle without difficulty.  Returns today for evaluation.  HISTORY 12/20/19: Mr. Minette is a 61 year old male with a history of seizures.  He returns today for follow-up.  He denies any seizure events.  Continues on Vimpat 150 mg twice a day.  He is able to complete all ADLs independently.  Operates a Teacher, music.  Returns today for follow-up.  REVIEW OF SYSTEMS: Out of a complete 14 system review of symptoms, the patient complains only of the following symptoms, and all other reviewed systems are negative.  ALLERGIES: Allergies  Allergen Reactions   Penicillins Rash    As a child     HOME MEDICATIONS: Outpatient Medications Prior to  Visit  Medication Sig Dispense Refill   atorvastatin (LIPITOR) 10 MG tablet Take 1 tablet (10 mg total) by mouth daily. 90 tablet 3   escitalopram (LEXAPRO) 10 MG tablet Take 1 tablet (10 mg total) by mouth daily. 90 tablet 3   Lacosamide 150 MG TABS Take 1 tablet (150 mg total) by mouth 2 (two) times daily. 60 tablet 5   meloxicam (MOBIC) 7.5 MG tablet Take 1 tablet by mouth 2 (two) times daily.     No facility-administered medications prior to visit.    PAST MEDICAL HISTORY: Past Medical History:  Diagnosis Date   Anxiety    Arthritis    BOTH HANDS   BPH (benign prostatic hypertrophy)    Elevated PSA    History of kidney stones    History of methicillin resistant staphylococcus aureus (MRSA) 2008   HLD (hyperlipidemia)    Hx MRSA infection 2009   Nocturia    Over weight    Pulmonary nodule    Rotator cuff tear    Seizures ([place]) 2017    PAST SURGICAL HISTORY: Past Surgical History:  Procedure Laterality Date   COLONOSCOPY  2014?   INGUINAL HERNIA REPAIR Left 02/17/2015   Procedure: LAPAROSCOPIC INGUINAL HERNIA;  Surgeon: Christene Lye, MD;  Location: [facility];  Service: General;  Laterality: Left;   KNEE ARTHROSCOPY WITH MEDIAL MENISECTOMY Right 01/13/2019   Procedure: KNEE ARTHROSCOPY WITH MEDIAL MENISECTOMY;  Surgeon: Thornton Park, MD;  Location: [facility];  Service:  Orthopedics;  Laterality: Right;   LITHOTRIPSY     x 2   SHOULDER ARTHROSCOPY WITH OPEN ROTATOR CUFF REPAIR Left 09/23/2014   Procedure: SHOULDER ARTHROSCOPY , debridement, decompression, WITH  mini OPEN ROTATOR CUFF REPAIR;  Surgeon: Corky Mull, MD;  Location: [facility];  Service: Orthopedics;  Laterality: Left;   SHOULDER SURGERY Bilateral    rotator cuff repair   TOTAL KNEE ARTHROPLASTY Right 05/09/2021   Procedure: TOTAL KNEE ARTHROPLASTY;  Surgeon: Thornton Park, MD;  Location: [facility];  Service: Orthopedics;  Laterality: Right;   UMBILICAL HERNIA REPAIR N/A 02/17/2015   Procedure:  LAPAROSCOPIC UMBILICAL HERNIA;  Surgeon: Christene Lye, MD;  Location: [facility];  Service: General;  Laterality: N/A;    FAMILY HISTORY: Family History  Problem Relation Age of Onset   COPD Mother    Lupus Mother    Prostate cancer Father    Colon cancer Paternal Grandmother    Kidney disease Paternal Uncle    Kidney cancer Paternal Uncle     SOCIAL HISTORY: Social History   Socioeconomic History   Marital status: Married    Spouse name: Vinnie Level   Number of children: 2   Years of education: 14   Highest education level: Not on file  Occupational History   Occupation: Arts administrator   Tobacco Use   Smoking status: Never   Smokeless tobacco: Former    Types: Chew    Quit date: 05/07/2004  Vaping Use   Vaping Use: Never used  Substance and Sexual Activity   Alcohol use: Yes    Comment: occ   Drug use: No   Sexual activity: Not on file  Other Topics Concern   Not on file  Social History Narrative   Married 1997, 2 children   Ambidextrous   Associate's degree   Airline pilot, 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 Follows all commands speech and language fluent Cranial nerve II-XII:Extraocular movements were full. Facial symmetry noted. Head turning and shoulder shrug  were normal and symmetric.  DIAGNOSTIC DATA (LABS, IMAGING, TESTING) - I reviewed patient records, labs, notes, testing and imaging myself where available.  Lab Results  Component Value Date   WBC 4.5 09/11/2021   HGB 13.2 09/11/2021   HCT 39.6  09/11/2021   MCV 88.2 09/11/2021   PLT 194.0 09/11/2021      Component Value Date/Time   NA 139 09/11/2021 0739   K 4.2 09/11/2021 0739   CL 105 09/11/2021 0739   CO2 25 09/11/2021 0739   GLUCOSE 96 09/11/2021 0739   BUN 18 09/11/2021 0739   CREATININE 0.79 09/11/2021 0739   CALCIUM 9.3 09/11/2021 0739   PROT 7.2 09/11/2021 0739   ALBUMIN 4.1 09/11/2021 0739   AST 16 09/11/2021 0739   ALT 12 09/11/2021 0739   ALKPHOS 68 09/11/2021 0739   BILITOT 0.4 09/11/2021 0739   GFRNONAA >60 05/10/2021 0452   GFRAA >60 01/09/2019 0810   Lab Results  Component Value Date   CHOL 148 09/11/2021  HDL 45.50 09/11/2021   LDLCALC 82 09/11/2021   TRIG 103.0 09/11/2021   CHOLHDL 3 09/11/2021      ASSESSMENT AND PLAN 61 y.o. year old male  has a past medical history of Anxiety, Arthritis, BPH (benign prostatic hypertrophy), Elevated PSA, History of kidney stones, History of methicillin resistant staphylococcus aureus (MRSA) (2008), HLD (hyperlipidemia), MRSA infection (2009), Nocturia, Over weight, Pulmonary nodule, Rotator cuff tear, and Seizures ([facility]) (2017). here with:  1.  Seizures  Continue lacosamide 150 mg twice a day Advised if he has any seizure events he should let us know Follow-up in 1 year or sooner if needed    Butch Penny, MSN, NP-C 12/26/2021, 2:21 PM [facility] [address] [place], [place] [address] [phone]

## 2021-12-29 ENCOUNTER — Encounter: Payer: Self-pay | Admitting: *Deleted

## 2022-01-11 ENCOUNTER — Other Ambulatory Visit: Payer: Self-pay | Admitting: Adult Health

## 2022-01-11 DIAGNOSIS — R569 Unspecified convulsions: Secondary | ICD-10-CM

## 2022-04-02 ENCOUNTER — Other Ambulatory Visit: Payer: Self-pay

## 2022-04-02 DIAGNOSIS — N138 Other obstructive and reflux uropathy: Secondary | ICD-10-CM

## 2022-04-03 ENCOUNTER — Other Ambulatory Visit: Payer: 59

## 2022-04-03 DIAGNOSIS — N138 Other obstructive and reflux uropathy: Secondary | ICD-10-CM

## 2022-04-04 LAB — PSA: Prostate Specific Ag, Serum: 0.5 ng/mL (ref 0.0–4.0)

## 2022-04-05 NOTE — Progress Notes (Signed)
04/06/22 9:28 AM   Mark Hernandez. 11-Oct-1960 [id number]  Referring provider:  Tonia Ghent, MD [address] [place],  [place] [address]  Chief Complaint  Patient presents with   Follow-up   Urological history  1. BPH with LU TS - PSA (03/2022) 0.5 - I PSS 11/1   2. Family history of prostate cancer - father and paternal father with prostate cancer   3. Nephrolithiasis - last incident in 2010  4.  Hydroceles -scrotal US (2022) - Small bilateral hydroceles   HPI: Mark Hernandez. is a 62 y.o.male who presents today for a 1 year follow-up.    I PSS 11/1  He has no urinary complaints at this time.  Patient denies any modifying or aggravating factors.  Patient denies any gross hematuria, dysuria or suprapubic/flank pain.  Patient denies any fevers, chills, nausea or vomiting.      IPSS     Row Name 04/06/22 0900         International Prostate Symptom Score   How often have you had the sensation of not emptying your bladder? Less than 1 in 5     How often have you had to urinate less than every two hours? Less than half the time     How often have you found you stopped and started again several times when you urinated? Less than half the time     How often have you found it difficult to postpone urination? Less than half the time     How often have you had a weak urinary stream? Less than 1 in 5 times     How often have you had to strain to start urination? Less than 1 in 5 times     How many times did you typically get up at night to urinate? 2 Times     Total IPSS Score 11       Quality of Life due to urinary symptoms   If you were to spend the rest of your life with your urinary condition just the way it is now how would you feel about that? Pleased               Score:  1-7 Mild 8-19 Moderate 20-35 Severe     PMH: Past Medical History:  Diagnosis Date   Anxiety    Arthritis    BOTH HANDS   BPH (benign prostatic hypertrophy)     Elevated PSA    History of kidney stones    History of methicillin resistant staphylococcus aureus (MRSA) 2008   HLD (hyperlipidemia)    Hx MRSA infection 2009   Nocturia    Over weight    Pulmonary nodule    Rotator cuff tear    Seizures ([place]) 2017    Surgical History: Past Surgical History:  Procedure Laterality Date   COLONOSCOPY  2014?   INGUINAL HERNIA REPAIR Left 02/17/2015   Procedure: LAPAROSCOPIC INGUINAL HERNIA;  Surgeon: Christene Lye, MD;  Location: [facility];  Service: General;  Laterality: Left;   KNEE ARTHROSCOPY WITH MEDIAL MENISECTOMY Right 01/13/2019   Procedure: KNEE ARTHROSCOPY WITH MEDIAL MENISECTOMY;  Surgeon: Thornton Park, MD;  Location: [facility];  Service: Orthopedics;  Laterality: Right;   LITHOTRIPSY     x 2   SHOULDER ARTHROSCOPY WITH OPEN ROTATOR CUFF REPAIR Left 09/23/2014   Procedure: SHOULDER ARTHROSCOPY , debridement, decompression, WITH  mini OPEN ROTATOR CUFF REPAIR;  Surgeon: Corky Mull, MD;  Location: [facility];  Service: Orthopedics;  Laterality: Left;   SHOULDER SURGERY Bilateral    rotator cuff repair   TOTAL KNEE ARTHROPLASTY Right 05/09/2021   Procedure: TOTAL KNEE ARTHROPLASTY;  Surgeon: Thornton Park, MD;  Location: [facility];  Service: Orthopedics;  Laterality: Right;   UMBILICAL HERNIA REPAIR N/A 02/17/2015   Procedure: LAPAROSCOPIC UMBILICAL HERNIA;  Surgeon: Christene Lye, MD;  Location: [facility];  Service: General;  Laterality: N/A;    Home Medications:  Allergies as of 04/06/2022       Reactions   Penicillins Rash   As a child        Medication List        Accurate as of April 06, 2022  9:28 AM. If you have any questions, ask your nurse or doctor.          STOP taking these medications    meloxicam 7.5 MG tablet Commonly known as: MOBIC       TAKE these medications    atorvastatin 10 MG tablet Commonly known as: LIPITOR Take 1 tablet (10 mg total) by mouth daily.   escitalopram 10 MG  tablet Commonly known as: LEXAPRO Take 1 tablet (10 mg total) by mouth daily.   Lacosamide 150 MG Tabs TAKE 1 TABLET BY MOUTH TWICE A DAY        Allergies:  Allergies  Allergen Reactions   Penicillins Rash    As a child     Family History: Family History  Problem Relation Age of Onset   COPD Mother    Lupus Mother    Prostate cancer Father    Colon cancer Paternal Grandmother    Kidney disease Paternal Uncle    Kidney cancer Paternal Uncle     Social History:  reports that he has never smoked. He quit smokeless tobacco use about 17 years ago.  His smokeless tobacco use included chew. He reports current alcohol use. He reports that he does not use drugs.   Physical Exam: BP (!) 145/84   Pulse 75   Ht 5\' 9"  (1.753 m)   Wt 218 lb (98.9 kg)   BMI 32.19 kg/m   Constitutional:  Well nourished. Alert and oriented, No acute distress. HEENT: [place] AT, moist mucus membranes.  Trachea midline Cardiovascular: No clubbing, cyanosis, or edema. Respiratory: Normal respiratory effort, no increased work of breathing. GU: No CVA tenderness.  No bladder fullness or masses.  Patient with circumcised phallus.  Urethral meatus is patent.  No penile discharge. No penile lesions or rashes. Scrotum without lesions, cysts, rashes and/or edema.  Testicles are located scrotally bilaterally. No masses are appreciated in the testicles. Left and right epididymis are normal. Neurologic: Grossly intact, no focal deficits, moving all 4 extremities. Psychiatric: Normal mood and affect.   Laboratory Data:    Latest Ref Rng & Units 09/11/2021    7:39 AM 05/10/2021    4:52 AM 04/18/2021    8:12 AM  CBC  WBC 4.0 - 10.5 K/uL 4.5  13.4  5.4   Hemoglobin 13.0 - 17.0 g/dL 13.2  10.5  13.6   Hematocrit 39.0 - 52.0 % 39.6  32.0  41.0   Platelets 150.0 - 400.0 K/uL 194.0  174  208.0        Latest Ref Rng & Units 09/11/2021    7:39 AM 05/10/2021    4:52 AM 04/18/2021    8:12 AM  CMP  Glucose 70 - 99 mg/dL  96  122  90  BUN 6 - 23 mg/dL 18  21  23    Creatinine 0.40 - 1.50 mg/dL 0.79  0.78  0.88   Sodium 135 - 145 mEq/L 139  137  138   Potassium 3.5 - 5.1 mEq/L 4.2  4.2  4.8   Chloride 96 - 112 mEq/L 105  105  103   CO2 19 - 32 mEq/L 25  26  29    Calcium 8.4 - 10.5 mg/dL 9.3  8.7  9.7   Total Protein 6.0 - 8.3 g/dL 7.2   7.1    7.1   Total Bilirubin 0.2 - 1.2 mg/dL 0.4   0.6    0.6   Alkaline Phos 39 - 117 U/L 68   75    75   AST 0 - 37 U/L 16   18    18    ALT 0 - 53 U/L 12   15    15    I have reviewed the labs.   Pertinent Imaging N/A  Assessment & Plan:    1. BPH with LUTS -Continue conservative management, avoiding bladder irritants and timed voiding's   2. Family history of prostate cancer: - Patient's father and paternal grandfather had been diagnosed with prostate cancer.   - PSA has remained stable  - Rectal exam was reassuring today  - Patient will continue to have yearly prostate exams and PSA's  Return in about 1 year (around 04/06/2023) for IPSS, PSA and exam.  Zara Council, PA-C  [facility] [address] [place], [place] [address] [phone]

## 2022-04-06 ENCOUNTER — Encounter: Payer: Self-pay | Admitting: Urology

## 2022-04-06 ENCOUNTER — Ambulatory Visit: Payer: 59 | Admitting: Urology

## 2022-04-06 VITALS — BP 145/84 | HR 75 | Ht 69.0 in | Wt 218.0 lb

## 2022-04-06 DIAGNOSIS — Z87438 Personal history of other diseases of male genital organs: Secondary | ICD-10-CM

## 2022-04-06 DIAGNOSIS — N138 Other obstructive and reflux uropathy: Secondary | ICD-10-CM

## 2022-04-06 DIAGNOSIS — N401 Enlarged prostate with lower urinary tract symptoms: Secondary | ICD-10-CM | POA: Diagnosis not present

## 2022-04-06 DIAGNOSIS — N433 Hydrocele, unspecified: Secondary | ICD-10-CM

## 2022-04-06 DIAGNOSIS — Z8042 Family history of malignant neoplasm of prostate: Secondary | ICD-10-CM

## 2022-04-13 ENCOUNTER — Other Ambulatory Visit: Payer: Self-pay | Admitting: Adult Health

## 2022-04-13 DIAGNOSIS — R569 Unspecified convulsions: Secondary | ICD-10-CM

## 2022-04-17 NOTE — Telephone Encounter (Signed)
Last visit 12/26/21 Next visit 12/18/22  Per [place] registry:

## 2022-04-17 NOTE — Telephone Encounter (Signed)
Pt called wanting to know when his medication will be approved to be filled today. Please advise.

## 2022-04-18 NOTE — Telephone Encounter (Signed)
Called and informed the pt that the medication was sent in along with 5 months refills. Pt voiced understanding

## 2022-08-29 ENCOUNTER — Encounter (INDEPENDENT_AMBULATORY_CARE_PROVIDER_SITE_OTHER): Payer: Self-pay

## 2022-09-03 ENCOUNTER — Telehealth: Payer: Self-pay

## 2022-09-03 NOTE — Transitions of Care (Post Inpatient/ED Visit) (Signed)
Unable to reach pt by phone and left v/m requesting cb [phone].        09/03/2022  Name: Mark Hernandez. MRN: [id number] DOB: February 04, 1960  Today's TOC FU Call Status: Today's TOC FU Call Status:: Unsuccessful Call (1st Attempt) Unsuccessful Call (1st Attempt) Date: 09/03/22  Attempted to reach the patient regarding the most recent Inpatient/ED visit.  Follow Up Plan: Additional outreach attempts will be made to reach the patient to complete the Transitions of Care (Post Inpatient/ED visit) call.   Signature  Lewanda Rife, LPN

## 2022-09-13 ENCOUNTER — Other Ambulatory Visit: Payer: Self-pay | Admitting: Family Medicine

## 2022-09-13 ENCOUNTER — Other Ambulatory Visit (INDEPENDENT_AMBULATORY_CARE_PROVIDER_SITE_OTHER): Payer: 59

## 2022-09-13 DIAGNOSIS — E785 Hyperlipidemia, unspecified: Secondary | ICD-10-CM | POA: Diagnosis not present

## 2022-09-13 LAB — CBC WITH DIFFERENTIAL/PLATELET
Basophils Absolute: 0 10*3/uL (ref 0.0–0.1)
Basophils Relative: 0.4 % (ref 0.0–3.0)
Eosinophils Absolute: 0.2 10*3/uL (ref 0.0–0.7)
Eosinophils Relative: 2.9 % (ref 0.0–5.0)
HCT: 40.7 % (ref 39.0–52.0)
Hemoglobin: 13.2 g/dL (ref 13.0–17.0)
Lymphocytes Relative: 21.1 % (ref 12.0–46.0)
Lymphs Abs: 1.1 10*3/uL (ref 0.7–4.0)
MCHC: 32.4 g/dL (ref 30.0–36.0)
MCV: 90.3 fl (ref 78.0–100.0)
Monocytes Absolute: 0.6 10*3/uL (ref 0.1–1.0)
Monocytes Relative: 11.1 % (ref 3.0–12.0)
Neutro Abs: 3.5 10*3/uL (ref 1.4–7.7)
Neutrophils Relative %: 64.5 % (ref 43.0–77.0)
Platelets: 213 10*3/uL (ref 150.0–400.0)
RBC: 4.5 Mil/uL (ref 4.22–5.81)
RDW: 13.4 % (ref 11.5–15.5)
WBC: 5.4 10*3/uL (ref 4.0–10.5)

## 2022-09-13 LAB — COMPREHENSIVE METABOLIC PANEL
ALT: 12 U/L (ref 0–53)
AST: 18 U/L (ref 0–37)
Albumin: 4.1 g/dL (ref 3.5–5.2)
Alkaline Phosphatase: 72 U/L (ref 39–117)
BUN: 19 mg/dL (ref 6–23)
CO2: 28 mEq/L (ref 19–32)
Calcium: 9.7 mg/dL (ref 8.4–10.5)
Chloride: 104 mEq/L (ref 96–112)
Creatinine, Ser: 0.84 mg/dL (ref 0.40–1.50)
GFR: 93.92 mL/min (ref >60.00)
Glucose, Bld: 95 mg/dL (ref 70–99)
Potassium: 4.3 mEq/L (ref 3.5–5.1)
Sodium: 138 mEq/L (ref 135–145)
Total Bilirubin: 0.6 mg/dL (ref 0.2–1.2)
Total Protein: 7.3 g/dL (ref 6.0–8.3)

## 2022-09-13 LAB — LIPID PANEL
Cholesterol: 142 mg/dL (ref 0–200)
HDL: 42.8 mg/dL (ref >39.00)
LDL Cholesterol: 79 mg/dL (ref 0–99)
NonHDL: 99.57
Total CHOL/HDL Ratio: 3
Triglycerides: 104 mg/dL (ref 0.0–149.0)
VLDL: 20.8 mg/dL (ref 0.0–40.0)

## 2022-09-20 ENCOUNTER — Ambulatory Visit (INDEPENDENT_AMBULATORY_CARE_PROVIDER_SITE_OTHER): Payer: 59 | Admitting: Family Medicine

## 2022-09-20 ENCOUNTER — Encounter: Payer: Self-pay | Admitting: Family Medicine

## 2022-09-20 VITALS — BP 132/82 | HR 72 | Temp 98.2°F | Ht 69.0 in | Wt 222.0 lb

## 2022-09-20 DIAGNOSIS — M25561 Pain in right knee: Secondary | ICD-10-CM

## 2022-09-20 DIAGNOSIS — R911 Solitary pulmonary nodule: Secondary | ICD-10-CM

## 2022-09-20 DIAGNOSIS — Z1211 Encounter for screening for malignant neoplasm of colon: Secondary | ICD-10-CM

## 2022-09-20 DIAGNOSIS — R569 Unspecified convulsions: Secondary | ICD-10-CM

## 2022-09-20 DIAGNOSIS — Z7189 Other specified counseling: Secondary | ICD-10-CM

## 2022-09-20 DIAGNOSIS — F419 Anxiety disorder, unspecified: Secondary | ICD-10-CM

## 2022-09-20 DIAGNOSIS — Z0000 Encounter for general adult medical examination without abnormal findings: Secondary | ICD-10-CM

## 2022-09-20 DIAGNOSIS — E785 Hyperlipidemia, unspecified: Secondary | ICD-10-CM

## 2022-09-20 MED ORDER — ATORVASTATIN CALCIUM 10 MG PO TABS: 10.0000 mg | ORAL_TABLET | Freq: Every day | ORAL | 3 refills | Status: DC

## 2022-09-20 MED ORDER — ESCITALOPRAM OXALATE 10 MG PO TABS: 10.0000 mg | ORAL_TABLET | Freq: Every day | ORAL | 3 refills | Status: DC

## 2022-09-20 NOTE — Patient Instructions (Addendum)
If you can't get help from ortho, then please let me know.    Please ask the front for a record release from from [facility] for your ER visit.   [address] [place] , [place] [address] [phone]  If the ER didn't scan your chest, then it would be reasonable to get the follow up chest CT done.    Check with your insurance to see if they will cover the shingles shot. I would get a flu shot each fall.   Take care.  Glad to see you.

## 2022-09-20 NOTE — Progress Notes (Signed)
CPE- See plan.  Routine anticipatory guidance given to patient.  See health maintenance.  The possibility exists that previously documented standard health maintenance information may have been brought forward from a previous encounter into this note.  If needed, that same information has been updated to reflect the current situation based on today's encounter.    Tetanus done 2021 Flu done yearly, d/w pt.   PNA due at 65. shingles d/w pt.   covid vaccine prev done.   PSA screening- done by uro, I'll defer, d/w pt.   Colonoscopy done prev, 2014. Done per [facility]. Referred 2024 Living will d/w pt. Wife designated if patient were incapacitated. Diet and exercise d/w pt. Encouraged both.  HCV and HIV screening prev neg. Labs d/w pt.   He has chronic pigmentation changes on the R medial shin.  Prev medial leg injury.  No ulceration.  No claudication.  No BLE edema.    D/w pt about pulmonary nodule hx with work related smoke exposure.  CT ordered.     Elevated Cholesterol: Using medications without problems: yes Muscle aches: likely not from statin.   Diet compliance: yes Exercise: yes   R knee pain, with prev replacement.  Pain with extension.  Clicking on ROM.  D/w pt about f/u with ortho.     Mood d/w pt.  Mood is okay.  Still working at [employer] center.  Compliant. No SI/HI.     Seizure hx per neuro.  No events.  Compliant.  Has neuro f/u pending.     Recent ER eval out of town.  I don't have records from that.  Eval for abd pain, it resolved in the meantime.  He had neg eval per patient report.  Requesting records.  It was upper midline abd pain.  No FCNAVD.  No blood in stool.  See after visit summary.  He can update me as needed.  PMH and SH reviewed  Meds, vitals, and allergies reviewed.   ROS: Per HPI.  Unless specifically indicated otherwise in HPI, the patient denies:  General: fever. Eyes: acute vision changes ENT: sore throat Cardiovascular: chest  pain Respiratory: SOB GI: vomiting GU: dysuria Musculoskeletal: acute back pain Derm: acute rash Neuro: acute motor dysfunction Psych: worsening mood Endocrine: polydipsia Heme: bleeding Allergy: hayfever  GEN: nad, alert and oriented HEENT: ncat NECK: supple w/o LA CV: rrr. PULM: ctab, no inc wob ABD: soft, +bs EXT: no edema SKIN: no acute rash

## 2022-09-23 NOTE — Assessment & Plan Note (Signed)
Mood is okay.  Still working at [employer] center.  Compliant. No SI/HI.   Continue as is with Lexapro.

## 2022-09-23 NOTE — Assessment & Plan Note (Signed)
Living will d/w pt.  Wife designated if patient were incapacitated.   ?

## 2022-09-23 NOTE — Assessment & Plan Note (Signed)
Continue atorvastatin.  Labs discussed with patient.

## 2022-09-23 NOTE — Assessment & Plan Note (Signed)
Seizure hx per neuro.  No events.  Compliant.  Has neuro f/u pending.

## 2022-09-23 NOTE — Assessment & Plan Note (Signed)
Tetanus done 2021 Flu done yearly, d/w pt.   PNA due at 65. shingles d/w pt.   covid vaccine prev done.   PSA screening- done by uro, I'll defer, d/w pt.   Colonoscopy done prev, 2014. Done per [name]. Referred 2024 Living will d/w pt. Wife designated if patient were incapacitated. Diet and exercise d/w pt. Encouraged both.  HCV and HIV screening prev neg. Labs d/w pt.

## 2022-09-23 NOTE — Assessment & Plan Note (Signed)
Discussed with patient about Ortho follow-up.

## 2022-09-23 NOTE — Assessment & Plan Note (Signed)
D/w pt about pulmonary nodule hx with work related smoke exposure.  CT ordered.

## 2022-10-12 ENCOUNTER — Other Ambulatory Visit: Payer: Self-pay | Admitting: Adult Health

## 2022-10-12 DIAGNOSIS — R569 Unspecified convulsions: Secondary | ICD-10-CM

## 2022-10-15 ENCOUNTER — Other Ambulatory Visit: Payer: Self-pay | Admitting: Adult Health

## 2022-10-15 DIAGNOSIS — R569 Unspecified convulsions: Secondary | ICD-10-CM

## 2022-10-15 MED ORDER — LACOSAMIDE 150 MG PO TABS: 1.0000 | ORAL_TABLET | Freq: Two times a day (BID) | ORAL | 5 refills | Status: DC

## 2022-10-15 NOTE — Progress Notes (Signed)
Patient called in as he has not received a refill on his Vimpat.  This was sent over on Friday but we were out of the office.  Informed the patient from here on out he can contact us the week before his medication is due through MyChart and we will make sure he gets his refill on time.

## 2022-10-15 NOTE — Telephone Encounter (Signed)
Pt called wanting to know when this will be called in for him.

## 2022-10-15 NOTE — Telephone Encounter (Signed)
Pt is wanting to make sure that the  Lacosamide 150 MG TABS,is called into the [facility].  Pt asked it be noted that as of this morning he is out of the medication.

## 2022-10-25 ENCOUNTER — Other Ambulatory Visit: Payer: Self-pay | Admitting: Orthopedic Surgery

## 2022-10-25 DIAGNOSIS — M25861 Other specified joint disorders, right knee: Secondary | ICD-10-CM

## 2022-11-13 ENCOUNTER — Ambulatory Visit
Admission: RE | Admit: 2022-11-13 | Discharge: 2022-11-13 | Disposition: A | Discharge status: Home / Self Care | Payer: 59 | Source: Ambulatory Visit | Attending: Orthopedic Surgery

## 2022-11-13 DIAGNOSIS — M25861 Other specified joint disorders, right knee: Secondary | ICD-10-CM

## 2022-12-14 ENCOUNTER — Other Ambulatory Visit
Admission: RE | Admit: 2022-12-14 | Discharge: 2022-12-14 | Disposition: A | Discharge status: Home / Self Care | Payer: 59 | Source: Ambulatory Visit | Attending: Orthopedic Surgery | Admitting: Orthopedic Surgery

## 2022-12-14 DIAGNOSIS — M25861 Other specified joint disorders, right knee: Secondary | ICD-10-CM | POA: Diagnosis present

## 2022-12-14 LAB — SYNOVIAL CELL COUNT + DIFF, W/ CRYSTALS
Crystals, Fluid: NONE SEEN
Eosinophils-Synovial: 0 %
Lymphocytes-Synovial Fld: 11 %
Monocyte-Macrophage-Synovial Fluid: 0 %
Neutrophil, Synovial: 89 %
WBC, Synovial: 1823 /mm3 — ABNORMAL HIGH (ref 0–200)

## 2022-12-17 ENCOUNTER — Telehealth: Payer: Self-pay

## 2022-12-17 NOTE — Telephone Encounter (Signed)
Received form from [facility] for surgical clearance.  Placed in your box for review.  Last visit 09/20/22 for cpe  EKG has been over a year.   Do you need a appointment for patient.

## 2022-12-18 ENCOUNTER — Other Ambulatory Visit: Payer: Self-pay | Admitting: Family Medicine

## 2022-12-18 ENCOUNTER — Telehealth: Payer: 59 | Admitting: Adult Health

## 2022-12-18 DIAGNOSIS — R569 Unspecified convulsions: Secondary | ICD-10-CM

## 2022-12-18 DIAGNOSIS — M5416 Radiculopathy, lumbar region: Secondary | ICD-10-CM

## 2022-12-18 NOTE — Telephone Encounter (Signed)
Form done, okay to proceed.  Thanks.  Please scan and send form.

## 2022-12-18 NOTE — Progress Notes (Signed)
PATIENT: Mark Hernandez. DOB: 08-31-1960  REASON FOR VISIT: follow up HISTORY FROM: patient  Virtual Visit via Video Note  I connected with Mark Hernandez. on 12/18/22 at  3:00 PM EST by a video enabled telemedicine application located remotely at [facility] and verified that I am speaking with the correct person using two identifiers who was located at their own home.   I discussed the limitations of evaluation and management by telemedicine and the availability of in person appointments. The patient expressed understanding and agreed to proceed.   PATIENT: Mark Hernandez. DOB: December 07, 1960  REASON FOR VISIT: follow up HISTORY FROM: patient  HISTORY OF PRESENT ILLNESS: Today 12/18/22:  Mark Hernandez. is a 62 y.o. male with a history of seizures. Returns today for follow-up.  Denies any seizure events.  Remains on Vimpat 150 mg twice a day.  Able to complete all ADLs independently.  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  Returns today for an evaluation.  12/26/21: Mark Hernandez  is a 63 year old male with a history of seizures.  He returns today for follow-up.  He denies any seizure events.  Continues on Vimpat 150 mg twice a day.  Operates a motor vehicle without difficulty.  Able to complete ADLs independently.  Returns today for an evaluation.  12/20/20: Mark Hernandez is a 62 year old male with a history of seizures.  He returns today for follow-up.  He denies any seizure events.  He states that he is on the generic of Vimpat now.  He reports that he has tolerated that well.  Continues to operate a motor vehicle without difficulty.  Returns today for evaluation.  HISTORY 12/20/19: Mark Hernandez is a 62 year old male with a history of seizures.  He returns today for follow-up.  He denies any seizure events.  Continues on Vimpat 150 mg twice a day.  He is able to complete all ADLs independently.  Operates a Librarian, academic.  Returns today for follow-up.  REVIEW OF  SYSTEMS: Out of a complete 14 system review of symptoms, the patient complains only of the following symptoms, and all other reviewed systems are negative.  ALLERGIES: Allergies  Allergen Reactions   Penicillins Rash    As a child     HOME MEDICATIONS: Outpatient Medications Prior to Visit  Medication Sig Dispense Refill   atorvastatin (LIPITOR) 10 MG tablet Take 1 tablet (10 mg total) by mouth daily. 90 tablet 3   escitalopram (LEXAPRO) 10 MG tablet Take 1 tablet (10 mg total) by mouth daily. 90 tablet 3   Lacosamide 150 MG TABS Take 1 tablet (150 mg total) by mouth 2 (two) times daily. 60 tablet 5   No facility-administered medications prior to visit.    PAST MEDICAL HISTORY: Past Medical History:  Diagnosis Date   Anxiety    Arthritis    BOTH HANDS   BPH (benign prostatic hypertrophy)    Elevated PSA    History of kidney stones    History of methicillin resistant staphylococcus aureus (MRSA) 2008   HLD (hyperlipidemia)    Hx MRSA infection 2009   Nocturia    Over weight    Pulmonary nodule    Rotator cuff tear    Seizures (HCC) 2017    PAST SURGICAL HISTORY: Past Surgical History:  Procedure Laterality Date   COLONOSCOPY  2014?   INGUINAL HERNIA REPAIR Left 02/17/2015   Procedure: LAPAROSCOPIC INGUINAL HERNIA;  Surgeon: Janet Berlin  Evette Cristal, MD;  Location: [facility];  Service: General;  Laterality: Left;   KNEE ARTHROSCOPY WITH MEDIAL MENISECTOMY Right 01/13/2019   Procedure: KNEE ARTHROSCOPY WITH MEDIAL MENISECTOMY;  Surgeon: Juanell Fairly, MD;  Location: [facility];  Service: Orthopedics;  Laterality: Right;   LITHOTRIPSY     x 2   SHOULDER ARTHROSCOPY WITH OPEN ROTATOR CUFF REPAIR Left 09/23/2014   Procedure: SHOULDER ARTHROSCOPY , debridement, decompression, WITH  mini OPEN ROTATOR CUFF REPAIR;  Surgeon: Christena Flake, MD;  Location: [facility];  Service: Orthopedics;  Laterality: Left;   SHOULDER SURGERY Bilateral    rotator cuff repair   TOTAL KNEE  ARTHROPLASTY Right 05/09/2021   Procedure: TOTAL KNEE ARTHROPLASTY;  Surgeon: Juanell Fairly, MD;  Location: [facility];  Service: Orthopedics;  Laterality: Right;   UMBILICAL HERNIA REPAIR N/A 02/17/2015   Procedure: LAPAROSCOPIC UMBILICAL HERNIA;  Surgeon: Kieth Brightly, MD;  Location: [facility];  Service: General;  Laterality: N/A;    FAMILY HISTORY: Family History  Problem Relation Age of Onset   COPD Mother    Lupus Mother    Prostate cancer Father    Colon cancer Paternal Grandmother    Kidney disease Paternal Uncle    Kidney cancer Paternal Uncle     SOCIAL HISTORY: Social History   Socioeconomic History   Marital status: Married    Spouse name: Rosalita Chessman   Number of children: 2   Years of education: 14   Highest education level: Not on file  Occupational History   Occupation: Engineer, structural   Tobacco Use   Smoking status: Never   Smokeless tobacco: Former    Types: Chew    Quit date: 05/07/2004  Vaping Use   Vaping status: Never Used  Substance and Sexual Activity   Alcohol use: Yes    Comment: occ   Drug use: No   Sexual activity: Not on file  Other Topics Concern   Not on file  Social History Narrative   Married 1997, 2 children   Ambidextrous   Associate's degree   IT sales professional, 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 Follows all commands speech and language fluent Cranial nerve II-XII: Facial symmetry noted  DIAGNOSTIC DATA (LABS, IMAGING, TESTING) - I reviewed patient records, labs, notes, testing  and imaging myself where available.  Lab Results  Component Value Date   WBC 5.4 09/13/2022   HGB 13.2 09/13/2022   HCT 40.7 09/13/2022   MCV 90.3 09/13/2022   PLT 213.0 09/13/2022      Component Value Date/Time   NA 138 09/13/2022 0747   K 4.3 09/13/2022 0747   CL 104 09/13/2022 0747   CO2 28 09/13/2022 0747   GLUCOSE 95 09/13/2022 0747   BUN 19 09/13/2022 0747   CREATININE 0.84 09/13/2022 0747   CALCIUM 9.7 09/13/2022 0747   PROT 7.3 09/13/2022 0747   ALBUMIN 4.1 09/13/2022 0747   AST 18 09/13/2022 0747   ALT 12 09/13/2022 0747   ALKPHOS 72 09/13/2022 0747   BILITOT 0.6 09/13/2022  0747   GFRNONAA >60 05/10/2021 0452   GFRAA >60 01/09/2019 0810   Lab Results  Component Value Date   CHOL 142 09/13/2022   HDL 42.80 09/13/2022   LDLCALC 79 09/13/2022   TRIG 104.0 09/13/2022   CHOLHDL 3 09/13/2022      ASSESSMENT AND PLAN 62 y.o. year old male  has a past medical history of Anxiety, Arthritis, BPH (benign prostatic hypertrophy), Elevated PSA, History of kidney stones, History of methicillin resistant staphylococcus aureus (MRSA) (2008), HLD (hyperlipidemia), MRSA infection (2009), Nocturia, Over weight, Pulmonary nodule, Rotator cuff tear, and Seizures (HCC) (2017). here with:  1.  Seizures  Continue lacosamide 150 mg twice a day Advised if he has any seizure events he should let us know Follow-up in 1 year or sooner if needed    Butch Penny, MSN, NP-C 12/18/2022, 3:13 PM [facility] [address], [address] [place], [place] [address] [phone]

## 2022-12-30 ENCOUNTER — Ambulatory Visit
Admission: RE | Admit: 2022-12-30 | Discharge: 2022-12-30 | Disposition: A | Discharge status: Home / Self Care | Payer: 59 | Source: Ambulatory Visit | Attending: Family Medicine | Admitting: Family Medicine

## 2022-12-30 DIAGNOSIS — M5416 Radiculopathy, lumbar region: Secondary | ICD-10-CM

## 2023-01-29 ENCOUNTER — Other Ambulatory Visit: Payer: Self-pay | Admitting: Orthopedic Surgery

## 2023-02-07 ENCOUNTER — Encounter
Admission: RE | Admit: 2023-02-07 | Discharge: 2023-02-07 | Disposition: A | Discharge status: Home / Self Care | Payer: 59 | Source: Ambulatory Visit | Attending: Orthopedic Surgery | Admitting: Orthopedic Surgery

## 2023-02-07 HISTORY — DX: Other specified joint disorders, right knee: M25.861

## 2023-02-07 NOTE — Patient Instructions (Addendum)
 Your procedure is scheduled on:02-14-23 Thursday Report to the Registration Desk on the 1st floor of the [facility].Then proceed to the 2nd floor Surgery Desk To find out your arrival time, please call [phone] between 1PM - 3PM on:02-13-23 Wednesday If your arrival time is 6:00 am, do not arrive before that time as the Medical Mall entrance doors do not open until 6:00 am.  REMEMBER: Instructions that are not followed completely may result in serious medical risk, up to and including death; or upon the discretion of your surgeon and anesthesiologist your surgery may need to be rescheduled.  Do not eat food after midnight the night before surgery.  No gum chewing or hard candies.  You may however, drink CLEAR liquids up to 2 hours before you are scheduled to arrive for your surgery. Do not drink anything within 2 hours of your scheduled arrival time.  Clear liquids include: - water  - apple juice without pulp - gatorade (not RED colors) - black coffee or tea (Do NOT add milk or creamers to the coffee or tea) Do NOT drink anything that is not on this list.  In addition, your doctor has ordered for you to drink the provided:  Ensure Pre-Surgery Clear Carbohydrate Drink  Drinking this carbohydrate drink up to two hours before surgery helps to reduce insulin resistance and improve patient outcomes. Please complete drinking 2 hours before scheduled arrival time.  One week prior to surgery:Stop NOW (02-07-23) Stop Anti-inflammatories (NSAIDS) such as Advil , Aleve, Ibuprofen , Motrin , Naproxen, Naprosyn and Aspirin  based products such as Excedrin, Goody's Powder, BC Powder. Stop ANY OVER THE COUNTER supplements until after surgery (Comfort Guard)  You may however, continue to take Tylenol  if needed for pain up until the day of surgery.  Continue taking all of your other prescription medications up until the day of surgery.  ON THE DAY OF SURGERY ONLY TAKE THESE MEDICATIONS WITH SIPS OF  WATER: -escitalopram  (LEXAPRO )  -Lacosamide    No Alcohol for 24 hours before or after surgery.  No Smoking including e-cigarettes for 24 hours before surgery.  No chewable tobacco products for at least 6 hours before surgery.  No nicotine patches on the day of surgery.  Do not use any recreational drugs for at least a week (preferably 2 weeks) before your surgery.  Please be advised that the combination of cocaine and anesthesia may have negative outcomes, up to and including death. If you test positive for cocaine, your surgery will be cancelled.  On the morning of surgery brush your teeth with toothpaste and water, you may rinse your mouth with mouthwash if you wish. Do not swallow any toothpaste or mouthwash.  Use CHG Soap as directed on instruction sheet.  Do not wear jewelry, make-up, hairpins, clips or nail polish.  For welded (permanent) jewelry: bracelets, anklets, waist bands, etc.  Please have this removed prior to surgery.  If it is not removed, there is a chance that hospital personnel will need to cut it off on the day of surgery.  Do not wear lotions, powders, or perfumes.   Do not shave body hair from the neck down 48 hours before surgery.  Contact lenses, hearing aids and dentures may not be worn into surgery.  Do not bring valuables to the hospital. [facility] is not responsible for any missing/lost belongings or valuables. .   Notify your doctor if there is any change in your medical condition (cold, fever, infection).  Wear comfortable clothing (specific to your surgery  type) to the hospital.  After surgery, you can help prevent lung complications by doing breathing exercises.  Take deep breaths and cough every 1-2 hours. Your doctor may order a device called an Incentive Spirometer to help you take deep breaths. When coughing or sneezing, hold a pillow firmly against your incision with both hands. This is called "splinting." Doing this helps protect your  incision. It also decreases belly discomfort.  If you are being admitted to the hospital overnight, leave your suitcase in the car. After surgery it may be brought to your room.  In case of increased patient census, it may be necessary for you, the patient, to continue your postoperative care in the Same Day Surgery department.  If you are being discharged the day of surgery, you will not be allowed to drive home. You will need a responsible individual to drive you home and stay with you for 24 hours after surgery.   If you are taking public transportation, you will need to have a responsible individual with you.  Please call the Pre-admissions Testing Dept. at [phone] if you have any questions about these instructions.  Surgery Visitation Policy:  Patients having surgery or a procedure may have two visitors.  Children under the age of 75 must have an adult with them who is not the patient.     Preparing for Surgery with CHLORHEXIDINE  GLUCONATE (CHG) Soap  Chlorhexidine  Gluconate (CHG) Soap  o An antiseptic cleaner that kills germs and bonds with the skin to continue killing germs even after washing  o Used for showering the night before surgery and morning of surgery  Before surgery, you can play an important role by reducing the number of germs on your skin.  CHG (Chlorhexidine  gluconate) soap is an antiseptic cleanser which kills germs and bonds with the skin to continue killing germs even after washing.  Please do not use if you have an allergy to CHG or antibacterial soaps. If your skin becomes reddened/irritated stop using the CHG.  1. Shower the NIGHT BEFORE SURGERY and the MORNING OF SURGERY with CHG soap.  2. If you choose to wash your hair, wash your hair first as usual with your normal shampoo.  3. After shampooing, rinse your hair and body thoroughly to remove the shampoo.  4. Use CHG as you would any other liquid soap. You can apply CHG directly to the skin  and wash gently with a scrungie or a clean washcloth.  5. Apply the CHG soap to your body only from the neck down. Do not use on open wounds or open sores. Avoid contact with your eyes, ears, mouth, and genitals (private parts). Wash face and genitals (private parts) with your normal soap.  6. Wash thoroughly, paying special attention to the area where your surgery will be performed.  7. Thoroughly rinse your body with warm water.  8. Do not shower/wash with your normal soap after using and rinsing off the CHG soap.  9. Pat yourself dry with a clean towel.  10. Wear clean pajamas to bed the night before surgery.  12. Place clean sheets on your bed the night of your first shower and do not sleep with pets.  13. Shower again with the CHG soap on the day of surgery prior to arriving at the hospital.  14. Do not apply any deodorants/lotions/powders.  15. Please wear clean clothes to the hospital.  How to Use an Incentive Spirometer An incentive spirometer is a tool that measures how  well you are filling your lungs with each breath. Learning to take long, deep breaths using this tool can help you keep your lungs clear and active. This may help to reverse or lessen your chance of developing breathing (pulmonary) problems, especially infection. You may be asked to use a spirometer: After a surgery. If you have a lung problem or a history of smoking. After a long period of time when you have been unable to move or be active. If the spirometer includes an indicator to show the highest number that you have reached, your health care provider or respiratory therapist will help you set a goal. Keep a log of your progress as told by your health care provider. What are the risks? Breathing too quickly may cause dizziness or cause you to pass out. Take your time so you do not get dizzy or light-headed. If you are in pain, you may need to take pain medicine before doing incentive spirometry. It is  harder to take a deep breath if you are having pain. How to use your incentive spirometer  Sit up on the edge of your bed or on a chair. Hold the incentive spirometer so that it is in an upright position. Before you use the spirometer, breathe out normally. Place the mouthpiece in your mouth. Make sure your lips are closed tightly around it. Breathe in slowly and as deeply as you can through your mouth, causing the piston or the ball to rise toward the top of the chamber. Hold your breath for 3-5 seconds, or for as long as possible. If the spirometer includes a coach indicator, use this to guide you in breathing. Slow down your breathing if the indicator goes above the marked areas. Remove the mouthpiece from your mouth and breathe out normally. The piston or ball will return to the bottom of the chamber. Rest for a few seconds, then repeat the steps 10 or more times. Take your time and take a few normal breaths between deep breaths so that you do not get dizzy or light-headed. Do this every 1-2 hours when you are awake. If the spirometer includes a goal marker to show the highest number you have reached (best effort), use this as a goal to work toward during each repetition. After each set of 10 deep breaths, cough a few times. This will help to make sure that your lungs are clear. If you have an incision on your chest or abdomen from surgery, place a pillow or a rolled-up towel firmly against the incision when you cough. This can help to reduce pain while taking deep breaths and coughing. General tips When you are able to get out of bed: Walk around often. Continue to take deep breaths and cough in order to clear your lungs. Keep using the incentive spirometer until your health care provider says it is okay to stop using it. If you have been in the hospital, you may be told to keep using the spirometer at home. Contact a health care provider if: You are having difficulty using the  spirometer. You have trouble using the spirometer as often as instructed. Your pain medicine is not giving enough relief for you to use the spirometer as told. You have a fever. Get help right away if: You develop shortness of breath. You develop a cough with bloody mucus from the lungs. You have fluid or blood coming from an incision site after you cough. Summary An incentive spirometer is a tool that can help  you learn to take long, deep breaths to keep your lungs clear and active. You may be asked to use a spirometer after a surgery, if you have a lung problem or a history of smoking, or if you have been inactive for a long period of time. Use your incentive spirometer as instructed every 1-2 hours while you are awake. If you have an incision on your chest or abdomen, place a pillow or a rolled-up towel firmly against your incision when you cough. This will help to reduce pain. Get help right away if you have shortness of breath, you cough up bloody mucus, or blood comes from your incision when you cough. This information is not intended to replace advice given to you by your health care provider. Make sure you discuss any questions you have with your health care provider. Document Revised: 04/06/2019 Document Reviewed: 04/06/2019 Elsevier Patient Education  2024 [employer].

## 2023-02-13 MED ORDER — LACTATED RINGERS IV SOLN: INTRAVENOUS | Status: DC

## 2023-02-13 MED ORDER — CEFAZOLIN SODIUM-DEXTROSE 2-4 GM/100ML-% IV SOLN
2.0000 g | INTRAVENOUS | Status: AC
  Administered 2023-02-14: 2 g via INTRAVENOUS

## 2023-02-13 MED ORDER — ORAL CARE MOUTH RINSE: 15.0000 mL | Freq: Once | OROMUCOSAL | Status: AC

## 2023-02-13 MED ORDER — CHLORHEXIDINE GLUCONATE 0.12 % MT SOLN
15.0000 mL | Freq: Once | OROMUCOSAL | Status: AC
  Administered 2023-02-14: 15 mL via OROMUCOSAL

## 2023-02-14 ENCOUNTER — Other Ambulatory Visit: Payer: Self-pay

## 2023-02-14 ENCOUNTER — Ambulatory Visit: Payer: 59 | Admitting: Urgent Care

## 2023-02-14 ENCOUNTER — Encounter: Payer: Self-pay | Admitting: Orthopedic Surgery

## 2023-02-14 ENCOUNTER — Encounter
Admission: RE | Disposition: A | Discharge status: Home / Self Care | Payer: Self-pay | Source: Home / Self Care | Attending: Orthopedic Surgery

## 2023-02-14 ENCOUNTER — Ambulatory Visit: Payer: 59 | Admitting: Certified Registered"

## 2023-02-14 ENCOUNTER — Ambulatory Visit
Admission: RE | Admit: 2023-02-14 | Discharge: 2023-02-14 | Disposition: A | Discharge status: Home / Self Care | Payer: 59 | Attending: Orthopedic Surgery | Admitting: Orthopedic Surgery

## 2023-02-14 DIAGNOSIS — Z96651 Presence of right artificial knee joint: Secondary | ICD-10-CM | POA: Insufficient documentation

## 2023-02-14 DIAGNOSIS — M25861 Other specified joint disorders, right knee: Secondary | ICD-10-CM | POA: Diagnosis present

## 2023-02-14 DIAGNOSIS — F419 Anxiety disorder, unspecified: Secondary | ICD-10-CM | POA: Diagnosis not present

## 2023-02-14 HISTORY — PX: KNEE ARTHROSCOPY: SHX127

## 2023-02-14 SURGERY — ARTHROSCOPY, KNEE
Anesthesia: General | Site: Knee | Laterality: Right

## 2023-02-14 MED ORDER — EPHEDRINE SULFATE-NACL 50-0.9 MG/10ML-% IV SOSY
PREFILLED_SYRINGE | INTRAVENOUS | Status: DC | PRN
  Administered 2023-02-14: 10 mg via INTRAVENOUS

## 2023-02-14 MED ORDER — EPINEPHRINE PF 1 MG/ML IJ SOLN
INTRAMUSCULAR | Status: AC
  Filled 2023-02-14: qty 1

## 2023-02-14 MED ORDER — BUPIVACAINE HCL (PF) 0.25 % IJ SOLN
INTRAMUSCULAR | Status: AC
  Filled 2023-02-14: qty 30

## 2023-02-14 MED ORDER — LIDOCAINE HCL (PF) 2 % IJ SOLN
INTRAMUSCULAR | Status: AC
  Filled 2023-02-14: qty 5

## 2023-02-14 MED ORDER — ACETAMINOPHEN 10 MG/ML IV SOLN
INTRAVENOUS | Status: AC
  Filled 2023-02-14: qty 100

## 2023-02-14 MED ORDER — OXYCODONE HCL 5 MG PO TABS
5.0000 mg | ORAL_TABLET | Freq: Once | ORAL | Status: AC | PRN
  Administered 2023-02-14: 5 mg via ORAL

## 2023-02-14 MED ORDER — OXYCODONE HCL 5 MG PO TABS
ORAL_TABLET | ORAL | Status: AC
  Filled 2023-02-14: qty 1

## 2023-02-14 MED ORDER — TRAMADOL HCL 50 MG PO TABS: 50.0000 mg | ORAL_TABLET | Freq: Four times a day (QID) | ORAL | 0 refills | Status: DC | PRN

## 2023-02-14 MED ORDER — GLYCOPYRROLATE 0.2 MG/ML IJ SOLN
INTRAMUSCULAR | Status: AC
  Filled 2023-02-14: qty 1

## 2023-02-14 MED ORDER — ASPIRIN 81 MG PO TBEC: 81.0000 mg | DELAYED_RELEASE_TABLET | Freq: Every day | ORAL | 0 refills | Status: AC

## 2023-02-14 MED ORDER — LIDOCAINE HCL (CARDIAC) PF 100 MG/5ML IV SOSY
PREFILLED_SYRINGE | INTRAVENOUS | Status: DC | PRN
  Administered 2023-02-14: 100 mg via INTRAVENOUS

## 2023-02-14 MED ORDER — FENTANYL CITRATE (PF) 100 MCG/2ML IJ SOLN: 25.0000 ug | INTRAMUSCULAR | Status: DC | PRN

## 2023-02-14 MED ORDER — ONDANSETRON HCL 4 MG/2ML IJ SOLN
INTRAMUSCULAR | Status: DC | PRN
  Administered 2023-02-14: 4 mg via INTRAVENOUS

## 2023-02-14 MED ORDER — MIDAZOLAM HCL 2 MG/2ML IJ SOLN
INTRAMUSCULAR | Status: DC | PRN
  Administered 2023-02-14: 2 mg via INTRAVENOUS

## 2023-02-14 MED ORDER — ACETAMINOPHEN 10 MG/ML IV SOLN
INTRAVENOUS | Status: DC | PRN
  Administered 2023-02-14: 1000 mg via INTRAVENOUS

## 2023-02-14 MED ORDER — EPHEDRINE 5 MG/ML INJ
INTRAVENOUS | Status: AC
  Filled 2023-02-14: qty 5

## 2023-02-14 MED ORDER — FENTANYL CITRATE (PF) 100 MCG/2ML IJ SOLN
INTRAMUSCULAR | Status: AC
  Filled 2023-02-14: qty 2

## 2023-02-14 MED ORDER — MIDAZOLAM HCL 2 MG/2ML IJ SOLN
INTRAMUSCULAR | Status: AC
  Filled 2023-02-14: qty 2

## 2023-02-14 MED ORDER — OXYCODONE HCL 5 MG/5ML PO SOLN: 5.0000 mg | Freq: Once | ORAL | Status: AC | PRN

## 2023-02-14 MED ORDER — BUPIVACAINE-EPINEPHRINE 0.25% -1:200000 IJ SOLN
INTRAMUSCULAR | Status: DC | PRN
  Administered 2023-02-14: 10 mL

## 2023-02-14 MED ORDER — DEXAMETHASONE SODIUM PHOSPHATE 10 MG/ML IJ SOLN
INTRAMUSCULAR | Status: DC | PRN
  Administered 2023-02-14: 10 mg via INTRAVENOUS

## 2023-02-14 MED ORDER — CHLORHEXIDINE GLUCONATE 0.12 % MT SOLN
OROMUCOSAL | Status: AC
  Filled 2023-02-14: qty 15

## 2023-02-14 MED ORDER — PROPOFOL 10 MG/ML IV BOLUS
INTRAVENOUS | Status: DC | PRN
  Administered 2023-02-14: 150 mg via INTRAVENOUS
  Administered 2023-02-14: 50 mg via INTRAVENOUS

## 2023-02-14 MED ORDER — CEFAZOLIN SODIUM-DEXTROSE 2-4 GM/100ML-% IV SOLN
INTRAVENOUS | Status: AC
  Filled 2023-02-14: qty 100

## 2023-02-14 MED ORDER — LACTATED RINGERS IR SOLN
Status: DC | PRN
  Administered 2023-02-14 (×2): 3000 mL

## 2023-02-14 MED ORDER — FENTANYL CITRATE (PF) 100 MCG/2ML IJ SOLN
INTRAMUSCULAR | Status: DC | PRN
  Administered 2023-02-14 (×2): 50 ug via INTRAVENOUS

## 2023-02-14 MED ORDER — GLYCOPYRROLATE 0.2 MG/ML IJ SOLN
INTRAMUSCULAR | Status: DC | PRN
  Administered 2023-02-14: .2 mg via INTRAVENOUS

## 2023-02-14 MED ORDER — PHENYLEPHRINE HCL-NACL 20-0.9 MG/250ML-% IV SOLN
INTRAVENOUS | Status: DC | PRN
  Administered 2023-02-14 (×7): 80 ug via INTRAVENOUS
  Administered 2023-02-14: 160 ug via INTRAVENOUS
  Administered 2023-02-14: 80 ug via INTRAVENOUS

## 2023-02-14 MED ORDER — PHENYLEPHRINE HCL (PRESSORS) 10 MG/ML IV SOLN
INTRAVENOUS | Status: AC
  Filled 2023-02-14: qty 1

## 2023-02-14 SURGICAL SUPPLY — 29 items
BLADE FULL RADIUS 3.5 (BLADE) IMPLANT
BLADE SHAVER 4.5X7 STR FR (MISCELLANEOUS) ×1 IMPLANT
BNDG ELASTIC 6INX 5YD STR LF (GAUZE/BANDAGES/DRESSINGS) ×1 IMPLANT
BNDG ELASTIC 6X5.8 VLCR NS LF (GAUZE/BANDAGES/DRESSINGS) ×1 IMPLANT
BNDG ESMARCH 6X12 STRL LF (GAUZE/BANDAGES/DRESSINGS) ×1 IMPLANT
CHLORAPREP W/TINT 26 (MISCELLANEOUS) ×1 IMPLANT
CUFF TRNQT CYL 24X4X16.5-23 (TOURNIQUET CUFF) IMPLANT
CUFF TRNQT CYL 30X4X21-28X (TOURNIQUET CUFF) IMPLANT
DERMABOND ADVANCED .7 DNX12 (GAUZE/BANDAGES/DRESSINGS) ×1 IMPLANT
DRAPE ARTHROSCOPY W/POUCH 90 (DRAPES) ×1 IMPLANT
DRAPE IMP U-DRAPE 54X76 (DRAPES) ×1 IMPLANT
GAUZE SPONGE 4X4 12PLY STRL (GAUZE/BANDAGES/DRESSINGS) ×1 IMPLANT
GLOVE PI ORTHO PRO STRL 7.5 (GLOVE) ×2 IMPLANT
GLOVE SURG SYN 7.5 E (GLOVE) ×1
GLOVE SURG SYN 7.5 PF PI (GLOVE) ×1 IMPLANT
GOWN SRG XL LVL 3 NONREINFORCE (GOWNS) ×1 IMPLANT
GOWN STRL REUS W/ TWL LRG LVL3 (GOWN DISPOSABLE) ×1 IMPLANT
IV LR IRRIG 3000ML ARTHROMATIC (IV SOLUTION) ×2 IMPLANT
KIT TURNOVER KIT A (KITS) ×1 IMPLANT
MANIFOLD NEPTUNE II (INSTRUMENTS) ×1 IMPLANT
MAT ABSORB FLUID 56X50 GRAY (MISCELLANEOUS) ×1 IMPLANT
PACK ARTHROSCOPY KNEE (MISCELLANEOUS) ×1 IMPLANT
PAD ABD DERMACEA PRESS 5X9 (GAUZE/BANDAGES/DRESSINGS) ×1 IMPLANT
PAD ARMBOARD 7.5X6 YLW CONV (MISCELLANEOUS) ×1 IMPLANT
SHAVER BLADE INCISOR PLUS 4.5 (BLADE) IMPLANT
STRIP CLOSURE SKIN 1/2X4 (GAUZE/BANDAGES/DRESSINGS) ×1 IMPLANT
SUT MNCRL 3-0 UNDYED SH (SUTURE) ×1 IMPLANT
TUBE SET DOUBLEFLO INFLOW (TUBING) ×1 IMPLANT
WAND WEREWOLF FLOW 90D (MISCELLANEOUS) IMPLANT

## 2023-02-14 NOTE — Op Note (Signed)
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  Post-Operative Diagnosis: (same)  Procedure: Right knee diagnostic arthroscopy with partial synovectomy and scar tissue removal  Arthroscopic findings: Large nodule of scar tissue extending off the superior pole of the patella into the knee impinging with the knee box  Components/Implants: None  Date of Surgery: 02/14/2023  Surgeon: Reinaldo Berber MD  Assistant: Amador Cunas PA (present and scrubbed throughout the case, critical for assistance with exposure, retraction, instrumentation, and closure), [facility]   Anesthesiologist: Piscitello  Anesthesia: General   Tourniquet Time: 0 min  EBL: 10cc  IVF: 500cc  Complications: None   Brief history: The patient is a 63 year old male with a history of right knee status post total knee arthroplasty found on clinical and MRI exam to have patellar clunk syndrome with recurrent painful catching over his anterior knee which was reproducible on exam. The risks and benefits of knee arthroscopy as definitive surgical treatment were discussed with the patient, who opted to proceed with the operation.  The patient admitted to [facility] for the procedure.  All preoperative imaging was reviewed and an appropriate surgical plan was made prior to surgery.    Description of procedure: The patient was brought to the operating room where laterality was confirmed by all those present to be the right side.   General anesthesia was administered and the patient received an intravenous dose of antibiotics for surgical prophylaxis.  Patient is positioned supine on the operating room table with all bony prominences well-padded.  A well-padded tourniquet was applied to the right thigh and the patient was supine on the operating room table with a lateral post.  the knee was then prepped and draped in usual sterile fashion with  multiple layers of adhesive and nonadhesive drapes.  All of those present in the operating room participated in a surgical timeout laterality and patient were confirmed.    A standard lateral arthroscopic portal was obtained utilizing spinal needle and an 11 blade.  The arthroscopic camera was introduced into the knee and the knee was insufflated with saline.  A diagnostic arthroscopy was then performed starting with the patellofemoral joint inspecting both the lateral and medial gutters.  Attention was then turned to the medial compartment and a second medial portal was established under direct visualization with a 18-gauge spinal needle.  Upon examination of the knee there was found to be a large pedunculated mass of tissue off the ventral aspect of the quadriceps tendon on the superior pole of the patella.  As the knee was brought the range of motion this was found to engage with the box of the knee replacement which could be felt outside the knee.  Medial and lateral gutters were clear and the polyethylene appeared to be well-seated without any evidence of hardware complication. Using a combination of shaver and electrocautery ArthroCare the nodule was carefully removed and all bleeders were carefully coagulated and the tissue was coagulated to prevent regrowth.  Care was taken not to penetrate deeply into the quadriceps tendon or remove any tendon tissue.  The knee was taken through range of motion after removal of the scar tissue was completed and the clunk was found to have resolved.    The knee was irrigated with saline. the portals were closed with 3-0 Monocryl.  Quarter percent bupivacaine with epinephrine was injected around the portal sites.  Dermabond and Steri-Strips.  The knee was wrapped with sterile 4 x 4's,  ABDs, Webril, and Ace wrap.  Sponge, needle, and Lap counts were all correct at the end of the case.   The patient was transferred off of the operating room table to a hospital bed,  good pulses were found distally on the operative side.  The patient was transferred to the recovery room in stable condition.

## 2023-02-14 NOTE — Anesthesia Postprocedure Evaluation (Signed)
Anesthesia Post Note  Patient: Lebron Conners.  Procedure(s) Performed: Right knee arthroscopy with scar tissue resection (Right: Knee)  Patient location during evaluation: PACU Anesthesia Type: General Level of consciousness: awake and alert Pain management: pain level controlled Vital Signs Assessment: post-procedure vital signs reviewed and stable Respiratory status: spontaneous breathing, nonlabored ventilation, respiratory function stable and patient connected to nasal cannula oxygen Cardiovascular status: blood pressure returned to baseline and stable Postop Assessment: no apparent nausea or vomiting Anesthetic complications: no   No notable events documented.   Last Vitals:  Vitals:   02/14/23 1630 02/14/23 1642  BP: 132/83 (!) 143/91  Pulse: 80 83  Resp: (!) 22 15  Temp: (!) 36.3 C (!) 36.3 C  SpO2: 97% 95%    Last Pain:  Vitals:   02/14/23 1642  TempSrc: Temporal  PainSc: 0-No pain                 Cleda Mccreedy Leray Garverick

## 2023-02-14 NOTE — Discharge Instructions (Addendum)
Instructions after knee arthroscopy   Reinaldo Berber M.D.     Dept. of Orthopaedics & Sports Medicine  [facility]  [address]  [place], [place]  [address]  Phone: [phone]   Fax: [phone]    DIET: Drink plenty of non-alcoholic fluids. Resume your normal diet. Include foods high in fiber.  ACTIVITY:  You may use crutches or a walker,  weight-bearing as tolerated.  You may discontinue walker/crutches once you feel as if you are able to ambulate with a good steady gait and no limp. Continue doing gentle range of motion exercises. Exercising will reduce the pain and swelling, increase motion, and prevent muscle weakness.   Do not drive or operate any equipment until instructed.  WOUND CARE:  use ice packs periodically to reduce pain and swelling. You may remove Ace wrap and begin showering 3 days after surgery. Allow Steri-Strips/Dermabond (glue) to come up on their own.   MEDICATIONS: You may resume your regular medications. Please take the pain medication as prescribed on the medication. Do not take pain medication on an empty stomach. Do not drive or drink alcoholic beverages when taking pain medications.  POSTOPERATIVE CONSTIPATION PROTOCOL Constipation - defined medically as fewer than three stools per week and severe constipation as less than one stool per week.  One of the most common issues patients have following surgery is constipation.  Even if you have a regular bowel pattern at home, your normal regimen is likely to be disrupted due to multiple reasons following surgery.  Combination of anesthesia, postoperative narcotics, change in appetite and fluid intake all can affect your bowels.  In order to avoid complications following surgery, here are some recommendations in order to help you during your recovery period.  Colace (docusate) - Pick up an over-the-counter form of Colace or another stool softener and take twice a day as long as you are  requiring postoperative pain medications.  Take with a full glass of water daily.  If you experience loose stools or diarrhea, hold the colace until you stool forms back up.  If your symptoms do not get better within 1 week or if they get worse, check with your doctor.  Dulcolax (bisacodyl) - Pick up over-the-counter and take as directed by the product packaging as needed to assist with the movement of your bowels.  Take with a full glass of water.  Use this product as needed if not relieved by Colace only.   MiraLax (polyethylene glycol) - Pick up over-the-counter to have on hand.  MiraLax is a solution that will increase the amount of water in your bowels to assist with bowel movements.  Take as directed and can mix with a glass of water, juice, soda, coffee, or tea.  Take if you go more than two days without a movement. Do not use MiraLax more than once per day. Call your doctor if you are still constipated or irregular after using this medication for 7 days in a row.  If you continue to have problems with postoperative constipation, please contact the office for further assistance and recommendations.  If you experience "the worst abdominal pain ever" or develop nausea or vomiting, please contact the office immediatly for further recommendations for treatment.   CALL THE OFFICE FOR: Temperature above 101 degrees Excessive bleeding or drainage on the dressing. Excessive swelling, coldness, or paleness of the toes. Persistent nausea and vomiting.  FOLLOW-UP:  You should have an appointment to return to the office in 14 days  after surgery.

## 2023-02-14 NOTE — Anesthesia Preprocedure Evaluation (Signed)
Anesthesia Evaluation  Patient identified by MRN, date of birth, ID band Patient awake    Reviewed: Allergy & Precautions, H&P , NPO status , Patient's Chart, lab work & pertinent test results  History of Anesthesia Complications Negative for: history of anesthetic complications  Airway Mallampati: II  TM Distance: >3 FB Neck ROM: full    Dental  (+) Dental Advidsory Given, Teeth Intact   Pulmonary neg pulmonary ROS, neg shortness of breath   Pulmonary exam normal breath sounds clear to auscultation       Cardiovascular Exercise Tolerance: Good (-) angina (-) Past MI and (-) DOE negative cardio ROS Normal cardiovascular exam Rhythm:regular Rate:Normal     Neuro/Psych   Anxiety     negative neurological ROS  negative psych ROS   GI/Hepatic negative GI ROS, Neg liver ROS,,,  Endo/Other  negative endocrine ROS    Renal/GU      Musculoskeletal   Abdominal   Peds  Hematology negative hematology ROS (+)   Anesthesia Other Findings Past Medical History: No date: Anxiety No date: Arthritis     Comment:  BOTH HANDS No date: BPH (benign prostatic hypertrophy) No date: Elevated PSA No date: History of kidney stones 2008: History of methicillin resistant staphylococcus aureus (MRSA) No date: HLD (hyperlipidemia) No date: Nocturia No date: Over weight 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, right No date: Pulmonary nodule No date: Rotator cuff tear 2017: Seizures (HCC)     Comment:  last seizure was 2017  Past Surgical History: 2014?: COLONOSCOPY 02/17/2015: INGUINAL HERNIA REPAIR; Left     Comment:  Procedure: LAPAROSCOPIC INGUINAL HERNIA;  Surgeon:               Kieth Brightly, MD;  Location: [facility];  Service:              General;  Laterality: Left; 01/13/2019: KNEE ARTHROSCOPY WITH MEDIAL MENISECTOMY; Right     Comment:  Procedure: KNEE ARTHROSCOPY WITH MEDIAL MENISECTOMY;                Surgeon:  Juanell Fairly, MD;  Location: [facility];                Service: Orthopedics;  Laterality: Right; No date: LITHOTRIPSY     Comment:  x 2 09/23/2014: SHOULDER ARTHROSCOPY WITH OPEN ROTATOR CUFF REPAIR; Left     Comment:  Procedure: SHOULDER ARTHROSCOPY , debridement,               decompression, WITH  mini OPEN ROTATOR CUFF REPAIR;                Surgeon: Christena Flake, MD;  Location: [facility];  Service:              Orthopedics;  Laterality: Left; No date: SHOULDER SURGERY; Bilateral     Comment:  rotator cuff repair 05/09/2021: TOTAL KNEE ARTHROPLASTY; Right     Comment:  Procedure: TOTAL KNEE ARTHROPLASTY;  Surgeon: Juanell Fairly, MD;  Location: [facility];  Service: Orthopedics;                Laterality: Right; 02/17/2015: UMBILICAL HERNIA REPAIR; N/A     Comment:  Procedure: LAPAROSCOPIC UMBILICAL HERNIA;  Surgeon:               Kieth Brightly, MD;  Location: [facility];  Service:  General;  Laterality: N/A;  BMI    Body Mass Index: 32.19 kg/m      Reproductive/Obstetrics negative OB ROS                             Anesthesia Physical Anesthesia Plan  ASA: 2  Anesthesia Plan: General   Post-op Pain Management:    Induction: Intravenous  PONV Risk Score and Plan: 2 and Ondansetron, Dexamethasone and Midazolam  Airway Management Planned: LMA  Additional Equipment:   Intra-op Plan:   Post-operative Plan: Extubation in OR  Informed Consent: I have reviewed the patients History and Physical, chart, labs and discussed the procedure including the risks, benefits and alternatives for the proposed anesthesia with the patient or authorized representative who has indicated his/her understanding and acceptance.     Dental Advisory Given  Plan Discussed with: Anesthesiologist, CRNA and Surgeon  Anesthesia Plan Comments: (Patient consented for risks of anesthesia including but not limited to:  - adverse reactions to  medications - damage to eyes, teeth, lips or other oral mucosa - nerve damage due to positioning  - sore throat or hoarseness - Damage to heart, brain, nerves, lungs, other parts of body or loss of life  Patient voiced understanding and assent.)       Anesthesia Quick Evaluation

## 2023-02-14 NOTE — Interval H&P Note (Signed)
Patient history and physical updated. Consent reviewed including risks, benefits, and alternatives to surgery. Patient agrees with above plan to proceed with right knee arthroscopy with partial synovectomy and scar tissue removal.

## 2023-02-14 NOTE — H&P (Signed)
History of Present Illness: The patient is an 63 y.o. male seen in clinic today for follow-up evaluation of his right knee prior to planned right knee arthroscopy for patellar clunk syndrome. Patient has continuing crunching and popping sensations over the anterior aspect of his knee when going from flexion to extension which is not improved since his last visit. His total knee replacement was done in March 2023 and has had ongoing pain and clunking ever since the surgery. He denies any fevers chills or other systemic symptoms or any redness around the knee. He has tried conservative treatments with anti-inflammatories and physical therapy without improvement. The patient denies fevers, chills, numbness, tingling, shortness of breath, chest pain, recent illness, or any trauma.  Past Medical History: Past Medical History:  Diagnosis Date  Allergic state  History of nephrolithiasis  History of rotator cuff tear  Right shoulder rotator cuff tear   Past Surgical History: Past Surgical History:  Procedure Laterality Date  Right shoulder rotator cuff repair with SAD 02/17/09  Left rotator cuff repair Left 12/17/13  limited arthroscopic debridement, arthroscopic subacromial decompression, and mini-open rotator cuff repair left shoulder Left 08.25.16  Dr.Poggi  Lithotripsy   Past Family History: Family History  Problem Relation Age of Onset  Cancer Mother  Prostate cancer Father  Colon cancer Paternal Grandmother 60  Prostate cancer Other   Medications: Current Outpatient Medications  Medication Sig Dispense Refill  atorvastatin (LIPITOR) 10 MG tablet Take 1 tablet by mouth once daily  docusate (COLACE) 100 MG capsule Take 1 capsule by mouth 2 (two) times daily  escitalopram oxalate (LEXAPRO) 10 MG tablet Take 1 tablet by mouth once daily.  ibuprofen (ADVIL,MOTRIN) 200 MG tablet Take 600 mg by mouth every 6 (six) hours as needed.  lacosamide (VIMPAT) 150 mg tablet Take 1 tablet by mouth 2  (two) times daily  meloxicam (MOBIC) 7.5 MG tablet Take 1 tablet by mouth 2 (two) times daily  methocarbamoL (ROBAXIN) 500 MG tablet 1/2-1 po qHS prn 30 tablet 0  predniSONE (DELTASONE) 5 MG tablet Take as directed - 6 day taper 21 tablet 0   No current facility-administered medications for this visit.   Allergies: Allergies  Allergen Reactions  Penicillins Unknown    Visit Vitals: Vitals:  02/05/23 1453  BP: 136/84    Review of Systems:  A comprehensive 14 point ROS was performed, reviewed, and the pertinent orthopaedic findings are documented in the HPI.  Physical Exam: General/Constitutional: No apparent distress: well-nourished and well developed. Eyes: Pupils equal, round with synchronous movement. Pulmonary exam: Lungs clear to auscultation bilaterally no wheezing rales or rhonchi Cardiac exam: Regular rate and rhythm no obvious murmurs rubs or gallops. Integumentary: No impressive skin lesions present, except as noted in detailed exam. Neuro/Psych: Normal mood and affect, oriented to person, place and time.  Comprehensive Knee Exam: Gait Non-antalgic and fluid  Alignment Neutral   Inspection Right  Skin Normal appearance with no obvious deformity. No ecchymosis or erythema. Healed midline incision  Soft Tissue No focal soft tissue swelling  Quad Atrophy None   Palpation  Right  Tenderness Parapatellar tenderness with palpable fullness retropatellar  Crepitus Patellofemoral crepitation when going from flexion to extension in the peripatellar and retropatellar area consistent with patellar clunk  Effusion None   Range of Motion Right  Flexion 0-117  Extension Full knee extension without hyperextension   Ligamentous Exam Right  AP motion in flexion 5-30mm  Varus/Valgus 0 1-34mm  Varus/Valgus 90 2-108mm    Neurovascular Right  Quadriceps Strength 5/5  Hamstring Strength 5/5  Hip Abductor Strength 4/5  Distal Motor Normal  Distal Sensory Normal light  touch sensation  Distal Pulses Normal    Imaging Studies: I have reviewed AP, lateral, flexed PA, and sunrise weight bearing knee X-rays (4 views) of the right knee taken the last office visit show the patient is s/p total knee arthroplasty with components in appropriate appearing position there is cemented with a posterior stabilized design. No obvious evidence of any periprosthetic fracture or loosening. The patella is resurfaced and tracking in midline.   MRI of the right knee performed on 11/13/2022 without contrast images and report reviewed by myself. No evidence of any periprosthetic loosening or infection. I do note some scar tissue which appears to be on the ventral surface of thequadriceps tendon. Radiologist does not comment on this due to some limitation in evaluation for metal artifact. Full read below.   Narrative  CLINICAL DATA: Right knee pain, history of right total knee arthroplasty  EXAM: MRI OF THE RIGHT KNEE WITHOUT CONTRAST  TECHNIQUE: Multiplanar, multisequence MR imaging of the knee was performed. No intravenous contrast was administered.  COMPARISON: None Available.  FINDINGS: Bones/Joint/Cartilage  No acute fracture or dislocation.  Right total knee arthroplasty with susceptibility artifact partially obscuring the adjacent soft tissue and osseous structures limiting evaluation. No osteolysis or periarticular fluid collection. Normal alignment. Small joint effusion. No marrow signal abnormality.  Ligaments  Collateral ligaments are intact.  Muscles and Tendons  Quadriceps tendon and patellar tendon are grossly intact. Mild muscle edema in the posterior aspect of the medial and lateral gastrocnemius muscles partially visualized which may reflect mild muscle strain. No muscle atrophy.  Soft tissue No fluid collection or hematoma. No soft tissue mass.  IMPRESSION: 1. Right total knee arthroplasty with susceptibility artifact partially obscuring  the adjacent soft tissue and osseous structures limiting evaluation. No osteolysis or periarticular fluid collection. 2. Mild muscle edema in the posterior aspect of the medial and lateral gastrocnemius muscles partially visualized which may reflect mild muscle strain.  Electronically Signed By: Elige Ko M.D. On: 11/25/2022 07:57  Assessment:  Right knee patellar clunk status post total knee arthroplasty  Plan: Shannan is a 63 year old male who presents with reproducible patellar clunk in his right knee with tenderness and reproducible clunking on exam. MRI does not show any loosening of his implants but does show on my read some scar tissue in the retropatellar and suprapatellar area. This is consistent with his physical exam. We discussed treatment options including continued therapy versus a possible knee arthroscopy and the patient would like a more permanent solution to his problem. There is no instability on exam today and the knee is stable throughout range of motion. Based upon the patient's continued symptoms and failure to respond to conservative treatment, I have recommended a right knee arthroscopy with scar tissue resection for this patient. A long discussion took place with the patient describing what a knee arthroscopy is and what the procedure would entail. A knee model was used to discuss the issue and explained the procedure.  The hospitalization and post-operative care and rehabilitation were also discussed. The use of perioperative antibiotics and DVT prophylaxis were discussed. The risk, benefits and alternatives to a surgical intervention were discussed at length with the patient. The patient was also advised of risks related to the medical comorbidities and elevated body mass index (BMI). A lengthy discussion took place to review the most common complications including but not limited to:  stiffness, loss of function, complex regional pain syndrome, deep vein thrombosis,  pulmonary embolus, heart attack, stroke, infection, wound breakdown, numbness, intraoperative fracture, damage to nerves, tendon,muscles, arteries or other blood vessels, death and other possible complications from anesthesia. The patient was told that we will take steps to minimize these risks by using sterile technique, antibiotics and DVT prophylaxis when appropriate and follow the patient postoperatively in the office setting to monitor progress. The possibility of recurrent pain, no improvement in pain and actual worsening of pain were also discussed with the patient. We discussed the risk of tendon rupture and the risk of scar tissue re-forming.    The discharge plan of care focused on the patient going home following surgery. The patient was encouraged to make the necessary arrangements to have someone stay with them when they are discharged home.   The benefits of surgery were discussed with the patient including the potential for improving the patient's current clinical condition through operative intervention. Alternatives to surgical intervention including continued conservative management were also discussed in detail. All questions were answered to the satisfaction of the patient. The patient participated and agreed to the plan of care.  All questions answered patient agrees the plan to move forward with right knee arthroscopy with partial synovectomy for removal of patellar clunk nodule.  Portions of this record have been created using Scientist, clinical (histocompatibility and immunogenetics). Dictation errors have been sought, but may not have been identified and corrected.  Reinaldo Berber MD

## 2023-02-14 NOTE — Transfer of Care (Signed)
Immediate Anesthesia Transfer of Care Note  Patient: Mark Hernandez.  Procedure(s) Performed: Right knee arthroscopy with scar tissue resection (Right: Knee)  Patient Location: PACU  Anesthesia Type:General  Level of Consciousness: awake and drowsy  Airway & Oxygen Therapy: Patient Spontanous Breathing and Patient connected to face mask oxygen  Post-op Assessment: Report given to RN and Post -op Vital signs reviewed and stable  Post vital signs: Reviewed and stable  Last Vitals:  Vitals Value Taken Time  BP 143/76 02/14/23 1603  Temp 36.1 1603  Pulse 79 02/14/23 1607  Resp 13 02/14/23 1607  SpO2 100 % 02/14/23 1607  Vitals shown include unfiled device data.  Last Pain:  Vitals:   02/14/23 1414  TempSrc: Temporal  PainSc: 0-No pain         Complications: No notable events documented.

## 2023-02-14 NOTE — Anesthesia Procedure Notes (Signed)
Procedure Name: LMA Insertion Date/Time: 02/14/2023 3:13 PM  Performed by: Morene Crocker, CRNAPre-anesthesia Checklist: Patient identified, Patient being monitored, Timeout performed, Emergency Drugs available and Suction available Patient Re-evaluated:Patient Re-evaluated prior to induction Oxygen Delivery Method: Circle system utilized Preoxygenation: Pre-oxygenation with 100% oxygen Induction Type: IV induction Ventilation: Mask ventilation without difficulty LMA: LMA inserted LMA Size: 5.0 Tube type: Oral Number of attempts: 1 Placement Confirmation: positive ETCO2 and breath sounds checked- equal and bilateral Tube secured with: Tape Dental Injury: Teeth and Oropharynx as per pre-operative assessment  Comments: Smooth atraumatic placement of LMA, no complications noted.

## 2023-02-15 ENCOUNTER — Encounter: Payer: Self-pay | Admitting: Orthopedic Surgery

## 2023-03-13 ENCOUNTER — Ambulatory Visit: Payer: Self-pay | Admitting: Family Medicine

## 2023-03-13 ENCOUNTER — Telehealth: Payer: 59 | Admitting: Physician Assistant

## 2023-03-13 DIAGNOSIS — J101 Influenza due to other identified influenza virus with other respiratory manifestations: Secondary | ICD-10-CM | POA: Diagnosis not present

## 2023-03-13 MED ORDER — ALBUTEROL SULFATE HFA 108 (90 BASE) MCG/ACT IN AERS
1.0000 | INHALATION_SPRAY | Freq: Four times a day (QID) | RESPIRATORY_TRACT | 0 refills | Status: DC | PRN
Start: 2023-03-13 — End: 2023-04-05

## 2023-03-13 MED ORDER — PROMETHAZINE-DM 6.25-15 MG/5ML PO SYRP: 5.0000 mL | ORAL_SOLUTION | Freq: Four times a day (QID) | ORAL | 0 refills | Status: DC | PRN

## 2023-03-13 MED ORDER — BENZONATATE 100 MG PO CAPS: 100.0000 mg | ORAL_CAPSULE | Freq: Three times a day (TID) | ORAL | 0 refills | Status: DC | PRN

## 2023-03-13 MED ORDER — FLUTICASONE PROPIONATE 50 MCG/ACT NA SUSP: 2.0000 | Freq: Every day | NASAL | 0 refills | Status: DC

## 2023-03-13 NOTE — Telephone Encounter (Signed)
Noted. Thanks.

## 2023-03-13 NOTE — Progress Notes (Signed)
Virtual Visit Consent   Mark Depuy., you are scheduled for a virtual visit with a [facility] provider today. Just as with appointments in the office, your consent must be obtained to participate. Your consent will be active for this visit and any virtual visit you may have with one of our providers in the next 365 days. If you have a MyChart account, a copy of this consent can be sent to you electronically.  As this is a virtual visit, video technology does not allow for your provider to perform a traditional examination. This may limit your provider's ability to fully assess your condition. If your provider identifies any concerns that need to be evaluated in person or the need to arrange testing (such as labs, EKG, etc.), we will make arrangements to do so. Although advances in technology are sophisticated, we cannot ensure that it will always work on either your end or our end. If the connection with a video visit is poor, the visit may have to be switched to a telephone visit. With either a video or telephone visit, we are not always able to ensure that we have a secure connection.  By engaging in this virtual visit, you consent to the provision of healthcare and authorize for your insurance to be billed (if applicable) for the services provided during this visit. Depending on your insurance coverage, you may receive a charge related to this service.  I need to obtain your verbal consent now. Are you willing to proceed with your visit today? Mark Conners. has provided verbal consent on 03/13/2023 for a virtual visit (video or telephone). Margaretann Loveless, PA-C  Date: 03/13/2023 5:17 PM   Virtual Visit via Video Note   I, Margaretann Loveless, connected with  Mark Hernandez.  ([id number], 1960-04-25) on 03/13/23 at  5:00 PM EST by a video-enabled telemedicine application and verified that I am speaking with the correct person using two identifiers.  Location: Patient: Virtual Visit  Location Patient: Home Provider: Virtual Visit Location Provider: Home Office   I discussed the limitations of evaluation and management by telemedicine and the availability of in person appointments. The patient expressed understanding and agreed to proceed.    History of Present Illness: Mark Hilbun. is a 63 y.o. who identifies as a male who was assigned male at birth, and is being seen today for flu.  HPI: URI  This is a new problem. The current episode started in the past 7 days (Since Sunday night, fever started Monday). The problem has been unchanged. The maximum temperature recorded prior to his arrival was 102 - 102.9 F. The fever has been present for 3 to 4 days. Associated symptoms include chest pain, congestion, coughing (dry), headaches, rhinorrhea (and post nasal drainage), a sore throat and wheezing (last night was worst). Pertinent negatives include no diarrhea, ear pain, nausea, plugged ear sensation, sinus pain or vomiting. Associated symptoms comments: Back and ribs hurt from coughing. He has tried acetaminophen, NSAIDs and increased fluids (mucinex) for the symptoms. The treatment provided no relief.    Tested positive for Influenza A today   Problems:  Patient Active Problem List   Diagnosis Date Noted   S/P TKR (total knee replacement) using cement, right 05/09/2021   Radicular pain in right arm 09/02/2019   Right knee pain 10/26/2018   HLD (hyperlipidemia) 08/25/2017   Anxiety 08/25/2017   Pulmonary nodule 08/25/2017   BPH with obstruction/lower urinary tract symptoms 03/18/2015  Family history of prostate cancer 03/18/2015   Routine general medical examination at a health care facility 12/31/2014   Advance care planning 12/31/2014   Injury of left shoulder 05/05/2013   Seizures (HCC) 05/04/2013   Syncope and collapse 05/04/2013    Allergies:  Allergies  Allergen Reactions   Penicillins Rash    As a child    Medications:  Current Outpatient  Medications:    albuterol (VENTOLIN HFA) 108 (90 Base) MCG/ACT inhaler, Inhale 1-2 puffs into the lungs every 6 (six) hours as needed., Disp: 8 g, Rfl: 0   benzonatate (TESSALON) 100 MG capsule, Take 1-2 capsules (100-200 mg total) by mouth 3 (three) times daily as needed., Disp: 30 capsule, Rfl: 0   fluticasone (FLONASE) 50 MCG/ACT nasal spray, Place 2 sprays into both nostrils daily., Disp: 16 g, Rfl: 0   promethazine-dextromethorphan (PROMETHAZINE-DM) 6.25-15 MG/5ML syrup, Take 5 mLs by mouth 4 (four) times daily as needed., Disp: 118 mL, Rfl: 0   acetaminophen (TYLENOL) 500 MG tablet, Take 1,000 mg by mouth at bedtime., Disp: , Rfl:    atorvastatin (LIPITOR) 10 MG tablet, Take 1 tablet (10 mg total) by mouth daily. (Patient taking differently: Take 10 mg by mouth at bedtime.), Disp: 90 tablet, Rfl: 3   escitalopram (LEXAPRO) 10 MG tablet, Take 1 tablet (10 mg total) by mouth daily. (Patient taking differently: Take 10 mg by mouth every morning.), Disp: 90 tablet, Rfl: 3   Lacosamide 150 MG TABS, Take 1 tablet (150 mg total) by mouth 2 (two) times daily., Disp: 60 tablet, Rfl: 5   OVER THE COUNTER MEDICATION, Take 1 capsule by mouth daily. COMFORT GUARD x24 supplement, Disp: , Rfl:    traMADol (ULTRAM) 50 MG tablet, Take 1 tablet (50 mg total) by mouth every 6 (six) hours as needed., Disp: 30 tablet, Rfl: 0  Observations/Objective: Patient is well-developed, well-nourished in no acute distress.  Resting comfortably at home.  Head is normocephalic, atraumatic.  No labored breathing.  Speech is clear and coherent with logical content.  Patient is alert and oriented at baseline.    Assessment and Plan: 1. Influenza A (Primary) - albuterol (VENTOLIN HFA) 108 (90 Base) MCG/ACT inhaler; Inhale 1-2 puffs into the lungs every 6 (six) hours as needed.  Dispense: 8 g; Refill: 0 - benzonatate (TESSALON) 100 MG capsule; Take 1-2 capsules (100-200 mg total) by mouth 3 (three) times daily as needed.   Dispense: 30 capsule; Refill: 0 - promethazine-dextromethorphan (PROMETHAZINE-DM) 6.25-15 MG/5ML syrup; Take 5 mLs by mouth 4 (four) times daily as needed.  Dispense: 118 mL; Refill: 0 - fluticasone (FLONASE) 50 MCG/ACT nasal spray; Place 2 sprays into both nostrils daily.  Dispense: 16 g; Refill: 0  - Influenza A positive today - Out of treatment window for Tamiflu - Continue symptomatic treatment of choice - Add Albuterol for wheezing, chest tightness or shortness of breath - Tessalon perles and Promethazine DM for cough - Flonase for nasal congestion and drainage - Tylenol and Ibuprofen for fevers and body aches - Push fluids, electrolyte beverages - Seek in person evaluation if symptoms worsen or fail to improve  Follow Up Instructions: I discussed the assessment and treatment plan with the patient. The patient was provided an opportunity to ask questions and all were answered. The patient agreed with the plan and demonstrated an understanding of the instructions.  A copy of instructions were sent to the patient via MyChart unless otherwise noted below.    The patient was advised to call  back or seek an in-person evaluation if the symptoms worsen or if the condition fails to improve as anticipated.    Margaretann Loveless, PA-C

## 2023-03-13 NOTE — Telephone Encounter (Signed)
FYI, Patient is scheduled for a virtual visit with urgent care today.

## 2023-03-13 NOTE — Telephone Encounter (Signed)
  Chief Complaint: Flu Symptoms: cough, fever Frequency: symptoms started Monday Pertinent Negatives: Patient denies CP, SOB Disposition: [] ED /[] Urgent Care (no appt availability in office) / [] Appointment(In office/virtual)/ [x]  [facility]/ [] Home Care/ [] Refused Recommended Disposition /[] [facility]/ []  Follow-up with PCP Additional Notes: patient's wife called in for patient testing positive for flu along with wife being exposed. Patient having symptoms of cough and fever. Wife states patient may have been exposed this past Saturday at Urgent Care. Symptoms started on Monday-tested positive last night. Wife is hoping he can get Tamiflu. Virtual Urgent Care appointment setup for patient for 03/13/2023 at 5:00 pm. Patient's wife verbalized understanding and all questions answered.       Reason for Disposition  [1] Patient is NOT HIGH RISK AND [2] strongly requests antiviral medicine AND [3] flu symptoms present < 48 hours  Answer Assessment - Initial Assessment Questions 1. WORST SYMPTOM: "What is your worst symptom?" (e.g., cough, runny nose, muscle aches, headache, sore throat, fever)      Cough, fever 2. ONSET: "When did your flu symptoms start?"      Monday 03/11/2023 3. COUGH: "How bad is the cough?"       "Pretty bad" 4. RESPIRATORY DISTRESS: "Describe your breathing."      No breathing concerns 5. FEVER: "Do you have a fever?" If Yes, ask: "What is your temperature, how was it measured, and when did it start?"     Highest temperature of 101.5-currently according to wife no temperature at this time 6. EXPOSURE: "Were you exposed to someone with influenza?"       unsure 7. FLU VACCINE: "Did you get a flu shot this year?"     Yes 8. HIGH RISK DISEASE: "Do you have any chronic medical problems?" (e.g., heart or lung disease, asthma, weak immune system, or other HIGH RISK conditions)     No 10. OTHER SYMPTOMS: "Do you have any other symptoms?"  (e.g., runny  nose, muscle aches, headache, sore throat)       Body aches, tired  Protocols used: Influenza (Flu) - [facility]

## 2023-03-13 NOTE — Patient Instructions (Signed)
Mark Hernandez., thank you for joining Margaretann Loveless, PA-C for today's virtual visit.  While this provider is not your primary care provider (PCP), if your PCP is located in our provider database this encounter information will be shared with them immediately following your visit.   A [facility] MyChart account gives you access to today's visit and all your visits, tests, and labs performed at [facility] " click here if you don't have a [facility] MyChart account or go to [email or web address]  Consent: (Patient) Mark Hernandez. provided verbal consent for this virtual visit at the beginning of the encounter.  Current Medications:  Current Outpatient Medications:    albuterol (VENTOLIN HFA) 108 (90 Base) MCG/ACT inhaler, Inhale 1-2 puffs into the lungs every 6 (six) hours as needed., Disp: 8 g, Rfl: 0   benzonatate (TESSALON) 100 MG capsule, Take 1-2 capsules (100-200 mg total) by mouth 3 (three) times daily as needed., Disp: 30 capsule, Rfl: 0   fluticasone (FLONASE) 50 MCG/ACT nasal spray, Place 2 sprays into both nostrils daily., Disp: 16 g, Rfl: 0   promethazine-dextromethorphan (PROMETHAZINE-DM) 6.25-15 MG/5ML syrup, Take 5 mLs by mouth 4 (four) times daily as needed., Disp: 118 mL, Rfl: 0   acetaminophen (TYLENOL) 500 MG tablet, Take 1,000 mg by mouth at bedtime., Disp: , Rfl:    atorvastatin (LIPITOR) 10 MG tablet, Take 1 tablet (10 mg total) by mouth daily. (Patient taking differently: Take 10 mg by mouth at bedtime.), Disp: 90 tablet, Rfl: 3   escitalopram (LEXAPRO) 10 MG tablet, Take 1 tablet (10 mg total) by mouth daily. (Patient taking differently: Take 10 mg by mouth every morning.), Disp: 90 tablet, Rfl: 3   Lacosamide 150 MG TABS, Take 1 tablet (150 mg total) by mouth 2 (two) times daily., Disp: 60 tablet, Rfl: 5   OVER THE COUNTER MEDICATION, Take 1 capsule by mouth daily. COMFORT GUARD x24 supplement, Disp: , Rfl:    traMADol (ULTRAM) 50 MG tablet, Take  1 tablet (50 mg total) by mouth every 6 (six) hours as needed., Disp: 30 tablet, Rfl: 0   Medications ordered in this encounter:  Meds ordered this encounter  Medications   albuterol (VENTOLIN HFA) 108 (90 Base) MCG/ACT inhaler    Sig: Inhale 1-2 puffs into the lungs every 6 (six) hours as needed.    Dispense:  8 g    Refill:  0    Supervising Provider:   Merrilee Jansky [[id number]]   benzonatate (TESSALON) 100 MG capsule    Sig: Take 1-2 capsules (100-200 mg total) by mouth 3 (three) times daily as needed.    Dispense:  30 capsule    Refill:  0    Supervising Provider:   Merrilee Jansky [[id number]]   promethazine-dextromethorphan (PROMETHAZINE-DM) 6.25-15 MG/5ML syrup    Sig: Take 5 mLs by mouth 4 (four) times daily as needed.    Dispense:  118 mL    Refill:  0    Supervising Provider:   Merrilee Jansky [[id number]]   fluticasone (FLONASE) 50 MCG/ACT nasal spray    Sig: Place 2 sprays into both nostrils daily.    Dispense:  16 g    Refill:  0    Supervising Provider:   Merrilee Jansky [[id number]]     *If you need refills on other medications prior to your next appointment, please contact your pharmacy*  Follow-Up: Call back or seek an in-person evaluation if the symptoms worsen or if the  condition fails to improve as anticipated.  [facility] [phone]  Other Instructions Influenza, Adult Influenza is also called the flu. It's an infection that affects your respiratory tract. This includes your nose, throat, windpipe, and lungs. The flu is contagious. This means it spreads easily from person to person. It causes symptoms that are like a cold. It can also cause a high fever and body aches. What are the causes? The flu is caused by the influenza virus. You can get it by: Breathing in droplets that are in the air after an infected person coughs or sneezes. Touching something that has the virus on it and then touching your mouth, nose, or eyes. What increases  the risk? You may be more likely to get the flu if: You don't wash your hands often. You're near a lot of people during cold and flu season. You touch your mouth, eyes, or nose without washing your hands first. You don't get a flu shot each year. You may also be more at risk for the flu and serious problems, such as a lung infection called pneumonia, if: You're older than 65. You're pregnant. Your immune system is weak. Your immune system is your body's defense system. You have a long-term, or chronic, condition, such as: Heart, kidney, or lung disease. Diabetes. A liver disorder. Asthma. You're very overweight. You have anemia. This is when you don't have enough red blood cells in your body. What are the signs or symptoms? Flu symptoms often start all of a sudden. They may last 4-14 days and include: Fever and chills. Headaches, body aches, or muscle aches. Sore throat. Cough. Runny or stuffy nose. Discomfort in your chest. Not wanting to eat as much as normal. Feeling weak or tired. Feeling dizzy. Nausea or vomiting. How is this diagnosed? The flu may be diagnosed based on your symptoms and medical history. You may also have a physical exam. A swab may be taken from your nose or throat and tested for the virus. How is this treated? If the flu is found early, you can be treated with antiviral medicine. This may be given to you by mouth or through an IV. It can help you feel less sick and get better faster. Taking care of yourself at home can also help your symptoms get better. Your health care provider may tell you to: Take over-the-counter medicines. Drink lots of fluids. The flu often goes away on its own. If you have very bad symptoms or problems caused by the flu, you may need to be treated in a hospital. Follow these instructions at home: Activity Rest as needed. Get lots of sleep. Stay home from work or school as told by your provider. Leave home only to go see your  provider. Do not leave home for other reasons until you don't have a fever for 24 hours without taking medicine. Eating and drinking Take an oral rehydration solution (ORS). This is a drink that is sold at pharmacies and stores. Drink enough fluid to keep your pee pale yellow. Try to drink small amounts of clear fluids. These include water, ice chips, fruit juice mixed with water, and low-calorie sports drinks. Try to eat bland foods that are easy to digest. These include bananas, applesauce, rice, lean meats, toast, and crackers. Avoid drinks that have a lot of sugar or caffeine in them. These include energy drinks, regular sports drinks, and soda. Do not drink alcohol. Do not eat spicy or fatty foods. General instructions  Take your medicines only as told by your provider. Use a cool mist humidifier to add moisture to the air in your home. This can make it easier for you to breathe. You should also clean the humidifier every day. To do so: Empty the water. Pour clean water in. Cover your mouth and nose when you cough or sneeze. Wash your hands with soap and water often and for at least 20 seconds. It's extra important to do so after you cough or sneeze. If you can't use soap and water, use hand sanitizer. How is this prevented?  Get a flu shot every year. Ask your provider when you should get your flu shot. Stay away from people who are sick during fall and winter. Fall and winter are cold and flu season. Contact a health care provider if: You get new symptoms. You have chest pain. You have watery poop, also called diarrhea. You have a fever. Your cough gets worse. You start to have more mucus. You feel like you may vomit, or you vomit. Get help right away if: You become short of breath or have trouble breathing. Your skin or nails turn blue. You have very bad pain or stiffness in your neck. You get a sudden headache or pain in your face or ear. You vomit each time you eat  or drink. These symptoms may be an emergency. Call 911 right away. Do not wait to see if the symptoms will go away. Do not drive yourself to the hospital. This information is not intended to replace advice given to you by your health care provider. Make sure you discuss any questions you have with your health care provider. Document Revised: 10/18/2022 Document Reviewed: 02/22/2022 Elsevier Patient Education  2024 [employer].   If you have been instructed to have an in-person evaluation today at a local Urgent Care facility, please use the link below. It will take you to a list of all of our available [facility], including address, phone number and hours of operation. Please do not delay care.  [facility]  If you or a family member do not have a primary care provider, use the link below to schedule a visit and establish care. When you choose a [facility] primary care physician or advanced practice provider, you gain a long-term partner in health. Find a Primary Care Provider  Learn more about 's in-office and virtual care options:  - Get Care Now

## 2023-04-01 ENCOUNTER — Other Ambulatory Visit: Payer: Self-pay

## 2023-04-01 DIAGNOSIS — N138 Other obstructive and reflux uropathy: Secondary | ICD-10-CM

## 2023-04-02 ENCOUNTER — Other Ambulatory Visit: Payer: 59

## 2023-04-02 DIAGNOSIS — N138 Other obstructive and reflux uropathy: Secondary | ICD-10-CM

## 2023-04-03 LAB — PSA: Prostate Specific Ag, Serum: 0.6 ng/mL (ref 0.0–4.0)

## 2023-04-04 NOTE — Progress Notes (Signed)
 04/07/23 10:48 PM   Mark Hernandez. March 09, 1960 [id number]  Referring provider:  Joaquim Nam, MD [address] [place],  [place] [address]  Urological history  1. BPH with LU TS - PSA (03/2023) 0.6   2. Family history of prostate cancer - father and paternal father with prostate cancer   3. Nephrolithiasis - last incident in 2010  4.  Hydroceles -scrotal US (2022) - Small bilateral hydroceles  Chief Complaint  Patient presents with   Follow-up    HPI: Mark Hernandez. is a 63 y.o.male who presents today for a 1 year follow-up.    Previous records reviewed.     I PSS 14/1  He has no urinary complaints at this time.  Patient denies any modifying or aggravating factors.  Patient denies any recent UTI's, gross hematuria, dysuria or suprapubic/flank pain.  Patient denies any fevers, chills, nausea or vomiting.     IPSS     Row Name 04/05/23 1000         International Prostate Symptom Score   How often have you had the sensation of not emptying your bladder? Less than half the time     How often have you had to urinate less than every two hours? Less than half the time     How often have you found you stopped and started again several times when you urinated? Less than half the time     How often have you found it difficult to postpone urination? Less than half the time     How often have you had a weak urinary stream? Less than half the time     How often have you had to strain to start urination? Less than half the time     How many times did you typically get up at night to urinate? 2 Times     Total IPSS Score 14       Quality of Life due to urinary symptoms   If you were to spend the rest of your life with your urinary condition just the way it is now how would you feel about that? Pleased                Score:  1-7 Mild 8-19 Moderate 20-35 Severe     PMH: Past Medical History:  Diagnosis Date   Anxiety    Arthritis    BOTH HANDS    BPH (benign prostatic hypertrophy)    Elevated PSA    History of kidney stones    History of methicillin resistant staphylococcus aureus (MRSA) 2008   HLD (hyperlipidemia)    Nocturia    Over weight    Patellar clunk syndrome, right    Pulmonary nodule    Rotator cuff tear    Seizures (HCC) 2017   last seizure was 2017    Surgical History: Past Surgical History:  Procedure Laterality Date   COLONOSCOPY  2014?   INGUINAL HERNIA REPAIR Left 02/17/2015   Procedure: LAPAROSCOPIC INGUINAL HERNIA;  Surgeon: Kieth Brightly, MD;  Location: [facility];  Service: General;  Laterality: Left;   KNEE ARTHROSCOPY Right 02/14/2023   Procedure: Right knee arthroscopy with scar tissue resection;  Surgeon: Reinaldo Berber, MD;  Location: [facility];  Service: Orthopedics;  Laterality: Right;   KNEE ARTHROSCOPY WITH MEDIAL MENISECTOMY Right 01/13/2019   Procedure: KNEE ARTHROSCOPY WITH MEDIAL MENISECTOMY;  Surgeon: Juanell Fairly, MD;  Location: [facility];  Service: Orthopedics;  Laterality: Right;  LITHOTRIPSY     x 2   SHOULDER ARTHROSCOPY WITH OPEN ROTATOR CUFF REPAIR Left 09/23/2014   Procedure: SHOULDER ARTHROSCOPY , debridement, decompression, WITH  mini OPEN ROTATOR CUFF REPAIR;  Surgeon: Christena Flake, MD;  Location: [facility];  Service: Orthopedics;  Laterality: Left;   SHOULDER SURGERY Bilateral    rotator cuff repair   TOTAL KNEE ARTHROPLASTY Right 05/09/2021   Procedure: TOTAL KNEE ARTHROPLASTY;  Surgeon: Juanell Fairly, MD;  Location: [facility];  Service: Orthopedics;  Laterality: Right;   UMBILICAL HERNIA REPAIR N/A 02/17/2015   Procedure: LAPAROSCOPIC UMBILICAL HERNIA;  Surgeon: Kieth Brightly, MD;  Location: [facility];  Service: General;  Laterality: N/A;    Home Medications:  Allergies as of 04/05/2023       Reactions   Penicillins Rash   As a child        Medication List        Accurate as of April 05, 2023 11:59 PM. If you have any questions, ask your nurse or  doctor.          STOP taking these medications    albuterol 108 (90 Base) MCG/ACT inhaler Commonly known as: VENTOLIN HFA   benzonatate 100 MG capsule Commonly known as: TESSALON   fluticasone 50 MCG/ACT nasal spray Commonly known as: FLONASE   promethazine-dextromethorphan 6.25-15 MG/5ML syrup Commonly known as: PROMETHAZINE-DM   traMADol 50 MG tablet Commonly known as: Ultram       TAKE these medications    acetaminophen 500 MG tablet Commonly known as: TYLENOL Take 1,000 mg by mouth at bedtime.   atorvastatin 10 MG tablet Commonly known as: LIPITOR Take 1 tablet (10 mg total) by mouth daily. What changed: when to take this   escitalopram 10 MG tablet Commonly known as: LEXAPRO Take 1 tablet (10 mg total) by mouth daily. What changed: when to take this   Lacosamide 150 MG Tabs Take 1 tablet (150 mg total) by mouth 2 (two) times daily.   OVER THE COUNTER MEDICATION Take 1 capsule by mouth daily. COMFORT GUARD x24 supplement        Allergies:  Allergies  Allergen Reactions   Penicillins Rash    As a child     Family History: Family History  Problem Relation Age of Onset   COPD Mother    Lupus Mother    Prostate cancer Father    Colon cancer Paternal Grandmother    Kidney disease Paternal Uncle    Kidney cancer Paternal Uncle     Social History:  reports that he has never smoked. He quit smokeless tobacco use about 18 years ago.  His smokeless tobacco use included chew. He reports current alcohol use. He reports that he does not use drugs.   Physical Exam: BP 129/73   Pulse 73   Constitutional:  Well nourished. Alert and oriented, No acute distress. HEENT: [place] AT, moist mucus membranes.  Trachea midline Cardiovascular: No clubbing, cyanosis, or edema. Respiratory: Normal respiratory effort, no increased work of breathing. Neurologic: Grossly intact, no focal deficits, moving all 4 extremities. Psychiatric: Normal mood and affect.    Laboratory Data:    Latest Ref Rng & Units 09/13/2022    7:47 AM 09/11/2021    7:39 AM 05/10/2021    4:52 AM  CBC  WBC 4.0 - 10.5 K/uL 5.4  4.5  13.4   Hemoglobin 13.0 - 17.0 g/dL 16.1  09.6  04.5   Hematocrit 39.0 - 52.0 % 40.7  39.6  32.0   Platelets 150.0 - 400.0 K/uL 213.0  194.0  174        Latest Ref Rng & Units 09/13/2022    7:47 AM 09/11/2021    7:39 AM 05/10/2021    4:52 AM  CMP  Glucose 70 - 99 mg/dL 95  96  161   BUN 6 - 23 mg/dL 19  18  21    Creatinine 0.40 - 1.50 mg/dL 0.96  0.45  4.09   Sodium 135 - 145 mEq/L 138  139  137   Potassium 3.5 - 5.1 mEq/L 4.3  4.2  4.2   Chloride 96 - 112 mEq/L 104  105  105   CO2 19 - 32 mEq/L 28  25  26    Calcium 8.4 - 10.5 mg/dL 9.7  9.3  8.7   Total Protein 6.0 - 8.3 g/dL 7.3  7.2    Total Bilirubin 0.2 - 1.2 mg/dL 0.6  0.4    Alkaline Phos 39 - 117 U/L 72  68    AST 0 - 37 U/L 18  16    ALT 0 - 53 U/L 12  12    I have reviewed the labs.   Pertinent Imaging N/A  Assessment & Plan:    1. BPH with LU TS -PSA stable -Continue conservative management, avoiding bladder irritants and timed voiding's   2. Family history of prostate cancer: - Patient's father and paternal grandfather had been diagnosed with prostate cancer.   - PSA has remained stable - Rectal exam was reassuring today  - Patient will continue to have yearly prostate exams and PSA's  3.  Bilateral hydroceles -not bothersome   Return in about 1 year (around 04/04/2024) for IPSS, PSA and exam.  Michiel Cowboy, PA-C  [facility] [address] [place], [place] [address] [phone]

## 2023-04-05 ENCOUNTER — Ambulatory Visit: Payer: 59 | Admitting: Urology

## 2023-04-05 VITALS — BP 129/73 | HR 73

## 2023-04-05 DIAGNOSIS — N433 Hydrocele, unspecified: Secondary | ICD-10-CM | POA: Diagnosis not present

## 2023-04-05 DIAGNOSIS — N138 Other obstructive and reflux uropathy: Secondary | ICD-10-CM | POA: Diagnosis not present

## 2023-04-05 DIAGNOSIS — Z8042 Family history of malignant neoplasm of prostate: Secondary | ICD-10-CM | POA: Diagnosis not present

## 2023-04-05 DIAGNOSIS — N401 Enlarged prostate with lower urinary tract symptoms: Secondary | ICD-10-CM

## 2023-04-07 ENCOUNTER — Encounter: Payer: Self-pay | Admitting: Urology

## 2023-05-02 ENCOUNTER — Other Ambulatory Visit: Payer: Self-pay

## 2023-05-02 ENCOUNTER — Ambulatory Visit
Admission: RE | Admit: 2023-05-02 | Discharge: 2023-05-02 | Disposition: A | Discharge status: Home / Self Care | Source: Ambulatory Visit | Attending: Emergency Medicine | Admitting: Emergency Medicine

## 2023-05-02 VITALS — BP 145/78 | HR 87 | Temp 99.0°F | Resp 18

## 2023-05-02 DIAGNOSIS — J209 Acute bronchitis, unspecified: Secondary | ICD-10-CM | POA: Diagnosis not present

## 2023-05-02 MED ORDER — PROMETHAZINE-DM 6.25-15 MG/5ML PO SYRP: 5.0000 mL | ORAL_SOLUTION | Freq: Every evening | ORAL | 0 refills | Status: DC | PRN

## 2023-05-02 MED ORDER — MOXIFLOXACIN HCL 0.5 % OP SOLN: 1.0000 [drp] | Freq: Three times a day (TID) | OPHTHALMIC | 0 refills | Status: DC

## 2023-05-02 MED ORDER — PREDNISONE 10 MG (21) PO TBPK: ORAL_TABLET | Freq: Every day | ORAL | 0 refills | Status: DC

## 2023-05-02 MED ORDER — AZITHROMYCIN 250 MG PO TABS: 250.0000 mg | ORAL_TABLET | Freq: Every day | ORAL | 0 refills | Status: DC

## 2023-05-02 MED ORDER — BENZONATATE 100 MG PO CAPS: 100.0000 mg | ORAL_CAPSULE | Freq: Three times a day (TID) | ORAL | 0 refills | Status: DC

## 2023-05-02 NOTE — Discharge Instructions (Signed)
 Today you are being treated for bronchitis which is a inflammatory process of the upper airway  Begin azithromycin prophylactically to prevent symptoms from worsening to more serious lung condition such as pneumonia  Begin prednisone every morning with food as directed to help reduce chest tightness by opening relaxing the airway  You may use Tessalon pill every 8 hours as needed for cough, may use cough syrup at bedtime for additional comfort    You can take Tylenol and/or Ibuprofen as needed for fever reduction and pain relief.   For cough: honey 1/2 to 1 teaspoon (you can dilute the honey in water or another fluid).  You can also use guaifenesin and dextromethorphan for cough. You can use a humidifier for chest congestion and cough.  If you don't have a humidifier, you can sit in the bathroom with the hot shower running.      For sore throat: try warm salt water gargles, cepacol lozenges, throat spray, warm tea or water with lemon/honey, popsicles or ice, or OTC cold relief medicine for throat discomfort.   For congestion: take a daily anti-histamine like Zyrtec, Claritin, and a oral decongestant, such as pseudoephedrine.  You can also use Flonase 1-2 sprays in each nostril daily.   It is important to stay hydrated: drink plenty of fluids (water, gatorade/powerade/pedialyte, juices, or teas) to keep your throat moisturized and help further relieve irritation/discomfort.

## 2023-05-02 NOTE — ED Triage Notes (Signed)
 Patient presents to [facility] for evaluation of sore throat, nasal congestion, and tightness in chest since Friday.

## 2023-05-02 NOTE — ED Provider Notes (Signed)
 Renaldo Fiddler    CSN: [id number] Arrival date & time: 05/02/23  1011      History   Chief Complaint Chief Complaint  Patient presents with   Fever    Fever since Friday. OTC flu and COVID test neg.  Congestion, cough, and sinus pressure. - Entered by patient    HPI Yitzchok Carriger. is a 63 y.o. male.   Patient presents for evaluation of fever ranging between 99-100, intermittent headaches, sore throat, nasal congestion, rhinorrhea, productive cough, centralized chest tightness present for 5 days.  Also been experiencing bilateral eye erythema, itching and pruritus.  Has attempted use of Mucinex, Tylenol and over-the-counter cough syrup.  Denies shortness of breath and wheezing.  History of bronchitis as well as worked as a IT sales professional.   Past Medical History:  Diagnosis Date   Anxiety    Arthritis    BOTH HANDS   BPH (benign prostatic hypertrophy)    Elevated PSA    History of kidney stones    History of methicillin resistant staphylococcus aureus (MRSA) 2008   HLD (hyperlipidemia)    Nocturia    Over weight    Patellar clunk syndrome, right    Pulmonary nodule    Rotator cuff tear    Seizures (HCC) 2017   last seizure was 2017    Patient Active Problem List   Diagnosis Date Noted   S/P TKR (total knee replacement) using cement, right 05/09/2021   Radicular pain in right arm 09/02/2019   Right knee pain 10/26/2018   HLD (hyperlipidemia) 08/25/2017   Anxiety 08/25/2017   Pulmonary nodule 08/25/2017   BPH with obstruction/lower urinary tract symptoms 03/18/2015   Family history of prostate cancer 03/18/2015   Routine general medical examination at a health care facility 12/31/2014   Advance care planning 12/31/2014   Injury of left shoulder 05/05/2013   Seizures (HCC) 05/04/2013   Syncope and collapse 05/04/2013    Past Surgical History:  Procedure Laterality Date   COLONOSCOPY  2014?   INGUINAL HERNIA REPAIR Left 02/17/2015   Procedure: LAPAROSCOPIC  INGUINAL HERNIA;  Surgeon: Kieth Brightly, MD;  Location: [facility];  Service: General;  Laterality: Left;   KNEE ARTHROSCOPY Right 02/14/2023   Procedure: Right knee arthroscopy with scar tissue resection;  Surgeon: Reinaldo Berber, MD;  Location: [facility];  Service: Orthopedics;  Laterality: Right;   KNEE ARTHROSCOPY WITH MEDIAL MENISECTOMY Right 01/13/2019   Procedure: KNEE ARTHROSCOPY WITH MEDIAL MENISECTOMY;  Surgeon: Juanell Fairly, MD;  Location: [facility];  Service: Orthopedics;  Laterality: Right;   LITHOTRIPSY     x 2   SHOULDER ARTHROSCOPY WITH OPEN ROTATOR CUFF REPAIR Left 09/23/2014   Procedure: SHOULDER ARTHROSCOPY , debridement, decompression, WITH  mini OPEN ROTATOR CUFF REPAIR;  Surgeon: Christena Flake, MD;  Location: [facility];  Service: Orthopedics;  Laterality: Left;   SHOULDER SURGERY Bilateral    rotator cuff repair   TOTAL KNEE ARTHROPLASTY Right 05/09/2021   Procedure: TOTAL KNEE ARTHROPLASTY;  Surgeon: Juanell Fairly, MD;  Location: [facility];  Service: Orthopedics;  Laterality: Right;   UMBILICAL HERNIA REPAIR N/A 02/17/2015   Procedure: LAPAROSCOPIC UMBILICAL HERNIA;  Surgeon: Kieth Brightly, MD;  Location: [facility];  Service: General;  Laterality: N/A;       Home Medications    Prior to Admission medications   Medication Sig Start Date End Date Taking? Authorizing Provider  azithromycin (ZITHROMAX) 250 MG tablet Take 1 tablet (250 mg total) by mouth daily. Take  first 2 tablets together, then 1 every day until finished. 05/02/23  Yes Demarrius Guerrero R, NP  benzonatate (TESSALON) 100 MG capsule Take 1 capsule (100 mg total) by mouth every 8 (eight) hours. 05/02/23  Yes Berneice Zettlemoyer R, NP  predniSONE (STERAPRED UNI-PAK 21 TAB) 10 MG (21) TBPK tablet Take by mouth daily. Take 6 tabs by mouth daily  for 1 days, then 5 tabs for 1 days, then 4 tabs for 1 days, then 3 tabs for 1 days, 2 tabs for 1 days, then 1 tab by mouth daily for 1 days 05/02/23  Yes Achol Azpeitia,  Kyan Yurkovich R, NP  promethazine-dextromethorphan (PROMETHAZINE-DM) 6.25-15 MG/5ML syrup Take 5 mLs by mouth at bedtime as needed. 05/02/23  Yes Marykathleen Russi R, NP  acetaminophen (TYLENOL) 500 MG tablet Take 1,000 mg by mouth at bedtime.    [provider]  atorvastatin (LIPITOR) 10 MG tablet Take 1 tablet (10 mg total) by mouth daily. Patient taking differently: Take 10 mg by mouth at bedtime. 09/20/22   Joaquim Nam, MD  escitalopram (LEXAPRO) 10 MG tablet Take 1 tablet (10 mg total) by mouth daily. Patient taking differently: Take 10 mg by mouth every morning. 09/20/22   Joaquim Nam, MD  Lacosamide 150 MG TABS Take 1 tablet (150 mg total) by mouth 2 (two) times daily. 10/15/22   Butch Penny, NP  OVER THE COUNTER MEDICATION Take 1 capsule by mouth daily. COMFORT GUARD x24 supplement    [provider]    Family History Family History  Problem Relation Age of Onset   COPD Mother    Lupus Mother    Prostate cancer Father    Colon cancer Paternal Grandmother    Kidney disease Paternal Uncle    Kidney cancer Paternal Uncle     Social History Social History   Tobacco Use   Smoking status: Never   Smokeless tobacco: Former    Types: Chew    Quit date: 05/07/2004  Vaping Use   Vaping status: Never Used  Substance Use Topics   Alcohol use: Yes    Comment: occ   Drug use: No     Allergies   Penicillins   Review of Systems Review of Systems  Constitutional:  Positive for fever.     Physical Exam Triage Vital Signs ED Triage Vitals  Encounter Vitals Group     BP 05/02/23 1033 (!) 145/78     Systolic BP Percentile --      Diastolic BP Percentile --      Pulse Rate 05/02/23 1033 87     Resp 05/02/23 1033 18     Temp 05/02/23 1033 99 F (37.2 C)     Temp Source 05/02/23 1033 Oral     SpO2 05/02/23 1033 93 %     Weight --      Height --      Head Circumference --      Peak Flow --      Pain Score 05/02/23 1034 7     Pain Loc --      Pain  Education --      Exclude from Growth Chart --    No data found.  Updated Vital Signs BP (!) 145/78 (BP Location: Left Arm)   Pulse 87   Temp 99 F (37.2 C) (Oral)   Resp 18   SpO2 93%   Visual Acuity Right Eye Distance:   Left Eye Distance:   Bilateral Distance:    Right Eye  Near:   Left Eye Near:    Bilateral Near:     Physical Exam Constitutional:      Appearance: Normal appearance.  HENT:     Head: Normocephalic.     Right Ear: Tympanic membrane, ear canal and external ear normal.     Left Ear: Tympanic membrane, ear canal and external ear normal.     Nose: Congestion present.     Right Turbinates: Swollen.     Left Turbinates: Swollen.     Mouth/Throat:     Mouth: Mucous membranes are moist.     Pharynx: Oropharynx is clear. Posterior oropharyngeal erythema present. No oropharyngeal exudate.  Eyes:     Extraocular Movements: Extraocular movements intact.  Cardiovascular:     Rate and Rhythm: Normal rate and regular rhythm.     Pulses: Normal pulses.     Heart sounds: Normal heart sounds.  Pulmonary:     Effort: Pulmonary effort is normal.     Breath sounds: Normal breath sounds.  Musculoskeletal:     Cervical back: Normal range of motion and neck supple.  Neurological:     Mental Status: He is alert and oriented to person, place, and time. Mental status is at baseline.      UC Treatments / Results  Labs (all labs ordered are listed, but only abnormal results are displayed) Labs Reviewed - No data to display  EKG   Radiology No results found.  Procedures Procedures (including critical care time)  Medications Ordered in UC Medications - No data to display  Initial Impression / Assessment and Plan / UC Course  I have reviewed the triage vital signs and the nursing notes.  Pertinent labs & imaging results that were available during my care of the patient were reviewed by me and considered in my medical decision making (see chart for  details).  Bronchitis Patient is in no signs of distress nor toxic appearing.  Vital signs are stable.  Low suspicion for pneumonia, imaging deferred.  Viral testing deferred due to timeline of illness.  Prophylactically providing bacterial coverage with azithromycin, treating symptoms with prednisone, Tessalon and Promethazine DM.  Prescribed moxifloxacin for management advised.May use additional over-the-counter medications as needed for supportive care.  May follow-up with urgent care as needed if symptoms persist or worsen.   Final Clinical Impressions(s) / UC Diagnoses   Final diagnoses:  Acute bronchitis, unspecified organism     Discharge Instructions      Today you are being treated for bronchitis which is a inflammatory process of the upper airway  Begin azithromycin prophylactically to prevent symptoms from worsening to more serious lung condition such as pneumonia  Begin prednisone every morning with food as directed to help reduce chest tightness by opening relaxing the airway  You may use Tessalon pill every 8 hours as needed for cough, may use cough syrup at bedtime for additional comfort    You can take Tylenol and/or Ibuprofen as needed for fever reduction and pain relief.   For cough: honey 1/2 to 1 teaspoon (you can dilute the honey in water or another fluid).  You can also use guaifenesin and dextromethorphan for cough. You can use a humidifier for chest congestion and cough.  If you don't have a humidifier, you can sit in the bathroom with the hot shower running.      For sore throat: try warm salt water gargles, cepacol lozenges, throat spray, warm tea or water with lemon/honey, popsicles or ice, or OTC cold  relief medicine for throat discomfort.   For congestion: take a daily anti-histamine like Zyrtec, Claritin, and a oral decongestant, such as pseudoephedrine.  You can also use Flonase 1-2 sprays in each nostril daily.   It is important to stay hydrated: drink  plenty of fluids (water, gatorade/powerade/pedialyte, juices, or teas) to keep your throat moisturized and help further relieve irritation/discomfort.    ED Prescriptions     Medication Sig Dispense Auth. Provider   azithromycin (ZITHROMAX) 250 MG tablet Take 1 tablet (250 mg total) by mouth daily. Take first 2 tablets together, then 1 every day until finished. 6 tablet Keiyana Stehr R, NP   predniSONE (STERAPRED UNI-PAK 21 TAB) 10 MG (21) TBPK tablet Take by mouth daily. Take 6 tabs by mouth daily  for 1 days, then 5 tabs for 1 days, then 4 tabs for 1 days, then 3 tabs for 1 days, 2 tabs for 1 days, then 1 tab by mouth daily for 1 days 21 tablet Lilley Hubble R, NP   benzonatate (TESSALON) 100 MG capsule Take 1 capsule (100 mg total) by mouth every 8 (eight) hours. 21 capsule Lener Ventresca R, NP   promethazine-dextromethorphan (PROMETHAZINE-DM) 6.25-15 MG/5ML syrup Take 5 mLs by mouth at bedtime as needed. 118 mL Kalee Mcclenathan, Elita Boone, NP      PDMP not reviewed this encounter.   Valinda Hoar, NP 05/02/23 1056

## 2023-05-12 ENCOUNTER — Other Ambulatory Visit: Payer: Self-pay | Admitting: Adult Health

## 2023-05-12 DIAGNOSIS — R569 Unspecified convulsions: Secondary | ICD-10-CM

## 2023-05-14 ENCOUNTER — Encounter: Payer: Self-pay | Admitting: Adult Health

## 2023-05-14 ENCOUNTER — Other Ambulatory Visit: Payer: Self-pay

## 2023-05-14 DIAGNOSIS — R569 Unspecified convulsions: Secondary | ICD-10-CM

## 2023-05-14 NOTE — Telephone Encounter (Signed)
 Pt is calling to f/u on the status of his medication being filled, please call with an update. Pt took his last of the LACOSAMIDE on yesterday.

## 2023-05-15 NOTE — Telephone Encounter (Signed)
 We have an electronic receipt from [facility] [phone] stating Lacosamide 150 mg BID #60 with 5 refills received. I called [facility] to confirm they will fill for him asap and they told me they cannot see it. I called in the prescription over the phone. They will get it ready for him right away.

## 2023-05-16 NOTE — Telephone Encounter (Signed)
 In response to your message

## 2023-05-20 NOTE — Telephone Encounter (Signed)
 I had a very pleasant conversation with the patient. He received his refill last Wednesday.S I called him and let him know that Megan NP messaged him back in MyChart which he can read later and that she had apologized this happened with his prescription and she was out of the office last week.  I let the patient know that I have spoken with the phone staff to ensure that phone calls from patients to check on refills are routed in a separate phone call queue rather than refill queue.  I also encouraged the patient to feel free to send us  a MyChart message which goes directly to the clinical staff if he is waiting on a refill.  If he needs a refill on the weekend, he is welcome to call our on-call doctor.  I also let him know that I found out from the pharmacy that currently he can only refill his medication 2 days in advance since it is controlled.  However, we can change the prescription instructions to refill every 25 days. We will do this for him.  Therefore, we encourage the patient to try to request future refills a week in advance and that way if the 6 refills have been used, the pharmacy could send a request to us  sooner and we can help get this resolved before he runs out. I told the patient if he still desires for PCP to fill, that would be ok. The patient verbalized appreciation for "working hard" on this and said for now he wants to leave things here as is but would address it again if its more than 2 business days in the future.

## 2023-05-21 MED ORDER — LACOSAMIDE 150 MG PO TABS: 1.0000 | ORAL_TABLET | Freq: Two times a day (BID) | ORAL | 5 refills | Status: DC

## 2023-07-03 IMAGING — DX DG KNEE 1-2V*R*
2 series · 2 of 2 positions shown · non-contrast
Comparison: Right knee radiographs 10/24/2018

CLINICAL DATA: Postop right knee replacement.

EXAM:
RIGHT KNEE - 1-2 VIEW

[knee ap]
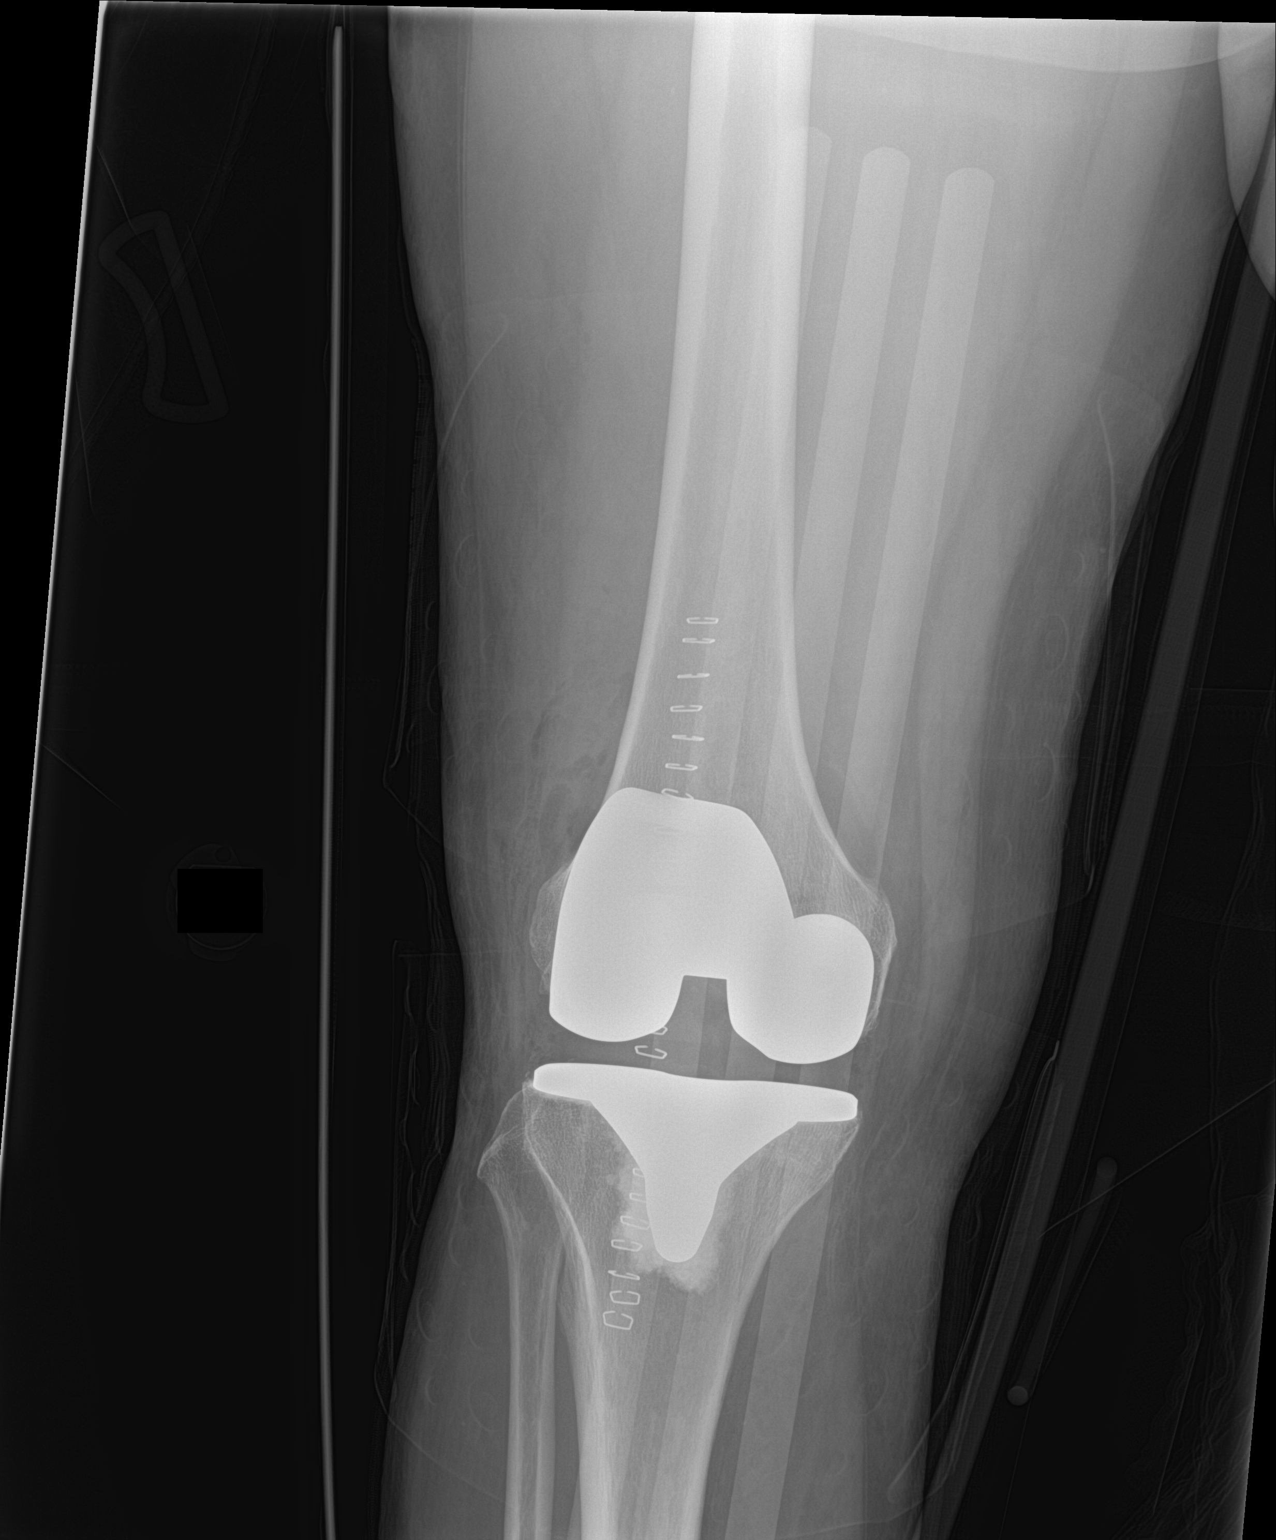

[knee lat]
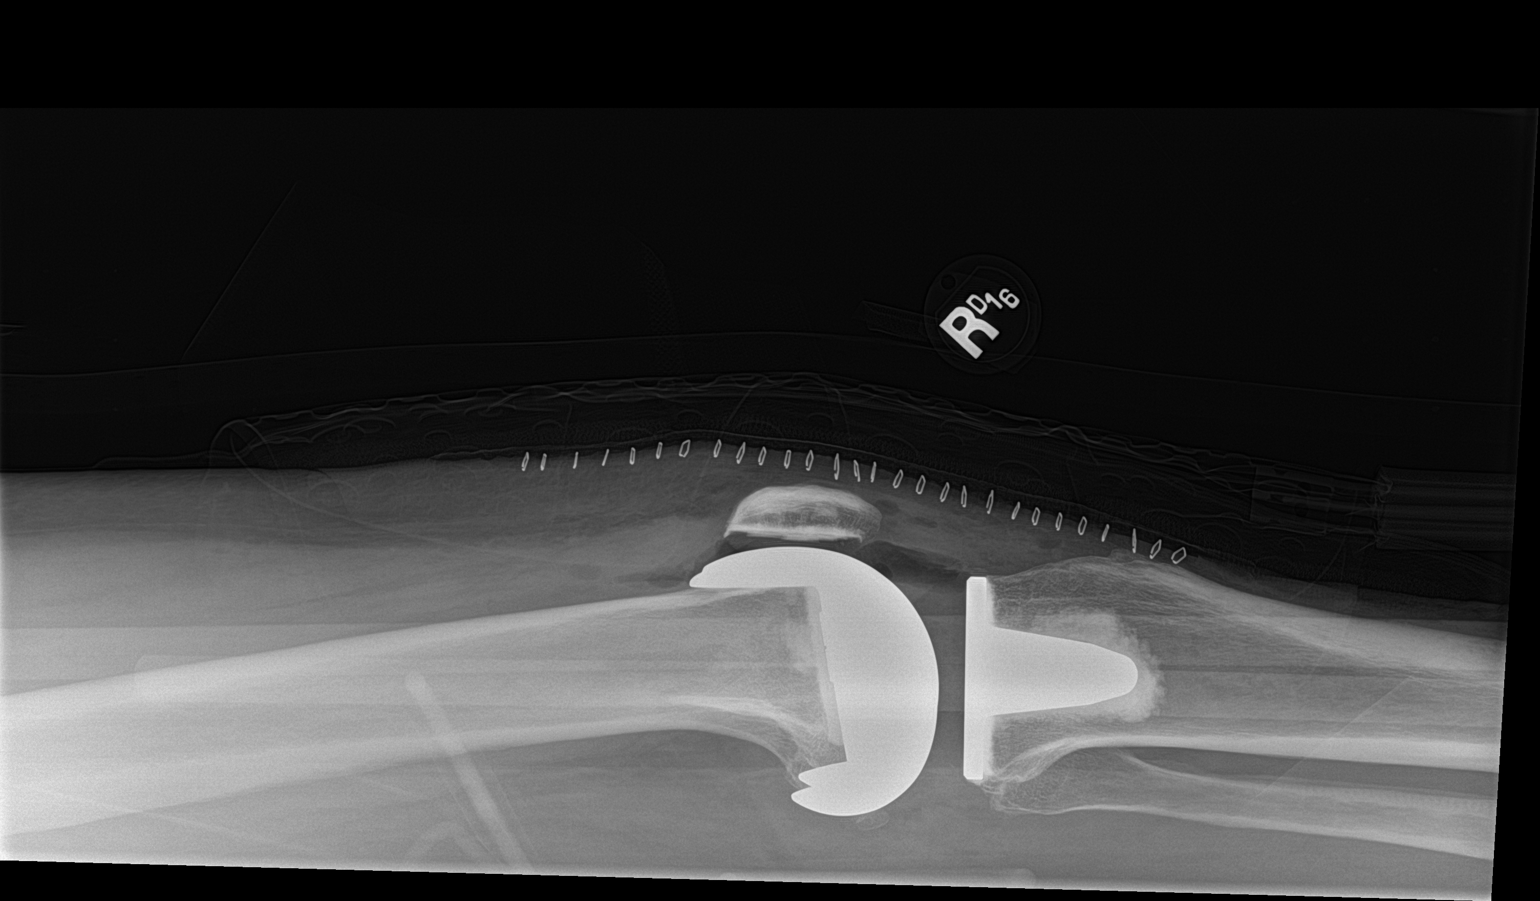

[2 of 2 positions shown; findings below may reference images not displayed]

FINDINGS: Sequelae of total knee arthroplasty are identified. The prosthetic
components appear normally located, and no acute fracture is
identified. Postoperative gas is noted in the knee joint and
surrounding soft tissues, and skin staples are in place.
IMPRESSION: Right total knee arthroplasty without evidence of acute osseous
abnormality.

## 2023-08-12 ENCOUNTER — Ambulatory Visit: Payer: Self-pay

## 2023-08-12 DIAGNOSIS — Z1211 Encounter for screening for malignant neoplasm of colon: Secondary | ICD-10-CM | POA: Diagnosis present

## 2023-08-12 DIAGNOSIS — K635 Polyp of colon: Secondary | ICD-10-CM | POA: Diagnosis not present

## 2023-09-01 ENCOUNTER — Ambulatory Visit: Payer: Self-pay | Admitting: Family Medicine

## 2023-09-10 ENCOUNTER — Other Ambulatory Visit: Payer: Self-pay | Admitting: Family Medicine

## 2023-09-16 ENCOUNTER — Other Ambulatory Visit: Payer: Self-pay | Admitting: Family Medicine

## 2023-09-16 DIAGNOSIS — E785 Hyperlipidemia, unspecified: Secondary | ICD-10-CM

## 2023-09-17 ENCOUNTER — Other Ambulatory Visit: Payer: 59

## 2023-09-20 ENCOUNTER — Other Ambulatory Visit (INDEPENDENT_AMBULATORY_CARE_PROVIDER_SITE_OTHER)

## 2023-09-20 DIAGNOSIS — E785 Hyperlipidemia, unspecified: Secondary | ICD-10-CM | POA: Diagnosis not present

## 2023-09-20 LAB — CBC WITH DIFFERENTIAL/PLATELET
Basophils Absolute: 0 K/uL (ref 0.0–0.1)
Basophils Relative: 0.8 % (ref 0.0–3.0)
Eosinophils Absolute: 0.2 K/uL (ref 0.0–0.7)
Eosinophils Relative: 4.3 % (ref 0.0–5.0)
HCT: 41.9 % (ref 39.0–52.0)
Hemoglobin: 13.9 g/dL (ref 13.0–17.0)
Lymphocytes Relative: 20.1 % (ref 12.0–46.0)
Lymphs Abs: 0.9 K/uL (ref 0.7–4.0)
MCHC: 33.2 g/dL (ref 30.0–36.0)
MCV: 90.7 fl (ref 78.0–100.0)
Monocytes Absolute: 0.5 K/uL (ref 0.1–1.0)
Monocytes Relative: 11.9 % (ref 3.0–12.0)
Neutro Abs: 2.9 K/uL (ref 1.4–7.7)
Neutrophils Relative %: 62.9 % (ref 43.0–77.0)
Platelets: 207 K/uL (ref 150.0–400.0)
RBC: 4.62 Mil/uL (ref 4.22–5.81)
RDW: 13.2 % (ref 11.5–15.5)
WBC: 4.6 K/uL (ref 4.0–10.5)

## 2023-09-20 LAB — COMPREHENSIVE METABOLIC PANEL WITH GFR
ALT: 11 U/L (ref 0–53)
AST: 17 U/L (ref 0–37)
Albumin: 4.2 g/dL (ref 3.5–5.2)
Alkaline Phosphatase: 77 U/L (ref 39–117)
BUN: 17 mg/dL (ref 6–23)
CO2: 27 meq/L (ref 19–32)
Calcium: 9.1 mg/dL (ref 8.4–10.5)
Chloride: 105 meq/L (ref 96–112)
Creatinine, Ser: 0.75 mg/dL (ref 0.40–1.50)
GFR: 96.5 mL/min (ref >60.00)
Glucose, Bld: 96 mg/dL (ref 70–99)
Potassium: 4.3 meq/L (ref 3.5–5.1)
Sodium: 140 meq/L (ref 135–145)
Total Bilirubin: 0.6 mg/dL (ref 0.2–1.2)
Total Protein: 7 g/dL (ref 6.0–8.3)

## 2023-09-20 LAB — LIPID PANEL
Cholesterol: 148 mg/dL (ref 0–200)
HDL: 44.9 mg/dL (ref >39.00)
LDL Cholesterol: 79 mg/dL (ref 0–99)
NonHDL: 103.14
Total CHOL/HDL Ratio: 3
Triglycerides: 120 mg/dL (ref 0.0–149.0)
VLDL: 24 mg/dL (ref 0.0–40.0)

## 2023-09-22 ENCOUNTER — Ambulatory Visit: Payer: Self-pay | Admitting: Family Medicine

## 2023-09-24 ENCOUNTER — Encounter: Payer: Self-pay | Admitting: Family Medicine

## 2023-09-24 ENCOUNTER — Ambulatory Visit (INDEPENDENT_AMBULATORY_CARE_PROVIDER_SITE_OTHER): Payer: 59 | Admitting: Family Medicine

## 2023-09-24 VITALS — BP 138/78 | HR 72 | Temp 98.2°F | Ht 68.27 in | Wt 222.2 lb

## 2023-09-24 DIAGNOSIS — Z7189 Other specified counseling: Secondary | ICD-10-CM

## 2023-09-24 DIAGNOSIS — Z0000 Encounter for general adult medical examination without abnormal findings: Secondary | ICD-10-CM

## 2023-09-24 DIAGNOSIS — R911 Solitary pulmonary nodule: Secondary | ICD-10-CM

## 2023-09-24 DIAGNOSIS — E785 Hyperlipidemia, unspecified: Secondary | ICD-10-CM

## 2023-09-24 DIAGNOSIS — F419 Anxiety disorder, unspecified: Secondary | ICD-10-CM

## 2023-09-24 DIAGNOSIS — R569 Unspecified convulsions: Secondary | ICD-10-CM

## 2023-09-24 MED ORDER — ESCITALOPRAM OXALATE 10 MG PO TABS: 10.0000 mg | ORAL_TABLET | Freq: Every day | ORAL | 3 refills | Status: AC

## 2023-09-24 MED ORDER — KETOTIFEN FUMARATE 0.035 % OP SOLN: 1.0000 [drp] | Freq: Two times a day (BID) | OPHTHALMIC | Status: AC | PRN

## 2023-09-24 MED ORDER — ATORVASTATIN CALCIUM 10 MG PO TABS: 10.0000 mg | ORAL_TABLET | Freq: Every day | ORAL | 3 refills | Status: AC

## 2023-09-24 NOTE — Progress Notes (Unsigned)
 CPE- See plan.  Routine anticipatory guidance given to patient.  See health maintenance.  The possibility exists that previously documented standard health maintenance information may have been brought forward from a previous encounter into this note.  If needed, that same information has been updated to reflect the current situation based on today's encounter.    Tetanus done 2021 Flu done yearly, d/w pt.   PNA d/w pt.   shingles d/w pt.   covid vaccine prev done.   PSA screening- done by uro, I'll defer, d/w pt.   Colonoscopy done 2025 Living will d/w pt. Wife designated if patient were incapacitated. Diet and exercise d/w pt. Encouraged both.  HCV and HIV screening prev neg. Labs d/w pt.   D/w pt about zaditor  if needed for itching eyes.     He has chronic pigmentation changes on the R medial shin.  Local varicose veins.  Some occ BLE edema but not present now.  No ulceration.  No claudication.    R knee is better in the meantime, after procedure this year.     D/w pt about pulmonary nodule hx with work related smoke exposure.  CT reordered 2025.  Needs to be set up later this year, ie December.     Elevated Cholesterol: Using medications without problems: yes Muscle aches: likely not from statin.   Diet compliance: d/w pt.   Exercise: yes   Mood d/w pt.  Mood is okay.  Retired from [employer].  Compliant. No SI/HI.  Still active.     Seizure hx per neuro.  No events.  Compliant.  Has neuro f/u pending.     PMH and SH reviewed  Meds, vitals, and allergies reviewed.   ROS: Per HPI.  Unless specifically indicated otherwise in HPI, the patient denies:  General: fever. Eyes: acute vision changes ENT: sore throat Cardiovascular: chest pain Respiratory: SOB GI: vomiting GU: dysuria Musculoskeletal: acute back pain Derm: acute rash Neuro: acute motor dysfunction Psych: worsening mood Endocrine: polydipsia Heme: bleeding Allergy: hayfever  GEN: nad, alert and  oriented HEENT: ncat NECK: supple w/o LA CV: rrr. PULM: ctab, no inc wob ABD: soft, +bs EXT: no edema SKIN: no acute rash but chronic R leg changes noted.

## 2023-09-24 NOTE — Patient Instructions (Signed)
 Check with your insurance to see if they will cover the shingles shot. Take care.  Glad to see you. I would get a flu shot each fall.   Try zaditor  for itching eyes.

## 2023-09-25 NOTE — Assessment & Plan Note (Signed)
 D/w pt about pulmonary nodule hx with work related smoke exposure.  CT ordered.  Needs to be set up later this year, ie December.

## 2023-09-25 NOTE — Assessment & Plan Note (Signed)
 Tetanus done 2021 Flu done yearly, d/w pt.   PNA d/w pt.   shingles d/w pt.   covid vaccine prev done.   PSA screening- done by uro, I'll defer, d/w pt.   Colonoscopy done 2025 Living will d/w pt. Wife designated if patient were incapacitated. Diet and exercise d/w pt. Encouraged both.  HCV and HIV screening prev neg. Labs d/w pt.

## 2023-09-25 NOTE — Assessment & Plan Note (Signed)
Continue work on diet and exercise.  Continue atorvastatin. 

## 2023-09-25 NOTE — Assessment & Plan Note (Signed)
 Living will d/w pt.  Wife designated if patient were incapacitated.   ?

## 2023-09-25 NOTE — Assessment & Plan Note (Signed)
 Per neurology.  No events.  Continue lacosamide .

## 2023-09-25 NOTE — Assessment & Plan Note (Signed)
 Mood is okay.  Retired from [employer].  Compliant. No SI/HI.  Still active.  Would continue Lexapro  as is.

## 2023-12-15 ENCOUNTER — Other Ambulatory Visit: Payer: Self-pay | Admitting: Adult Health

## 2023-12-15 DIAGNOSIS — R569 Unspecified convulsions: Secondary | ICD-10-CM

## 2023-12-18 ENCOUNTER — Encounter: Payer: Self-pay | Admitting: Adult Health

## 2023-12-18 DIAGNOSIS — R569 Unspecified convulsions: Secondary | ICD-10-CM

## 2023-12-18 MED ORDER — LACOSAMIDE 150 MG PO TABS
1.0000 | ORAL_TABLET | Freq: Two times a day (BID) | ORAL | 5 refills | Status: DC
Start: 2023-12-18 — End: 2023-12-19

## 2023-12-18 NOTE — Telephone Encounter (Signed)
 I called [facility] and spoke with pharmacist [name] I was able to give a verbal prescription on behalf of [name] NP for the Lacosamide  150 mg PO BID #60 with 5 refills. I requested a refill every 25 days as we have been told before that this was acceptable to avoid patient running out before Rx filled. I was told by the pharmacist that all controlled [facility] at [facility] are filled every 30 days and at best, 2 days early. I told her the patient is out of his medication and needs a refill today. She verbalized understanding and thanked me for the call.

## 2023-12-18 NOTE — Telephone Encounter (Signed)
 Pt called to follow up about medication refill Lacosamide  150 MG TABS  Pt ran out of medication Yesterday   Pt medication is to be sent to   [id number], [place] - [address] (Ph: [phone])

## 2023-12-18 NOTE — Telephone Encounter (Signed)
 Requested Prescriptions   Pending Prescriptions Disp Refills   Lacosamide  150 MG TABS [Pharmacy Med Name: LACOSAMIDE  150 MG TABLET] 60 tablet     Sig: TAKE 1 TABLET BY MOUTH TWICE A DAY   Last seen 12/18/22 Next appt 12/24/23 Last note stated:  Continue lacosamide  150 mg twice a day   Dispenses   Dispensed Days Supply Quantity Provider Pharmacy  LACOSAMIDE  150 MG TABLET 11/14/2023 30 60 each Millikan, Megan, NP [facility] [phone] - B...  LACOSAMIDE  150 MG TABLET 10/14/2023 30 60 each Ines Onetha NOVAK, MD [facility] [phone] - B...  LACOSAMIDE  150 MG TABLET 09/12/2023 30 60 each Ines Onetha NOVAK, MD [facility] [phone] - B...  LACOSAMIDE  150 MG TABLET 08/12/2023 30 60 each Ines Onetha NOVAK, MD [facility] [phone] - B...  LACOSAMIDE  150 MG TABLET 07/12/2023 30 60 each Ines Onetha NOVAK, MD [facility] [phone] - B...  LACOSAMIDE  150 MG TABLET 06/11/2023 30 60 each Ines Onetha NOVAK, MD [facility] [phone] - B...  LACOSAMIDE  150 MG TABLET 05/15/2023 30 60 each Ines Onetha NOVAK, MD [facility] [phone] - W...  LACOSAMIDE  150 MG TABLET 04/11/2023 30 60 each Millikan, Megan, NP [facility] [phone] - W...  LACOSAMIDE  150 MG TABLET 03/12/2023 30 60 each Sherryl Bouchard, NP [facility] [phone] - W...  LACOSAMIDE  150 MG TABLET 02/10/2023 30 60 each Millikan, Megan, NP [facility] [phone] - W...  LACOSAMIDE  150 MG TABLET 01/11/2023 30 60 each Millikan, Megan, NP [facility] [phone] - [address]

## 2023-12-19 ENCOUNTER — Telehealth: Payer: Self-pay | Admitting: Adult Health

## 2023-12-19 MED ORDER — LACOSAMIDE 150 MG PO TABS: 1.0000 | ORAL_TABLET | Freq: Two times a day (BID) | ORAL | 5 refills | Status: AC

## 2023-12-19 NOTE — Telephone Encounter (Signed)
 Late entry I called the patient this morning around 815.  [facility] advised that [facility] not [facility].  I advised the patient that we called them yesterday and did a verbal order for the prescription.  They never told us  that they did not have the medication in stock.  Also explained to the patient that we tried to electronically prescribe the prescription twice and it was not working on their end.  I told the patient multiple times in the past to reach out to our office through MyChart.  He can reach out when he has 1 refill left on his prescription bottle we will send the medication and so he does not have a lapse in medication.  Patient verbalized understanding.  He would like a prescription sent to [facility] pharmacy.  I called [place] pharmacy they did not have lacosamide  in stock it would be tomorrow before they can get it  [facility] which is where the prescription was sent yesterday.  Was advised that they have 30 tablets in stock.  This was not told to the patient when he called the day before.  I asked that they can give him a partial refill until the medication is available.  They advised that they could.  I called the patient made him aware.  He was very adult nurse.

## 2023-12-19 NOTE — Addendum Note (Signed)
 Addended by: SHERRYL DUWAINE SQUIBB on: 12/19/2023 02:09 PM   Modules accepted: Orders

## 2023-12-24 ENCOUNTER — Telehealth: Payer: 59 | Admitting: Adult Health

## 2023-12-24 DIAGNOSIS — R569 Unspecified convulsions: Secondary | ICD-10-CM | POA: Diagnosis not present

## 2023-12-24 NOTE — Patient Instructions (Signed)
 Continue Vimpat  Please call or send a MyChart message when you have 1 refill left on your bottle to ensure you get appropriate refills on time If you have any seizure events please let us  know.    SEIZURE PRECAUTIONS Per [place] statutes, patients with seizures are not allowed to drive until they have been seizure-free for six months.    Use caution when using heavy equipment or power tools. Avoid working on ladders or at heights. Take showers instead of baths. Ensure the water temperature is not too high on the home water heater. Do not go swimming alone. Do not lock yourself in a room alone (i.e. bathroom). When caring for infants or small children, sit down when holding, feeding, or changing them to minimize risk of injury to the child in the event you have a seizure. Maintain good sleep hygiene. Avoid alcohol.    If patient has another seizure, call 911 and bring them back to the ED if: A.  The seizure lasts longer than 5 minutes.      B.  The patient doesn't wake shortly after the seizure or has new problems such as difficulty seeing, speaking or moving following the seizure C.  The patient was injured during the seizure D.  The patient has a temperature over 102 F (39C) E.  The patient vomited during the seizure and now is having trouble breathing

## 2023-12-24 NOTE — Progress Notes (Signed)
 PATIENT: Mark Hernandez. DOB: 01/10/1961  REASON FOR VISIT: follow up HISTORY FROM: patient  Virtual Visit via Video Note  I connected with Mark Hernandez. on 12/24/23 at  3:15 PM EST by a video enabled telemedicine application located remotely at [facility] and verified that I am speaking with the correct person using two identifiers who was located at their own home.   I discussed the limitations of evaluation and management by telemedicine and the availability of in person appointments. The patient expressed understanding and agreed to proceed.   PATIENT: Mark Hernandez. DOB: 03/13/1960  REASON FOR VISIT: follow up HISTORY FROM: patient  HISTORY OF PRESENT ILLNESS: Today 12/24/23:  Mark Hernandez. is a 63 y.o. male with a history of seizures. Returns today for follow-up.  Patient denies any seizure events.  Recently had trouble getting his medication but he was able to finally get it.  Continues on Vimpat  150 mg twice a day.  Denies any significant side effects.   12/18/22: Mark Hernandez. is a 63 y.o. male with a history of seizures. Returns today for follow-up.  Denies any seizure events.  Remains on Vimpat  150 mg twice a day.  Able to complete all ADLs independently.  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  Returns today for an evaluation.  12/26/21: Mark Hernandez  is a 63 year old male with a history of seizures.  He returns today for follow-up.  He denies any seizure events.  Continues on Vimpat  150 mg twice a day.  Operates a motor vehicle without difficulty.  Able to complete ADLs independently.  Returns today for an evaluation.  12/20/20: Mark Hernandez is a 63 year old male with a history of seizures.  He returns today for follow-up.  He denies any seizure events.  He states that he is on the generic of Vimpat  now.  He reports that he has tolerated that well.  Continues to operate a motor vehicle without difficulty.  Returns today for  evaluation.  HISTORY 12/20/19: Mark Hernandez is a 63 year old male with a history of seizures.  He returns today for follow-up.  He denies any seizure events.  Continues on Vimpat  150 mg twice a day.  He is able to complete all ADLs independently.  Operates a librarian, academic.  Returns today for follow-up.  REVIEW OF SYSTEMS: Out of a complete 14 system review of symptoms, the patient complains only of the following symptoms, and all other reviewed systems are negative.  ALLERGIES: Allergies  Allergen Reactions   Penicillins Rash    As a child     HOME MEDICATIONS: Outpatient Medications Prior to Visit  Medication Sig Dispense Refill   acetaminophen  (TYLENOL ) 500 MG tablet Take 1,000 mg by mouth at bedtime.     atorvastatin  (LIPITOR) 10 MG tablet Take 1 tablet (10 mg total) by mouth daily. 90 tablet 3   escitalopram  (LEXAPRO ) 10 MG tablet Take 1 tablet (10 mg total) by mouth daily. 90 tablet 3   ketotifen  (ZADITOR ) 0.035 % ophthalmic solution Place 1 drop into both eyes 2 (two) times daily as needed.     Lacosamide  150 MG TABS Take 1 tablet (150 mg total) by mouth 2 (two) times daily. 60 tablet 5   No facility-administered medications prior to visit.    PAST MEDICAL HISTORY: Past Medical History:  Diagnosis Date   Anxiety    Arthritis    BOTH HANDS   BPH (benign prostatic  hypertrophy)    Elevated PSA    History of kidney stones    History of methicillin resistant staphylococcus aureus (MRSA) 2008   HLD (hyperlipidemia)    Nocturia    Over weight    Patellar clunk syndrome, right    Pulmonary nodule    Rotator cuff tear    Seizures (HCC) 2017   last seizure was 2017    PAST SURGICAL HISTORY: Past Surgical History:  Procedure Laterality Date   COLONOSCOPY  2014?   INGUINAL HERNIA REPAIR Left 02/17/2015   Procedure: LAPAROSCOPIC INGUINAL HERNIA;  Surgeon: Louanne KANDICE Muse, MD;  Location: [facility];  Service: General;  Laterality: Left;   KNEE ARTHROSCOPY Right 02/14/2023    Procedure: Right knee arthroscopy with scar tissue resection;  Surgeon: Lorelle Hussar, MD;  Location: [facility];  Service: Orthopedics;  Laterality: Right;   KNEE ARTHROSCOPY WITH MEDIAL MENISECTOMY Right 01/13/2019   Procedure: KNEE ARTHROSCOPY WITH MEDIAL MENISECTOMY;  Surgeon: Marchia Drivers, MD;  Location: [facility];  Service: Orthopedics;  Laterality: Right;   LITHOTRIPSY     x 2   SHOULDER ARTHROSCOPY WITH OPEN ROTATOR CUFF REPAIR Left 09/23/2014   Procedure: SHOULDER ARTHROSCOPY , debridement, decompression, WITH  mini OPEN ROTATOR CUFF REPAIR;  Surgeon: Norleen JINNY Maltos, MD;  Location: [facility];  Service: Orthopedics;  Laterality: Left;   SHOULDER SURGERY Bilateral    rotator cuff repair   TOTAL KNEE ARTHROPLASTY Right 05/09/2021   Procedure: TOTAL KNEE ARTHROPLASTY;  Surgeon: Marchia Drivers, MD;  Location: [facility];  Service: Orthopedics;  Laterality: Right;   UMBILICAL HERNIA REPAIR N/A 02/17/2015   Procedure: LAPAROSCOPIC UMBILICAL HERNIA;  Surgeon: Louanne KANDICE Muse, MD;  Location: [facility];  Service: General;  Laterality: N/A;    FAMILY HISTORY: Family History  Problem Relation Age of Onset   COPD Mother    Lupus Mother    Prostate cancer Father    Colon cancer Paternal Grandmother    Kidney disease Paternal Uncle    Kidney cancer Paternal Uncle     SOCIAL HISTORY: Social History   Socioeconomic History   Marital status: Married    Spouse name: Elvie   Number of children: 2   Years of education: 14   Highest education level: Not on file  Occupational History   Occupation: Engineer, Structural   Tobacco Use   Smoking status: Never   Smokeless tobacco: Former    Types: Chew    Quit date: 05/07/2004  Vaping Use   Vaping status: Never Used  Substance and Sexual Activity   Alcohol use: Yes    Comment: occ   Drug use: No   Sexual activity: Not on file  Other Topics Concern   Not on file  Social History Narrative   Married 1997, 2 children   Ambidextrous    Associate's degree   [employer] Sales Professional, 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   Overall Financial Resource Strain (CARDIA)    Difficulty of Paying Living Expenses: Not very hard  Food Insecurity: No Food Insecurity (07/02/2023)   Received from [facility]   Hunger Vital Sign    Within the past 12 months, you worried that your food would run out before you got the money to buy more.: Never true    Within the past 12 months, the food you bought  just didn't last and you didn't have money to get more.: Never true  Transportation Needs: No Transportation Needs (07/02/2023)   Received from [facility] - Transportation    In the past 12 months, has lack of transportation kept you from medical appointments or from getting medications?: No    Lack of Transportation (Non-Medical): No  Physical Activity: Not on file  Stress: Not on file  Social Connections: Not on file  Intimate Partner Violence: Not on file      PHYSICAL EXAM Generalized: Well developed, in no acute distress   Neurological examination  Mentation: Alert oriented to time, place, history taking. Follows all commands speech and language fluent Cranial nerve II-XII: Facial symmetry noted  DIAGNOSTIC DATA (LABS, IMAGING, TESTING) - I reviewed patient records, labs, notes, testing and imaging myself where available.  Lab Results  Component Value Date   WBC 4.6 09/20/2023   HGB 13.9 09/20/2023   HCT 41.9 09/20/2023   MCV 90.7 09/20/2023   PLT 207.0 09/20/2023      Component Value Date/Time   NA 140 09/20/2023 0911   K 4.3 09/20/2023 0911   CL 105 09/20/2023 0911   CO2 27 09/20/2023 0911   GLUCOSE 96 09/20/2023 0911   BUN 17 09/20/2023 0911   CREATININE 0.75 09/20/2023 0911   CALCIUM  9.1 09/20/2023 0911   PROT 7.0 09/20/2023  0911   ALBUMIN 4.2 09/20/2023 0911   AST 17 09/20/2023 0911   ALT 11 09/20/2023 0911   ALKPHOS 77 09/20/2023 0911   BILITOT 0.6 09/20/2023 0911   GFRNONAA >60 05/10/2021 0452   GFRAA >60 01/09/2019 0810   Lab Results  Component Value Date   CHOL 148 09/20/2023   HDL 44.90 09/20/2023   LDLCALC 79 09/20/2023   TRIG 120.0 09/20/2023   CHOLHDL 3 09/20/2023      ASSESSMENT AND PLAN 63 y.o. year old male  has a past medical history of Anxiety, Arthritis, BPH (benign prostatic hypertrophy), Elevated PSA, History of kidney stones, History of methicillin resistant staphylococcus aureus (MRSA) (2008), HLD (hyperlipidemia), Nocturia, Over weight, Patellar clunk syndrome, right, Pulmonary nodule, Rotator cuff tear, and Seizures (HCC) (2017). here with:  1.  Seizures  Continue lacosamide  150 mg twice a day Advised that when he has 1 refill left on his bottle he should notify me so I can send in a refill to prevent him from going without medication. Advised if he has any seizure events he should let us  know Follow-up in 1 year or sooner if needed    [name], MSN, NP-C 12/24/2023, 3:04 PM [facility] [address] [place], [place] [address] [phone]  The patient's condition requires frequent monitoring and adjustments in the treatment plan, reflecting the ongoing complexity of care.  This provider is the continuing focal point for all needed services for this condition.

## 2024-01-15 ENCOUNTER — Inpatient Hospital Stay: Admission: RE | Admit: 2024-01-15 | Discharge: 2024-01-15 | Attending: Family Medicine | Admitting: Family Medicine

## 2024-01-15 DIAGNOSIS — R911 Solitary pulmonary nodule: Secondary | ICD-10-CM

## 2024-01-22 ENCOUNTER — Ambulatory Visit: Payer: Self-pay | Admitting: Family Medicine

## 2024-01-22 DIAGNOSIS — R0683 Snoring: Secondary | ICD-10-CM

## 2024-02-02 NOTE — Telephone Encounter (Signed)
 Noted. Thanks.

## 2024-02-26 ENCOUNTER — Ambulatory Visit: Admitting: Internal Medicine

## 2024-02-26 ENCOUNTER — Encounter: Payer: Self-pay | Admitting: Internal Medicine

## 2024-02-26 VITALS — BP 126/80 | HR 74 | Temp 99.2°F | Ht 69.0 in | Wt 230.6 lb

## 2024-02-26 DIAGNOSIS — J4520 Mild intermittent asthma, uncomplicated: Secondary | ICD-10-CM

## 2024-02-26 DIAGNOSIS — G4733 Obstructive sleep apnea (adult) (pediatric): Secondary | ICD-10-CM

## 2024-02-26 LAB — NITRIC OXIDE: Nitric Oxide: 13

## 2024-02-26 NOTE — Progress Notes (Signed)
 "  Name: Mark Hernandez. MRN: [id number] DOB: 1960/11/06    CHIEF COMPLAINT:  ASSESSMENT OF SLEEP APNEA EXCESSIVE DAYTIME SLEEPINESS   HISTORY OF PRESENT ILLNESS: Patient is seen today for problems and issues with sleep related to excessive daytime sleepiness Patient  has been having sleep problems for many years Patient has been having excessive daytime sleepiness for a long time Patient has been having extreme fatigue and tiredness, lack of energy + morning headaches + Nonrefreshing sleep  Discussed sleep data and reviewed with patient.  Encouraged proper weight management.  Discussed driving precautions and its relationship with hypersomnolence.  Discussed operating dangerous equipment and its relationship with hypersomnolence.  Discussed sleep hygiene, and benefits of a fixed sleep waked time.  The importance of getting eight or more hours of sleep discussed with patient.  Discussed limiting the use of the computer and television before bedtime.  Decrease naps during the day, so night time sleep will become enhanced.  Limit caffeine, and sleep deprivation.  HTN, stroke, and heart failure are potential risk factors.      02/26/2024    8:00 AM  Results of the Ep[facility] flowsheet  Sitting and reading 2  Watching TV 2  Sitting, inactive in a public place (e.g. a theatre or a meeting) 2  As a passenger in a car for an hour without a break 1  Lying down to rest in the afternoon when circumstances permit 2  Sitting and talking to someone 0  Sitting quietly after a lunch without alcohol 2  In a car, while stopped for a few minutes in traffic 0  Total score 11   Patient also having signs symptoms of reactive airways disease consistent with asthma Patient very sensitive to cold air grass pollen bleach perfumes colognes At this time his reactive airways disease is under control with avoidance of triggers No maintenance therapy needed at this time   Assessment of ASTHMA   Lab  Results  Component Value Date   NITRICOXIDE 13 02/26/2024   Elevated exhaled Nitric oxide testing is NOT highly consistent with type II inflammation    Patient also noted to have history of bilateral pulmonary nodules Subcentimeter benign in nature no significant changes on recent CT scan of his chest   CT CHEST 2025 1. Stable 4 mm noncalcified pulmonary nodule in the right costophrenic sulcus and 4 mm subpleural pulmonary nodule in the right middle lobe, both safely considered benign, with no new pulmonary nodules. 2. Stable central pulmonary artery enlargement, consistent with pulmonary arterial hypertension.    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  has a past surgical history that includes Lithotripsy; Shoulder surgery (Bilateral); Shoulder arthroscopy with open rotator cuff repair (Left, 09/23/2014); Colonoscopy (2014?); Umbilical hernia repair (N/A, 02/17/2015); Inguinal hernia repair (Left, 02/17/2015); Knee arthroscopy with medial menisectomy (Right, 01/13/2019); Total knee arthroplasty (Right, 05/09/2021); and Knee arthroscopy (Right, 02/14/2023). Prior to Admission medications  Medication Sig Start Date End Date Taking? Authorizing Provider  acetaminophen  (TYLENOL ) 500 MG tablet Take 1,000 mg by mouth at bedtime.    [provider]  atorvastatin  (LIPITOR) 10 MG tablet Take 1 tablet (10 mg total) by mouth  daily. 09/24/23   Cleatus Arlyss RAMAN, MD  escitalopram  (LEXAPRO ) 10 MG tablet Take 1 tablet (10 mg total) by mouth daily. 09/24/23   Cleatus Arlyss RAMAN, MD   ketotifen  (ZADITOR ) 0.035 % ophthalmic solution Place 1 drop into both eyes 2 (two) times daily as needed. 09/24/23   Cleatus Arlyss RAMAN, MD  Lacosamide  150 MG TABS Take 1 tablet (150 mg total) by mouth 2 (two) times daily. 12/19/23   Sherryl Bouchard, NP   Allergies[1]  FAMILY HISTORY:  family history includes COPD in his mother; Colon cancer in his paternal grandmother; Kidney cancer in his paternal uncle; Kidney disease in his paternal uncle; Lupus in his mother; Prostate cancer in his father. SOCIAL HISTORY:  reports that he has never smoked. He quit smokeless tobacco use about 19 years ago.  His smokeless tobacco use included chew. He reports current alcohol use. He reports that he does not use drugs.   BP 126/80   Pulse 74   Temp 99.2 F (37.3 C)   Ht 5' 9 (1.753 m)   Wt 230 lb 9.6 oz (104.6 kg)   SpO2 95%   BMI 34.05 kg/m      Review of Systems: Gen:  Denies  fever, sweats, chills weight loss  HEENT: Denies blurred vision, double vision, ear pain, eye pain, hearing loss, nose bleeds, sore throat Cardiac:  No dizziness, chest pain or heaviness, chest tightness,edema, No JVD Resp:   +cough, -sputum production, -shortness of breath,-wheezing, -hemoptysis,  Other:  All other systems negative   Physical Examination:   General Appearance: No distress  EYES PERRLA, EOM intact.   NECK Supple, No JVD Pulmonary: normal breath sounds, No wheezing.  CardiovascularNormal S1,S2.  No m/r/g.   Abdomen: Benign, Soft, non-tender. Neurology UE/LE 5/5 strength, no focal deficits Ext pulses intact, cap refill intact ALL OTHER ROS ARE NEGATIVE     ASSESSMENT AND PLAN SYNOPSIS 64 year old pleasant white male seen today for signs and symptoms of excessive daytime sleepiness with probable underlying diagnosis of obstructive sleep apnea in the setting of obesity and deconditioned state, with underlying reactive airways disease consistent with asthma   Assessment of OSA Recommend  home sleep study to assess for sleep apnea  Underlying reactive airway disease asthma Likely related to OSA and obesity Avoid Allergens and Irritants Avoid secondhand smoke Avoid SICK contacts Recommend  Masking  when appropriate Recommend Keep up-to-date with vaccinations No maintenance therapy at this time   Obesity -recommend significant weight loss -recommend changing diet  Deconditioned state -Recommend increased daily activity and exercise  Subcentimeter bilateral nodules Benign in nature   MEDICATION ADJUSTMENTS/LABS AND TESTS ORDERED: Recommend Sleep Study Recommend weight loss Avoid allergens   CURRENT MEDICATIONS REVIEWED AT LENGTH WITH PATIENT TODAY   Patient  satisfied with Plan of action and management. All questions answered   Follow up    I spent a total of 63 minutes dedicated to the care of this patient on the date of this encounter to include pre-visit review of records, face-to-face time with the patient discussing conditions above, post visit ordering of testing, clinical documentation with the electronic health record, making appropriate referrals as documented, and communicating necessary information to the patient's healthcare team.     Nickolas Alm Cellar, M.D.  [facility]  Medical Director [facility]         [1]  Allergies Allergen Reactions   Penicillins Rash    As a child    "

## 2024-02-26 NOTE — Patient Instructions (Addendum)
" ° ° ° ° ° ° °  Recommend Home Sleep Study.  Take Zyrtec/Claritin as needed for allergies management. "

## 2024-03-27 ENCOUNTER — Other Ambulatory Visit

## 2024-04-03 ENCOUNTER — Ambulatory Visit: Admitting: Urology

## 2024-05-26 ENCOUNTER — Ambulatory Visit: Admitting: Internal Medicine

## 2024-09-17 ENCOUNTER — Other Ambulatory Visit

## 2024-09-24 ENCOUNTER — Encounter: Admitting: Family Medicine

## 2024-12-22 ENCOUNTER — Telehealth: Admitting: Adult Health
